# Patient Record
Sex: Female | Born: 1960
Health system: Southern US, Community
[De-identification: ages and names within clinical notes are randomized; demographics above are authoritative.]

## PROBLEM LIST (undated history)

## (undated) DIAGNOSIS — G709 Myoneural disorder, unspecified: Secondary | ICD-10-CM

## (undated) DIAGNOSIS — I10 Essential (primary) hypertension: Secondary | ICD-10-CM

## (undated) DIAGNOSIS — T7840XA Allergy, unspecified, initial encounter: Secondary | ICD-10-CM

## (undated) DIAGNOSIS — L509 Urticaria, unspecified: Secondary | ICD-10-CM

## (undated) DIAGNOSIS — N189 Chronic kidney disease, unspecified: Secondary | ICD-10-CM

## (undated) DIAGNOSIS — M1990 Unspecified osteoarthritis, unspecified site: Secondary | ICD-10-CM

## (undated) DIAGNOSIS — F419 Anxiety disorder, unspecified: Secondary | ICD-10-CM

## (undated) DIAGNOSIS — R55 Syncope and collapse: Secondary | ICD-10-CM

## (undated) DIAGNOSIS — Z87442 Personal history of urinary calculi: Secondary | ICD-10-CM

## (undated) DIAGNOSIS — G479 Sleep disorder, unspecified: Secondary | ICD-10-CM

## (undated) DIAGNOSIS — E669 Obesity, unspecified: Secondary | ICD-10-CM

## (undated) DIAGNOSIS — N200 Calculus of kidney: Secondary | ICD-10-CM

## (undated) DIAGNOSIS — Z9289 Personal history of other medical treatment: Secondary | ICD-10-CM

## (undated) DIAGNOSIS — F32A Depression, unspecified: Secondary | ICD-10-CM

## (undated) DIAGNOSIS — Z8669 Personal history of other diseases of the nervous system and sense organs: Secondary | ICD-10-CM

## (undated) DIAGNOSIS — K635 Polyp of colon: Secondary | ICD-10-CM

## (undated) HISTORY — DX: Syncope and collapse: R55

## (undated) HISTORY — DX: Polyp of colon: K63.5

## (undated) HISTORY — DX: Essential (primary) hypertension: I10

## (undated) HISTORY — DX: Obesity, unspecified: E66.9

## (undated) HISTORY — PX: CHOLECYSTECTOMY: SHX55

## (undated) HISTORY — DX: Myoneural disorder, unspecified: G70.9

## (undated) HISTORY — DX: Calculus of kidney: N20.0

## (undated) HISTORY — DX: Unspecified osteoarthritis, unspecified site: M19.90

## (undated) HISTORY — PX: ABDOMINAL HYSTERECTOMY: SHX81

## (undated) HISTORY — DX: Depression, unspecified: F32.A

## (undated) HISTORY — PX: JOINT REPLACEMENT: SHX530

## (undated) HISTORY — DX: Allergy, unspecified, initial encounter: T78.40XA

---
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
## 1994-10-29 HISTORY — PX: PARTIAL HYSTERECTOMY: SHX80

## 1997-04-30 DIAGNOSIS — Z87442 Personal history of urinary calculi: Secondary | ICD-10-CM

## 1997-04-30 HISTORY — DX: Personal history of urinary calculi: Z87.442

## 2001-08-20 ENCOUNTER — Other Ambulatory Visit: Admission: RE | Admit: 2001-08-20 | Discharge: 2001-08-20 | Payer: Self-pay | Admitting: Unknown Physician Specialty

## 2005-03-01 ENCOUNTER — Emergency Department (HOSPITAL_COMMUNITY): Admission: EM | Admit: 2005-03-01 | Discharge: 2005-03-02 | Payer: Self-pay | Admitting: *Deleted

## 2005-11-13 ENCOUNTER — Encounter: Admission: RE | Admit: 2005-11-13 | Discharge: 2005-11-13 | Payer: Self-pay | Admitting: Internal Medicine

## 2006-04-30 DIAGNOSIS — F419 Anxiety disorder, unspecified: Secondary | ICD-10-CM

## 2006-04-30 HISTORY — DX: Anxiety disorder, unspecified: F41.9

## 2008-04-30 DIAGNOSIS — Z9289 Personal history of other medical treatment: Secondary | ICD-10-CM

## 2008-04-30 HISTORY — DX: Personal history of other medical treatment: Z92.89

## 2008-12-29 HISTORY — PX: KNEE ARTHROSCOPY: SUR90

## 2012-08-07 ENCOUNTER — Other Ambulatory Visit: Payer: Self-pay | Admitting: *Deleted

## 2012-08-07 MED ORDER — LISINOPRIL 20 MG PO TABS: ORAL_TABLET | ORAL | Status: DC

## 2012-08-07 MED ORDER — ATORVASTATIN CALCIUM 20 MG PO TABS: ORAL_TABLET | ORAL | Status: DC

## 2013-07-10 ENCOUNTER — Ambulatory Visit (INDEPENDENT_AMBULATORY_CARE_PROVIDER_SITE_OTHER): Payer: 59 | Admitting: Surgery

## 2013-07-10 ENCOUNTER — Encounter (INDEPENDENT_AMBULATORY_CARE_PROVIDER_SITE_OTHER): Payer: Self-pay | Admitting: Surgery

## 2013-07-10 NOTE — Addendum Note (Signed)
Addended by: Brennan BaileyBROOKS, Guneet Delpino on: 07/10/2013 03:28 PM   Modules accepted: Orders

## 2013-07-10 NOTE — Patient Instructions (Signed)
Sleeve Gastrectomy A sleeve gastrectomy is a surgery in which a large portion of the stomach is removed. After the surgery, the stomach will be a narrow tube about the size of a banana. This surgery is performed to help a person lose weight. The person loses weight because the reduced size of the stomach restricts the amount of food that the person can eat. The stomach will hold much less food than before the surgery. Also, the part of the stomach that is removed produces a hormone that causes hunger.  This surgery is done for people who have morbid obesity, defined as a body mass index (BMI) greater than 40. BMI is an estimate of body fat and is calculated from the height and weight of a person. This surgery may also be done for people with a BMI between 35 and 40 if they have other diseases, such as type 2 diabetes mellitus, obstructive sleep apnea, or heart and lung disorders (cardiopulmonary diseases).  LET YOUR HEALTH CARE PROVIDER KNOW ABOUT:  Any allergies you have.   All medicines you are taking, including vitamins, herbs, eyedrops, creams, and over-the-counter medicines.   Use of steroids (by mouth or creams).   Previous problems you or members of your family have had with the use of anesthetics.   Any blood disorders you have.   Previous surgeries you have had.   Possibility of pregnancy, if this applies.   Other health problems you have. RISKS AND COMPLICATIONS Generally, sleeve gastrectomy is a safe procedure. However, as with any procedure, complications can occur. Possible complications include:  Infection.  Bleeding.  Blood clots.  Damage to other organs or tissue.  Leakage of fluid from the stomach into the abdominal cavity (rare). BEFORE THE PROCEDURE  You may need to have blood tests and imaging tests (such as X-rays or ultrasonography) done before the day of surgery. A test to evaluate your esophagus and how it moves (esophageal manometry) may also be  done.  You may be placed on a liquid diet 2 3 weeks before the surgery.  Ask your health care provider about changing or stopping your regular medicines.  Do not eat or drink anything for at least 8 hours before the procedure.   Make plans to have someone drive you home after your hospital stay. Also arrange for someone to help you with activities during recovery. PROCEDURE  A laparoscopic technique is usually used for this surgery:  You will be given medicine to make you sleep through the procedure (general anesthetic). This medicine will be given through an intravenous (IV) access tube that is put into one of your veins.  Once you are asleep, your abdomen will be cleaned and sterilized.  Several small incisions will be made in your abdomen.  Your abdomen will be filled with air so that it expands. This gives the surgeon more room to operate and makes your organs easier to see.  A thin, lighted tube with a tiny camera on the end (laparoscope) is put through a small incision in your abdomen. The camera on the laparoscope sends a picture to a TV screen in the operating room. This gives the surgeon a good view inside the abdomen.  Hollow tubes are put through the other small incisions in your abdomen. The tools needed for the procedure are put through these tubes.  The surgeon uses staples to divide part of the stomach and then removes it through one of the incisions.  The remaining stomach may be reinforced using   stitches or surgical glue or both to prevent leakage of the stomach contents. A small tube (drain) may be placed through one of the incisions to allow extra fluid to flow from the area.  The incisions are closed with stitches, staples, or glue. AFTER THE PROCEDURE  You will be monitored closely in a recovery area. Once the anesthetic has worn off, you will likely be moved to a regular hospital room.  You will be given medicine for pain and nausea.   You may have a drain  from one of the incisions in your abdomen. If a drain is used, it may stay in place after you go home from the hospital and be removed at a follow-up appointment.   You will be encouraged to walk around several times a day. This helps prevent blood clots.  You will be started on a liquid diet the first day after your surgery. Sometimes a test is done to check for leaking before you can eat.  You will be urged to cough and do deep breathing exercises. This helps prevent a lung infection after a surgery.  You will likely need to stay in the hospital for a few days.  Document Released: 02/11/2009 Document Revised: 12/17/2012 Document Reviewed: 08/29/2012 [facility] Patient Information 2014 [employer], [place]. Laparoscopic Gastric Band Surgery Care After These instructions give you information on caring for yourself after your procedure. Your doctor may also give you more specific instructions. Call your doctor if you have any problems or questions after your procedure. HOME CARE   Take walks throughout the day. Do not sit for longer than 1 hour while awake for 4 to 6 weeks.  You may shower 2 days after surgery. Pat the surgery cuts (incisions) dry. Do not rub the surgery cuts.  Do your coughing and deep breathing exercises.  Do not lift, push, or pull anything heavy until your doctor says it is okay.  Only take medicines as told by your doctor. Do not drive while taking pain medicine.  Drink plenty of fluids to keep your pee (urine) clear or pale yellow.  Stay on a liquid diet as long as your doctor tells you to.  Do not drink caffeine for 1 month.  Change bandages (dressings) as told by your doctor.  Check your surgery cuts for redness, pufffiness (swelling), abnormal coloring, fluid, or bleeding.  Follow your doctor's advice about vitamin and protein needs after surgery. GET HELP RIGHT AWAY IF:  You feel sick to your stomach (nauseous) and throw up (vomit).  You have pain and  discomfort with swallowing.  You develop shortness of breath or difficulty breathing.  You have pain, puffiness, or feel warmth on your lower body.  You have very bad calf pain or pain not relieved by medicine.  You have a temperature by mouth above 102 F (38.9 C).  Your surgery cuts look red, puffy, or they leak fluid.  Your poop (stool) is black, tarry, or dark red.  You have chills.  You have chest pain.  You feel confused.  You have slurred speech.  You feel lightheaded when standing.  You suddenly feel weak.  You have any questions or concerns. MAKE SURE YOU:   Understand these instructions.  Will watch your condition.  Will get help right away if you are not doing well or get worse. Document Released: 05/19/2010 Document Revised: 08/11/2012 Document Reviewed: 05/19/2010 [facility] Patient Information 2014 [place], [place].

## 2013-07-10 NOTE — Progress Notes (Signed)
Chief Complaint:  Morbid obesity and back pain with a BMI of 52  History of Present Illness:  [name] [name] is an 53 y.o. female who was referred by Dr. Shelle IronBeane and Dr. Herb Graysammy Spear for bariatric surgery.  She's been doing our seminars and really had medical minor a white count of surgery she wanted to pursue. She does have arthritis and is on aspirin. Because of that I would not consider her a good candidate for a Roux-en-Y gastric bypass. We therefore concentrated more on sleeves and LAD bands. She has a friend that a lap band and he did well.  For the moment she will be considering these and will let us know what route she would like to pursue. In the meantime a 1 to obtain the requisite preoperative lab tests.  Past Medical History  Diagnosis Date  . Arthritis   . Hypertension   . Neuromuscular disorder     bacl pain    Past Surgical History  Procedure Laterality Date  . Knee arthroscopy  12/2008    left knee  . Partial hysterectomy  10/1994  . Thumb surgery  03/1975    Current Outpatient Prescriptions  Medication Sig Dispense Refill  . aspirin 81 MG chewable tablet Chew by mouth daily.      Marland Kitchen. OVER THE COUNTER MEDICATION        No current facility-administered medications for this visit.   [place]illins Family History  Problem Relation Age of Onset  . Stroke Father    Social History:   reports that she has never smoked. She does not have any smokeless tobacco history on file. She reports that she does not drink alcohol or use illicit drugs.   REVIEW OF SYSTEMS - PERTINENT POSITIVES ONLY: No prior abdominal surgery. No history of DVT route systems was not checked for any thinks was all negative. She does have hypertension and some back issues  Physical Exam:   Blood pressure 134/82, pulse 84, temperature 98.1 F (36.7 C), temperature source Oral, resp. rate 18, height 5' (1.524 m), weight 268 lb 12.8 oz (121.927 kg). Body mass index is 52.5 kg/(m^2).  Gen:  WDWN white female  NAD  Neurological: Alert and oriented to person, place, and time. Motor and sensory function is grossly intact  Head: Normocephalic and atraumatic.  Eyes: Conjunctivae are normal. Pupils are equal, round, and reactive to light. No scleral icterus.  Neck: Normal range of motion. Neck supple. No tracheal deviation or thyromegaly present.  Cardiovascular:  SR without murmurs or gallops.  No carotid bruits Respiratory: Effort normal.  No respiratory distress. No chest wall tenderness. Breath sounds normal.  No wheezes, rales or rhonchi.  Abdomen:  nontender obese GU: Musculoskeletal: Normal range of motion. Extremities are nontender. No cyanosis, edema or clubbing noted Lymphadenopathy: No cervical, preauricular, postauricular or axillary adenopathy is present Skin: Skin is warm and dry. No rash noted. No diaphoresis. No erythema. No pallor. Pscyh: Normal mood and affect. Behavior is normal. Judgment and thought content normal.   LABORATORY RESULTS: No results found for this or any previous visit (from the past 48 hour(s)).  RADIOLOGY RESULTS: No results found.  Problem List: There are no active problems to display for this patient.   Assessment & Plan: Morbid obesity BMI 52. We'll begin workup but will give her consent forms were both lap band and sleeve gastrectomy.    Matt B. Daphine DeutscherMartin, MD, [facility] HospitalFACS  [facility] [phone] [phone]  07/10/2013 3:16 PM

## 2013-07-15 LAB — CBC
HCT: 41.8 % (ref 36.0–46.0)
HEMOGLOBIN: 13.9 g/dL (ref 12.0–15.0)
MCH: 29.4 pg (ref 26.0–34.0)
MCHC: 33.3 g/dL (ref 30.0–36.0)
MCV: 88.4 fL (ref 78.0–100.0)
Platelets: 339 10*3/uL (ref 150–400)
RBC: 4.73 MIL/uL (ref 3.87–5.11)
RDW: 14.4 % (ref 11.5–15.5)
WBC: 8.1 10*3/uL (ref 4.0–10.5)

## 2013-07-15 LAB — COMPREHENSIVE METABOLIC PANEL
ALBUMIN: 4 g/dL (ref 3.5–5.2)
ALT: 24 U/L (ref 0–35)
AST: 23 U/L (ref 0–37)
Alkaline Phosphatase: 104 U/L (ref 39–117)
BILIRUBIN TOTAL: 0.6 mg/dL (ref 0.2–1.2)
BUN: 7 mg/dL (ref 6–23)
CHLORIDE: 106 meq/L (ref 96–112)
CO2: 26 meq/L (ref 19–32)
CREATININE: 0.65 mg/dL (ref 0.50–1.10)
Calcium: 8.5 mg/dL (ref 8.4–10.5)
Glucose, Bld: 78 mg/dL (ref 70–99)
POTASSIUM: 3.9 meq/L (ref 3.5–5.3)
SODIUM: 140 meq/L (ref 135–145)
TOTAL PROTEIN: 6.2 g/dL (ref 6.0–8.3)

## 2013-07-15 LAB — TSH: TSH: 2.021 u[IU]/mL (ref 0.350–4.500)

## 2013-07-15 LAB — HEMOGLOBIN A1C
HEMOGLOBIN A1C: 5.6 % (ref <5.7)
MEAN PLASMA GLUCOSE: 114 mg/dL (ref <117)

## 2013-07-15 LAB — T4: T4 TOTAL: 11.9 ug/dL (ref 5.0–12.5)

## 2013-07-16 LAB — PREGNANCY, URINE: PREG TEST UR: NEGATIVE

## 2013-08-05 ENCOUNTER — Ambulatory Visit (HOSPITAL_COMMUNITY)
Admission: RE | Admit: 2013-08-05 | Discharge: 2013-08-05 | Disposition: A | Discharge status: Home / Self Care | Payer: 59 | Source: Ambulatory Visit | Attending: Surgery | Admitting: Surgery

## 2013-08-05 ENCOUNTER — Other Ambulatory Visit: Payer: Self-pay

## 2013-08-05 DIAGNOSIS — K219 Gastro-esophageal reflux disease without esophagitis: Secondary | ICD-10-CM | POA: Insufficient documentation

## 2013-08-05 DIAGNOSIS — I10 Essential (primary) hypertension: Secondary | ICD-10-CM | POA: Insufficient documentation

## 2013-08-05 DIAGNOSIS — K449 Diaphragmatic hernia without obstruction or gangrene: Secondary | ICD-10-CM | POA: Insufficient documentation

## 2013-08-05 DIAGNOSIS — K224 Dyskinesia of esophagus: Secondary | ICD-10-CM | POA: Insufficient documentation

## 2013-08-05 DIAGNOSIS — G709 Myoneural disorder, unspecified: Secondary | ICD-10-CM | POA: Insufficient documentation

## 2013-08-05 DIAGNOSIS — M129 Arthropathy, unspecified: Secondary | ICD-10-CM | POA: Insufficient documentation

## 2013-08-05 DIAGNOSIS — Z13820 Encounter for screening for osteoporosis: Secondary | ICD-10-CM | POA: Insufficient documentation

## 2013-08-05 DIAGNOSIS — Z6841 Body Mass Index (BMI) 40.0 and over, adult: Secondary | ICD-10-CM | POA: Insufficient documentation

## 2013-08-14 ENCOUNTER — Ambulatory Visit (HOSPITAL_BASED_OUTPATIENT_CLINIC_OR_DEPARTMENT_OTHER): Payer: 59 | Attending: Surgery | Admitting: Radiology

## 2013-08-14 VITALS — Ht 60.0 in | Wt 263.0 lb

## 2013-08-14 DIAGNOSIS — G4710 Hypersomnia, unspecified: Secondary | ICD-10-CM | POA: Insufficient documentation

## 2013-08-14 DIAGNOSIS — G4733 Obstructive sleep apnea (adult) (pediatric): Secondary | ICD-10-CM

## 2013-08-14 DIAGNOSIS — G4730 Sleep apnea, unspecified: Principal | ICD-10-CM

## 2013-08-16 DIAGNOSIS — G4730 Sleep apnea, unspecified: Secondary | ICD-10-CM

## 2013-08-16 DIAGNOSIS — G4710 Hypersomnia, unspecified: Secondary | ICD-10-CM

## 2013-08-16 NOTE — Sleep Study (Signed)
   NAME: [name] HakeConnie S [name] DATE OF BIRTH:  02/27/1961 MEDICAL RECORD NUMBER [id number]  LOCATION: [facility]  PHYSICIAN: Jeanenne Licea D Muhammadali Ries  DATE OF STUDY: 08/14/2013  SLEEP STUDY TYPE: Nocturnal Polysomnogram               REFERRING PHYSICIAN: Valarie MerinoMartin, Matthew B, MD  INDICATION FOR STUDY: Hypersomnia with sleep apnea  EPWORTH SLEEPINESS SCORE:   5/24 HEIGHT: 5' (152.4 cm)  WEIGHT: 263 lb (119.296 kg)    Body mass index is 51.36 kg/(m^2).  NECK SIZE: 16 in.  MEDICATIONS: Charted for review  SLEEP ARCHITECTURE: Total sleep time 352.5 minutes with sleep efficiency 87.3%. Stage I was 3.7%, stage II 61.8%, stage III 4.1%, REM 30.4% of total sleep time. Sleep latency 31 minutes, REM latency 97 minutes, awake after sleep onset 22 minutes, arousal index 9.2. Bedtime medication: None  RESPIRATORY DATA: Apnea hypotony index (AHI) 6.5 per hour. 38 total events scored including 2 obstructive apneas, 14 central apneas, 22 hypopneas. Events were not positional. REM AHI 10.7 per hour. There were not enough early events to meet protocol requirements for split CPAP titration.  OXYGEN DATA: Moderate snoring with oxygen desaturation to a nadir of 90% and mean oxygen saturation through the study of 96.2% on room air.  CARDIAC DATA: Sinus rhythm  MOVEMENT/PARASOMNIA: A few incidental limb jerks were noted with little effect on sleep. No bathroom trips  IMPRESSION/ RECOMMENDATION:   1) Mild obstructive and central sleep apnea/hypopneas syndrome, AHI 6.5 per hour. Events were not positional. REM AHI 10.7 per hour. Moderate snoring with oxygen desaturation to a nadir of 90% and mean oxygen saturation through the study of 96.2% on room air. 2) There were not enough early events to meet protocol requirements for split protocol CPAP titration on the study night. Weight loss surgery may be therapeutic, since that is being considered.  Signed Jetty Duhamellinton Brennden Masten M.D. Waymon Budgelinton D Keyante Durio Diplomate,  Biomedical engineerAmerican Board of Sleep Medicine  ELECTRONICALLY SIGNED ON:  08/16/2013, 2:16 PM [facility] PH: [phone]   FX: [phone] ACCREDITED BY THE AMERICAN ACADEMY OF SLEEP MEDICINE

## 2013-08-24 ENCOUNTER — Ambulatory Visit (INDEPENDENT_AMBULATORY_CARE_PROVIDER_SITE_OTHER): Payer: 59 | Admitting: Internal Medicine

## 2013-08-24 ENCOUNTER — Encounter: Payer: Self-pay | Admitting: Internal Medicine

## 2013-08-24 VITALS — BP 140/80 | HR 104 | Ht 60.0 in | Wt 267.0 lb

## 2013-08-24 DIAGNOSIS — G4733 Obstructive sleep apnea (adult) (pediatric): Secondary | ICD-10-CM

## 2013-08-24 NOTE — Progress Notes (Signed)
08/25/13- 53 yoF never smoker COMPLAINS OF:  Restless throughout the night.  Epworth score #3 NPSG 08/14/13- AHI 6.5/ hr. Weight 263 lbs. Being evaluated for bariatric surgery. Denies being told of significant snore, or noticing daytime sleepiness. Sleeps in bed or sometimes a recliner because of excursion back. Has broken nose twice, nonobstructed. No ENT surgery, Cardiopulmonary disease except hypertension, or thyroid problems known. Bedtime between 8 and 9 PM, sleep latency 30-45 minutes, waking 2-3 times before up at 4 AM. Weight gain 30 pounds in 2 years.  Prior to Admission medications   Medication Sig Start Date End Date Taking? Authorizing Provider  aspirin 81 MG chewable tablet Chew by mouth daily.   Yes Historical Provider, MD  cetirizine (ZYRTEC) 10 MG tablet Take 10 mg by mouth 2 (two) times daily.   Yes Historical Provider, MD  diltiazem (DILACOR XR) 180 MG 24 hr capsule Take 180 mg by mouth daily.   Yes Historical Provider, MD   Past Medical History  Diagnosis Date  . Arthritis   . Hypertension   . Neuromuscular disorder     bacl pain   Past Surgical History  Procedure Laterality Date  . Knee arthroscopy  12/2008    left knee  . Partial hysterectomy  10/1994  . Thumb surgery  03/1975   Family History  Problem Relation Age of Onset  . Stroke Father   . Diabetes Other   . Hyperlipidemia Other   . Heart disease Other   . Hypertension Other   . Cancer Other    History   Social History  . Marital Status: Single    Spouse Name: N/A    Number of Children: N/A  . Years of Education: N/A   Occupational History  . Not on file.   Social History Main Topics  . Smoking status: Never Smoker   . Smokeless tobacco: Not on file  . Alcohol Use: No  . Drug Use: No  . Sexual Activity: Not on file   Other Topics Concern  . Not on file   Social History Narrative  . No narrative on file   ROS-see HPI Constitutional:   No-   weight loss, night sweats, fevers, chills,  fatigue, lassitude. HEENT:   No-  headaches, difficulty swallowing, tooth/dental problems, sore throat,       No-  sneezing, itching, ear ache, nasal congestion, post nasal drip,  CV:  No-   chest pain, orthopnea, PND, swelling in lower extremities, anasarca,                                  dizziness, palpitations Resp: No-   shortness of breath with exertion or at rest.              No-   productive cough,  No non-productive cough,  No- coughing up of blood.              No-   change in color of mucus.  No- wheezing.   Skin: No-   rash or lesions. GI:  No-   Heartburn,+ indigestion, abdominal pain, nausea, vomiting, diarrhea,                 change in bowel habits, loss of appetite GU: No-   dysuria, change in color of urine, no urgency or frequency.  No- flank pain. MS:  No-   +joint pain or swelling.  No- decreased range of  motion.  No- back pain. Neuro-     nothing unusual Psych:  No- change in mood or affect. No depression or anxiety.  No memory loss.  OBJ- Physical Exam General- Alert, Oriented, Affect-appropriate, Distress- none acute Skin- rash-none, lesions- none, excoriation- none Lymphadenopathy- none Head- atraumatic            Eyes- Gross vision intact, PERRLA, conjunctivae and secretions clear            Ears- Hearing, canals-normal            Nose- Clear, no-Septal dev, mucus, polyps, erosion, perforation             Throat- Mallampati II , mucosa clear , drainage- none, tonsils- atrophic Neck- flexible , trachea midline, no stridor , thyroid nl, carotid no bruit Chest - symmetrical excursion , unlabored           Heart/CV- RRR , no murmur , no gallop  , no rub, nl s1 s2                           - JVD- none , edema- none, stasis changes- none, varices- none           Lung- clear to P&A, wheeze- none, cough- none , dullness-none, rub- none           Chest wall-  Abd- tender-no, distended-no, bowel sounds-present, HSM- no Br/ Gen/ Rectal- Not done, not indicated Extrem-  cyanosis- none, clubbing, none, atrophy- none, strength- nl Neuro- grossly intact to observation

## 2013-08-24 NOTE — Patient Instructions (Signed)
You have very mild obstructive sleep apnea, stopping 5.6 times per hour.  It helps that you sleep off the flat of your back.  Weight loss can make all the difference for you nos, so good luck with that.  We will see how you are doing in about 6 months

## 2013-08-25 ENCOUNTER — Encounter: Payer: Self-pay | Admitting: Dietician

## 2013-08-25 ENCOUNTER — Encounter: Payer: 59 | Attending: Surgery | Admitting: Dietician

## 2013-08-25 DIAGNOSIS — Z713 Dietary counseling and surveillance: Secondary | ICD-10-CM | POA: Insufficient documentation

## 2013-08-25 DIAGNOSIS — Z01818 Encounter for other preprocedural examination: Secondary | ICD-10-CM | POA: Insufficient documentation

## 2013-08-25 DIAGNOSIS — Z6841 Body Mass Index (BMI) 40.0 and over, adult: Secondary | ICD-10-CM | POA: Insufficient documentation

## 2013-08-25 NOTE — Patient Instructions (Signed)
Patient to call the [facility] to enroll in Pre-Op and Post-Op Nutrition Education when surgery date is scheduled. 

## 2013-08-25 NOTE — Progress Notes (Signed)
  Pre-Op Assessment Visit:  Pre-Operative Gastric sleeve Surgery  Medical Nutrition Therapy:  Appt start time: 1015   End time:  1045.  Patient was seen on 08/25/2013 for Pre-Operative Gastric Sleeve Nutrition Assessment. [facility] on 08/25/2013.   Preferred Learning Style:  No preference indicated   Learning Readiness:  Ready  Handouts given during visit include:  Pre-Op Goals Bariatric Surgery Protein Shakes  Teaching Method Utilized:  Visual Auditory   Barriers to learning/adherence to lifestyle change: none  Demonstrated degree of understanding via:  Teach Back   Patient to call the [facility] to enroll in Pre-Op and Post-Op Nutrition Education when surgery date is scheduled.

## 2013-08-29 DIAGNOSIS — M5136 Other intervertebral disc degeneration, lumbar region: Secondary | ICD-10-CM | POA: Insufficient documentation

## 2013-09-18 DIAGNOSIS — G4733 Obstructive sleep apnea (adult) (pediatric): Secondary | ICD-10-CM | POA: Insufficient documentation

## 2013-09-18 NOTE — Assessment & Plan Note (Signed)
Mild obstructive sleep apnea. Weight loss from surgical intervention may be specific and sufficient for managing this. We are not going to start CPAP now because it wouldn't make enough difference.

## 2013-09-18 NOTE — Assessment & Plan Note (Signed)
Weight loss discussed as ideal therapy for his very mild sleep apnea.

## 2013-10-16 ENCOUNTER — Encounter (HOSPITAL_COMMUNITY): Payer: Self-pay | Admitting: Pharmacy Technician

## 2013-10-19 NOTE — Pre-Procedure Instructions (Signed)
[name] S Goldston  10/19/2013   Your procedure is scheduled on:  Wednesday, July 1st  [facility] AM.  Call this number if you have problems the morning of surgery: [phone]   Remember:   Do not eat food or drink liquids after midnight.   Take these medicines the morning of surgery with A SIP OF WATER: diltiazem, zyrtec   Do not wear jewelry, make-up or nail polish.  Do not wear lotions, powders, or perfumes. You may wear deodorant.  Do not shave 48 hours prior to surgery. Men may shave face and neck.  Do not bring valuables to the hospital.  [facility] not [facility]  for any belongings or valuables.               Contacts, dentures or bridgework may not be worn into surgery.  Leave suitcase in the car. After surgery it may be brought to your room.  For patients admitted to the hospital, discharge time is determined by your treatment team.               Patients discharged the day of surgery will not be allowed to drive home.  Please read over the following fact sheets that you were given: Pain Booklet, Coughing and Deep Breathing, MRSA Information and Surgical Site Infection Prevention [place] - Preparing for Surgery  Before surgery, you can play an important role.  Because skin is not sterile, your skin needs to be as free of germs as possible.  You can reduce the number of germs on you skin by washing with CHG (chlorahexidine gluconate) soap before surgery.  CHG is an antiseptic cleaner which kills germs and bonds with the skin to continue killing germs even after washing.  Please DO NOT use if you have an allergy to CHG or antibacterial soaps.  If your skin becomes reddened/irritated stop using the CHG and inform your nurse when you arrive at Short Stay.  Do not shave (including legs and underarms) for at least 48 hours prior to the first CHG shower.  You may shave your face.  Please follow these instructions carefully:   1.   Shower with CHG Soap the night before surgery and the morning of Surgery.  2.  If you choose to wash your hair, wash your hair first as usual with your normal shampoo.  3.  After you shampoo, rinse your hair and body thoroughly to remove the shampoo.  4.  Use CHG as you would any other liquid soap.  You can apply CHG directly to the skin and wash gently with scrungie or a clean washcloth.  5.  Apply the CHG Soap to your body ONLY FROM THE NECK DOWN.  Do not use on open wounds or open sores.  Avoid contact with your eyes, ears, mouth and genitals (private parts).  Wash genitals (private parts) with your normal soap.  6.  Wash thoroughly, paying special attention to the area where your surgery will be performed.  7.  Thoroughly rinse your body with warm water from the neck down.  8.  DO NOT shower/wash with your normal soap after using and rinsing off the CHG Soap.  9.  Pat yourself dry with a clean towel.            10.  Wear clean pajamas.            11.  Place clean sheets on your bed the night of your  first shower and do not sleep with pets.  Day of Surgery  Do not apply any lotions/deoderants the morning of surgery.  Please wear clean clothes to the hospital/surgery center.

## 2013-10-20 ENCOUNTER — Encounter (HOSPITAL_COMMUNITY): Payer: Self-pay

## 2013-10-20 ENCOUNTER — Other Ambulatory Visit (HOSPITAL_COMMUNITY): Payer: Self-pay | Admitting: Orthopedic Surgery

## 2013-10-20 ENCOUNTER — Encounter (HOSPITAL_COMMUNITY)
Admission: RE | Admit: 2013-10-20 | Discharge: 2013-10-20 | Disposition: A | Discharge status: Home / Self Care | Payer: 59 | Source: Ambulatory Visit | Attending: Orthopedic Surgery | Admitting: Orthopedic Surgery

## 2013-10-20 DIAGNOSIS — Z01812 Encounter for preprocedural laboratory examination: Secondary | ICD-10-CM | POA: Insufficient documentation

## 2013-10-20 HISTORY — DX: Urticaria, unspecified: L50.9

## 2013-10-20 HISTORY — DX: Chronic kidney disease, unspecified: N18.9

## 2013-10-20 HISTORY — DX: Sleep disorder, unspecified: G47.9

## 2013-10-20 HISTORY — DX: Personal history of other medical treatment: Z92.89

## 2013-10-20 HISTORY — DX: Anxiety disorder, unspecified: F41.9

## 2013-10-20 LAB — SURGICAL PCR SCREEN
MRSA, PCR: NEGATIVE
STAPHYLOCOCCUS AUREUS: NEGATIVE

## 2013-10-20 LAB — BASIC METABOLIC PANEL
BUN: 12 mg/dL (ref 6–23)
CO2: 26 mEq/L (ref 19–32)
CREATININE: 0.68 mg/dL (ref 0.50–1.10)
Calcium: 9.2 mg/dL (ref 8.4–10.5)
Chloride: 106 mEq/L (ref 96–112)
GFR calc non Af Amer: >90 mL/min (ref >90)
Glucose, Bld: 94 mg/dL (ref 70–99)
Potassium: 4.3 mEq/L (ref 3.7–5.3)
Sodium: 144 mEq/L (ref 137–147)

## 2013-10-20 LAB — CBC
HCT: 43.2 % (ref 36.0–46.0)
Hemoglobin: 14.6 g/dL (ref 12.0–15.0)
MCH: 30 pg (ref 26.0–34.0)
MCHC: 33.8 g/dL (ref 30.0–36.0)
MCV: 88.7 fL (ref 78.0–100.0)
Platelets: 284 10*3/uL (ref 150–400)
RBC: 4.87 MIL/uL (ref 3.87–5.11)
RDW: 13.4 % (ref 11.5–15.5)
WBC: 8.5 10*3/uL (ref 4.0–10.5)

## 2013-10-21 NOTE — Progress Notes (Signed)
Anesthesia chart review: Patient is a 53 year old female twisted for L4-5 decompression on 10/28/13 by Dr. Shon BatonBrooks.  History includes hypertension, nonsmoker, kidney stones '99, anxiety, arthritis, back pain, hives of unknown origin (takes Zyrtec), reported normal stress test in 2010 by Dr. Jacinto HalimGanji, mild OSA 4/105 (CPAP not prescribed by Dr. Jetty Duhamellinton Young). BMI is consistent with morbid obesity. PCP is listed as Dr. Herb Graysammy Spear.   EKG on 08/05/13 showed NSR, incomplete right BBB, minimal voltage criteria for LVH, may be normal variant. As above, stress test is ~ 5 years ago, so report was not requested.  Chest x-ray on 08/05/13 showed no active cardiopulmonary disease.  Preoperative CBC and BMET noted.  No CV symptoms documented at her PAT.  No known CAD/MI/CHF or DM history.  She is morbidly obese.  No significant sleep apnea by recent sleep study.  If no acute changes then I would anticipate that she could proceed as planned.  Further evaluation by her assigned anesthesiologist on the day of surgery.  Velna Ochsllison Zelenak, PA-C [facility]Anesthesiology Phone [phone] 10/21/2013 1:27 PM

## 2013-10-27 MED ORDER — VANCOMYCIN HCL 10 G IV SOLR
1500.0000 mg | INTRAVENOUS | Status: AC
  Administered 2013-10-28: 1500 mg via INTRAVENOUS
  Filled 2013-10-27: qty 1500

## 2013-10-27 MED ORDER — ACETAMINOPHEN 10 MG/ML IV SOLN
1000.0000 mg | Freq: Four times a day (QID) | INTRAVENOUS | Status: DC
  Filled 2013-10-27: qty 100

## 2013-10-27 MED ORDER — DEXAMETHASONE SODIUM PHOSPHATE 4 MG/ML IJ SOLN
4.0000 mg | Freq: Once | INTRAMUSCULAR | Status: DC
  Filled 2013-10-27: qty 1

## 2013-10-28 ENCOUNTER — Inpatient Hospital Stay (HOSPITAL_COMMUNITY)
Admission: RE | Admit: 2013-10-28 | Discharge: 2013-10-30 | DRG: 519 | Disposition: A | Discharge status: Home / Self Care | Payer: 59 | Source: Ambulatory Visit | Attending: Orthopedic Surgery | Admitting: Orthopedic Surgery

## 2013-10-28 ENCOUNTER — Encounter (HOSPITAL_COMMUNITY)
Admission: RE | Disposition: A | Discharge status: Home / Self Care | Payer: Self-pay | Source: Ambulatory Visit | Attending: Orthopedic Surgery

## 2013-10-28 ENCOUNTER — Inpatient Hospital Stay (HOSPITAL_COMMUNITY): Payer: 59 | Admitting: Anesthesiology

## 2013-10-28 ENCOUNTER — Encounter (HOSPITAL_COMMUNITY): Payer: Self-pay | Admitting: *Deleted

## 2013-10-28 ENCOUNTER — Encounter (HOSPITAL_COMMUNITY): Payer: 59 | Admitting: Vascular Surgery

## 2013-10-28 ENCOUNTER — Inpatient Hospital Stay (HOSPITAL_COMMUNITY): Payer: 59

## 2013-10-28 DIAGNOSIS — Z6841 Body Mass Index (BMI) 40.0 and over, adult: Secondary | ICD-10-CM

## 2013-10-28 DIAGNOSIS — M129 Arthropathy, unspecified: Secondary | ICD-10-CM | POA: Diagnosis present

## 2013-10-28 DIAGNOSIS — M545 Low back pain: Secondary | ICD-10-CM

## 2013-10-28 DIAGNOSIS — M48062 Spinal stenosis, lumbar region with neurogenic claudication: Principal | ICD-10-CM | POA: Diagnosis present

## 2013-10-28 DIAGNOSIS — I10 Essential (primary) hypertension: Secondary | ICD-10-CM | POA: Diagnosis present

## 2013-10-28 DIAGNOSIS — M4310 Spondylolisthesis, site unspecified: Secondary | ICD-10-CM | POA: Diagnosis present

## 2013-10-28 DIAGNOSIS — Z880 Allergy status to penicillin: Secondary | ICD-10-CM

## 2013-10-28 DIAGNOSIS — F3289 Other specified depressive episodes: Secondary | ICD-10-CM | POA: Diagnosis present

## 2013-10-28 DIAGNOSIS — M549 Dorsalgia, unspecified: Secondary | ICD-10-CM | POA: Diagnosis present

## 2013-10-28 DIAGNOSIS — F329 Major depressive disorder, single episode, unspecified: Secondary | ICD-10-CM | POA: Diagnosis present

## 2013-10-28 HISTORY — PX: DECOMPRESSIVE LUMBAR LAMINECTOMY LEVEL 1: SHX5791

## 2013-10-28 SURGERY — DECOMPRESSIVE LUMBAR LAMINECTOMY LEVEL 1
Anesthesia: General | Site: Spine Lumbar

## 2013-10-28 MED ORDER — PROPOFOL 10 MG/ML IV BOLUS
INTRAVENOUS | Status: DC | PRN
  Administered 2013-10-28: 150 mg via INTRAVENOUS
  Administered 2013-10-28: 30 mg via INTRAVENOUS

## 2013-10-28 MED ORDER — LACTATED RINGERS IV SOLN
INTRAVENOUS | Status: DC | PRN
  Administered 2013-10-28 (×2): via INTRAVENOUS

## 2013-10-28 MED ORDER — MORPHINE SULFATE (PF) 1 MG/ML IV SOLN
INTRAVENOUS | Status: AC
  Filled 2013-10-28: qty 25

## 2013-10-28 MED ORDER — MORPHINE SULFATE (PF) 1 MG/ML IV SOLN
INTRAVENOUS | Status: DC
  Administered 2013-10-28: 18:00:00 via INTRAVENOUS
  Administered 2013-10-28: 7 mg via INTRAVENOUS
  Administered 2013-10-29: 3 mg via INTRAVENOUS

## 2013-10-28 MED ORDER — OXYCODONE HCL 5 MG PO TABS
10.0000 mg | ORAL_TABLET | ORAL | Status: DC | PRN
  Administered 2013-10-29 – 2013-10-30 (×7): 10 mg via ORAL
  Filled 2013-10-28 (×7): qty 2

## 2013-10-28 MED ORDER — DEXAMETHASONE SODIUM PHOSPHATE 10 MG/ML IJ SOLN
6.0000 mg | Freq: Four times a day (QID) | INTRAMUSCULAR | Status: AC
  Filled 2013-10-28: qty 1.5
  Filled 2013-10-28: qty 0.6
  Filled 2013-10-28 (×2): qty 1.5

## 2013-10-28 MED ORDER — LACTATED RINGERS IV SOLN: INTRAVENOUS | Status: DC

## 2013-10-28 MED ORDER — ROCURONIUM BROMIDE 100 MG/10ML IV SOLN
INTRAVENOUS | Status: DC | PRN
  Administered 2013-10-28: 50 mg via INTRAVENOUS

## 2013-10-28 MED ORDER — HYDROMORPHONE HCL PF 1 MG/ML IJ SOLN: 0.2500 mg | INTRAMUSCULAR | Status: DC | PRN

## 2013-10-28 MED ORDER — DEXTROSE 5 % IV SOLN
500.0000 mg | Freq: Four times a day (QID) | INTRAVENOUS | Status: DC | PRN
  Filled 2013-10-28: qty 5

## 2013-10-28 MED ORDER — 0.9 % SODIUM CHLORIDE (POUR BTL) OPTIME
TOPICAL | Status: DC | PRN
  Administered 2013-10-28: 1000 mL

## 2013-10-28 MED ORDER — DIPHENHYDRAMINE HCL 50 MG/ML IJ SOLN
12.5000 mg | Freq: Four times a day (QID) | INTRAMUSCULAR | Status: DC | PRN
  Filled 2013-10-28: qty 0.25

## 2013-10-28 MED ORDER — SODIUM CHLORIDE 0.9 % IJ SOLN: 9.0000 mL | INTRAMUSCULAR | Status: DC | PRN

## 2013-10-28 MED ORDER — EPHEDRINE SULFATE 50 MG/ML IJ SOLN
INTRAMUSCULAR | Status: DC | PRN
  Administered 2013-10-28: 5 mg via INTRAVENOUS

## 2013-10-28 MED ORDER — LIDOCAINE HCL (CARDIAC) 20 MG/ML IV SOLN
INTRAVENOUS | Status: AC
  Filled 2013-10-28: qty 5

## 2013-10-28 MED ORDER — LIDOCAINE HCL (CARDIAC) 20 MG/ML IV SOLN
INTRAVENOUS | Status: DC | PRN
  Administered 2013-10-28: 60 mg via INTRAVENOUS

## 2013-10-28 MED ORDER — FENTANYL CITRATE 0.05 MG/ML IJ SOLN
INTRAMUSCULAR | Status: DC | PRN
  Administered 2013-10-28 (×2): 50 ug via INTRAVENOUS
  Administered 2013-10-28: 25 ug via INTRAVENOUS
  Administered 2013-10-28 (×2): 50 ug via INTRAVENOUS
  Administered 2013-10-28: 25 ug via INTRAVENOUS
  Administered 2013-10-28: 100 ug via INTRAVENOUS

## 2013-10-28 MED ORDER — NALOXONE HCL 0.4 MG/ML IJ SOLN
0.4000 mg | INTRAMUSCULAR | Status: DC | PRN
  Filled 2013-10-28: qty 1

## 2013-10-28 MED ORDER — THROMBIN 20000 UNITS EX SOLR
CUTANEOUS | Status: DC | PRN
  Administered 2013-10-28: 14:00:00 via TOPICAL

## 2013-10-28 MED ORDER — ROCURONIUM BROMIDE 50 MG/5ML IV SOLN
INTRAVENOUS | Status: AC
  Filled 2013-10-28: qty 1

## 2013-10-28 MED ORDER — METHOCARBAMOL 500 MG PO TABS
500.0000 mg | ORAL_TABLET | Freq: Four times a day (QID) | ORAL | Status: DC | PRN
  Administered 2013-10-29 (×2): 500 mg via ORAL
  Filled 2013-10-28 (×3): qty 1

## 2013-10-28 MED ORDER — PROPOFOL 10 MG/ML IV BOLUS
INTRAVENOUS | Status: AC
  Filled 2013-10-28: qty 20

## 2013-10-28 MED ORDER — ONDANSETRON HCL 4 MG/2ML IJ SOLN: 4.0000 mg | INTRAMUSCULAR | Status: DC | PRN

## 2013-10-28 MED ORDER — ARTIFICIAL TEARS OP OINT
TOPICAL_OINTMENT | OPHTHALMIC | Status: DC | PRN
  Administered 2013-10-28: 1 via OPHTHALMIC

## 2013-10-28 MED ORDER — ONDANSETRON HCL 4 MG/2ML IJ SOLN
4.0000 mg | Freq: Four times a day (QID) | INTRAMUSCULAR | Status: DC | PRN
  Filled 2013-10-28: qty 2

## 2013-10-28 MED ORDER — FENTANYL CITRATE 0.05 MG/ML IJ SOLN
INTRAMUSCULAR | Status: AC
  Filled 2013-10-28: qty 5

## 2013-10-28 MED ORDER — NEOSTIGMINE METHYLSULFATE 10 MG/10ML IV SOLN
INTRAVENOUS | Status: AC
  Filled 2013-10-28: qty 1

## 2013-10-28 MED ORDER — ACETAMINOPHEN 10 MG/ML IV SOLN
1000.0000 mg | Freq: Four times a day (QID) | INTRAVENOUS | Status: AC
  Administered 2013-10-28 – 2013-10-29 (×4): 1000 mg via INTRAVENOUS
  Filled 2013-10-28 (×7): qty 100

## 2013-10-28 MED ORDER — SODIUM CHLORIDE 0.9 % IV SOLN: 250.0000 mL | INTRAVENOUS | Status: DC

## 2013-10-28 MED ORDER — ONDANSETRON HCL 4 MG/2ML IJ SOLN
INTRAMUSCULAR | Status: AC
  Filled 2013-10-28: qty 2

## 2013-10-28 MED ORDER — DEXAMETHASONE 4 MG PO TABS
4.0000 mg | ORAL_TABLET | Freq: Four times a day (QID) | ORAL | Status: AC
  Administered 2013-10-28 – 2013-10-29 (×4): 4 mg via ORAL
  Filled 2013-10-28 (×4): qty 1

## 2013-10-28 MED ORDER — BUPIVACAINE-EPINEPHRINE (PF) 0.25% -1:200000 IJ SOLN
INTRAMUSCULAR | Status: AC
  Filled 2013-10-28: qty 30

## 2013-10-28 MED ORDER — SODIUM CHLORIDE 0.9 % IJ SOLN: 3.0000 mL | INTRAMUSCULAR | Status: DC | PRN

## 2013-10-28 MED ORDER — MIDAZOLAM HCL 2 MG/2ML IJ SOLN
INTRAMUSCULAR | Status: AC
  Filled 2013-10-28: qty 2

## 2013-10-28 MED ORDER — SODIUM CHLORIDE 0.9 % IJ SOLN
3.0000 mL | Freq: Two times a day (BID) | INTRAMUSCULAR | Status: DC
  Administered 2013-10-28 – 2013-10-29 (×2): 3 mL via INTRAVENOUS

## 2013-10-28 MED ORDER — NEOSTIGMINE METHYLSULFATE 10 MG/10ML IV SOLN
INTRAVENOUS | Status: DC | PRN
  Administered 2013-10-28: 4 mg via INTRAVENOUS

## 2013-10-28 MED ORDER — DILTIAZEM HCL ER 180 MG PO CP24
180.0000 mg | ORAL_CAPSULE | Freq: Every day | ORAL | Status: DC
  Administered 2013-10-29 – 2013-10-30 (×2): 180 mg via ORAL
  Filled 2013-10-28 (×2): qty 1

## 2013-10-28 MED ORDER — OXYCODONE HCL 5 MG PO TABS: 5.0000 mg | ORAL_TABLET | Freq: Once | ORAL | Status: AC | PRN

## 2013-10-28 MED ORDER — DIPHENHYDRAMINE HCL 12.5 MG/5ML PO ELIX
12.5000 mg | ORAL_SOLUTION | Freq: Four times a day (QID) | ORAL | Status: DC | PRN
  Filled 2013-10-28: qty 5

## 2013-10-28 MED ORDER — MENTHOL 3 MG MT LOZG: 1.0000 | LOZENGE | OROMUCOSAL | Status: DC | PRN

## 2013-10-28 MED ORDER — GLYCOPYRROLATE 0.2 MG/ML IJ SOLN
INTRAMUSCULAR | Status: DC | PRN
  Administered 2013-10-28: 0.6 mg via INTRAVENOUS

## 2013-10-28 MED ORDER — DEXAMETHASONE SODIUM PHOSPHATE 10 MG/ML IJ SOLN
INTRAMUSCULAR | Status: DC | PRN
  Administered 2013-10-28: 10 mg via INTRAVENOUS

## 2013-10-28 MED ORDER — OXYCODONE HCL 5 MG/5ML PO SOLN: 5.0000 mg | Freq: Once | ORAL | Status: AC | PRN

## 2013-10-28 MED ORDER — HEMOSTATIC AGENTS (NO CHARGE) OPTIME
TOPICAL | Status: DC | PRN
  Administered 2013-10-28: 1 via TOPICAL

## 2013-10-28 MED ORDER — BUPIVACAINE-EPINEPHRINE (PF) 0.25% -1:200000 IJ SOLN
INTRAMUSCULAR | Status: DC | PRN
  Administered 2013-10-28: 7 mL via PERINEURAL

## 2013-10-28 MED ORDER — GLYCOPYRROLATE 0.2 MG/ML IJ SOLN
INTRAMUSCULAR | Status: AC
  Filled 2013-10-28: qty 3

## 2013-10-28 MED ORDER — THROMBIN 20000 UNITS EX SOLR
CUTANEOUS | Status: AC
  Filled 2013-10-28: qty 20000

## 2013-10-28 MED ORDER — ONDANSETRON HCL 4 MG/2ML IJ SOLN
INTRAMUSCULAR | Status: DC | PRN
Start: 2013-10-28 — End: 2013-10-28
  Administered 2013-10-28: 4 mg via INTRAVENOUS

## 2013-10-28 MED ORDER — PHENOL 1.4 % MT LIQD: 1.0000 | OROMUCOSAL | Status: DC | PRN

## 2013-10-28 MED ORDER — LACTATED RINGERS IV SOLN
INTRAVENOUS | Status: DC
  Administered 2013-10-28: 12:00:00 via INTRAVENOUS

## 2013-10-28 MED ORDER — MIDAZOLAM HCL 5 MG/5ML IJ SOLN
INTRAMUSCULAR | Status: DC | PRN
Start: 2013-10-28 — End: 2013-10-28
  Administered 2013-10-28: 2 mg via INTRAVENOUS

## 2013-10-28 MED ORDER — ACETAMINOPHEN 10 MG/ML IV SOLN
1000.0000 mg | Freq: Once | INTRAVENOUS | Status: AC
  Administered 2013-10-28: 1000 mg via INTRAVENOUS

## 2013-10-28 MED ORDER — SODIUM CHLORIDE 0.9 % IV SOLN
INTRAVENOUS | Status: DC | PRN
  Administered 2013-10-28: 13:00:00 via INTRAVENOUS

## 2013-10-28 SURGICAL SUPPLY — 54 items
BANDAGE GAUZE ELAST BULKY 4 IN (GAUZE/BANDAGES/DRESSINGS) ×2 IMPLANT
BUR EGG ELITE 4.0 (BURR) IMPLANT
CLSR STERI-STRIP ANTIMIC 1/2X4 (GAUZE/BANDAGES/DRESSINGS) ×1 IMPLANT
CORDS BIPOLAR (ELECTRODE) ×2 IMPLANT
DRAPE C-ARM 42X72 X-RAY (DRAPES) ×2 IMPLANT
DRAPE POUCH INSTRU U-SHP 10X18 (DRAPES) ×2 IMPLANT
DRAPE SURG 17X11 SM STRL (DRAPES) ×2 IMPLANT
DRAPE U-SHAPE 47X51 STRL (DRAPES) ×2 IMPLANT
DRSG MEPILEX BORDER 4X4 (GAUZE/BANDAGES/DRESSINGS) ×2 IMPLANT
DRSG MEPILEX BORDER 4X8 (GAUZE/BANDAGES/DRESSINGS) ×1 IMPLANT
DURAPREP 26ML APPLICATOR (WOUND CARE) ×2 IMPLANT
ELECT BLADE 4.0 EZ CLEAN MEGAD (MISCELLANEOUS) ×2
ELECT CAUTERY BLADE 6.4 (BLADE) ×2 IMPLANT
ELECT PENCIL ROCKER SW 15FT (MISCELLANEOUS) ×2 IMPLANT
ELECT REM PT RETURN 9FT ADLT (ELECTROSURGICAL) ×2
ELECTRODE BLDE 4.0 EZ CLN MEGD (MISCELLANEOUS) ×1 IMPLANT
ELECTRODE REM PT RTRN 9FT ADLT (ELECTROSURGICAL) ×1 IMPLANT
FLOSEAL (HEMOSTASIS) IMPLANT
GLOVE BIOGEL PI IND STRL 8 (GLOVE) ×1 IMPLANT
GLOVE BIOGEL PI IND STRL 8.5 (GLOVE) ×1 IMPLANT
GLOVE BIOGEL PI INDICATOR 8 (GLOVE) ×1
GLOVE BIOGEL PI INDICATOR 8.5 (GLOVE) ×1
GLOVE ECLIPSE 8.5 STRL (GLOVE) ×2 IMPLANT
GLOVE ORTHO TXT STRL SZ7.5 (GLOVE) ×2 IMPLANT
GOWN STRL REUS W/ TWL LRG LVL3 (GOWN DISPOSABLE) ×1 IMPLANT
GOWN STRL REUS W/TWL 2XL LVL3 (GOWN DISPOSABLE) ×4 IMPLANT
GOWN STRL REUS W/TWL LRG LVL3 (GOWN DISPOSABLE) ×2
KIT BASIN OR (CUSTOM PROCEDURE TRAY) ×2 IMPLANT
NDL SPNL 18GX3.5 QUINCKE PK (NEEDLE) ×2 IMPLANT
NEEDLE 22X1 1/2 (OR ONLY) (NEEDLE) ×2 IMPLANT
NEEDLE SPNL 18GX3.5 QUINCKE PK (NEEDLE) ×4 IMPLANT
NS IRRIG 1000ML POUR BTL (IV SOLUTION) ×2 IMPLANT
PACK LAMINECTOMY ORTHO (CUSTOM PROCEDURE TRAY) ×2 IMPLANT
PACK UNIVERSAL I (CUSTOM PROCEDURE TRAY) ×2 IMPLANT
PATTIES SURGICAL .5 X.5 (GAUZE/BANDAGES/DRESSINGS) ×1 IMPLANT
PATTIES SURGICAL .5 X1 (DISPOSABLE) ×2 IMPLANT
SPONGE LAP 4X18 X RAY DECT (DISPOSABLE) IMPLANT
SPONGE SURGIFOAM ABS GEL 100 (HEMOSTASIS) ×2 IMPLANT
STAPLER VISISTAT 35W (STAPLE) IMPLANT
STRIP CLOSURE SKIN 1/2X4 (GAUZE/BANDAGES/DRESSINGS) IMPLANT
SURGIFLO TRUKIT (HEMOSTASIS) ×1 IMPLANT
SUT BONE WAX W31G (SUTURE) ×2 IMPLANT
SUT MON AB 3-0 SH 27 (SUTURE) ×2
SUT MON AB 3-0 SH27 (SUTURE) ×1 IMPLANT
SUT VIC AB 1 CT1 27 (SUTURE) ×4
SUT VIC AB 1 CT1 27XBRD ANTBC (SUTURE) ×2 IMPLANT
SUT VIC AB 2-0 CT1 18 (SUTURE) ×4 IMPLANT
SUT VICRYL 0 UR6 27IN ABS (SUTURE) ×2 IMPLANT
SYR BULB IRRIGATION 50ML (SYRINGE) ×2 IMPLANT
SYR CONTROL 10ML LL (SYRINGE) ×2 IMPLANT
TOWEL OR 17X26 10 PK STRL BLUE (TOWEL DISPOSABLE) ×4 IMPLANT
TRAY FOLEY CATH 14FR (SET/KITS/TRAYS/PACK) ×1 IMPLANT
WATER STERILE IRR 1000ML POUR (IV SOLUTION) ×1 IMPLANT
YANKAUER SUCT BULB TIP NO VENT (SUCTIONS) ×2 IMPLANT

## 2013-10-28 NOTE — Anesthesia Procedure Notes (Signed)
Procedure Name: Intubation Date/Time: 10/28/2013 1:32 PM Performed by: Romie MinusOCK, Jaysun Wessels K Pre-anesthesia Checklist: Patient identified, Emergency Drugs available, Suction available, Patient being monitored and Timeout performed Patient Re-evaluated:Patient Re-evaluated prior to inductionOxygen Delivery Method: Circle system utilized Preoxygenation: Pre-oxygenation with 100% oxygen Intubation Type: IV induction Ventilation: Mask ventilation without difficulty and Oral airway inserted - appropriate to patient size Laryngoscope Size: Miller and 2 Grade View: Grade I Tube type: Oral Tube size: 7.0 mm Number of attempts: 1 Airway Equipment and Method: Stylet Placement Confirmation: ETT inserted through vocal cords under direct vision,  positive ETCO2,  CO2 detector and breath sounds checked- equal and bilateral Secured at: 21 cm Tube secured with: Tape Dental Injury: Teeth and Oropharynx as per pre-operative assessment

## 2013-10-28 NOTE — Op Note (Signed)
NAMJola [name]:  [name], [name]                ACCOUNT NO.:  [id number]  MEDICAL RECORD NO.:  [id number]  LOCATION:  [id number]                        FACILITY:  [facility]  PHYSICIAN:  Alvy Bealahari D Rosalene Wardrop, MD    DATE OF BIRTH:  01-03-1961  DATE OF PROCEDURE:  10/28/2013 DATE OF DISCHARGE:                              OPERATIVE REPORT   PREOPERATIVE DIAGNOSIS:  Lumbar spinal stenosis, L4-5.  POSTOPERATIVE DIAGNOSIS:  Lumbar spinal stenosis, L4-5.  OPERATIVE PROCEDURE:  L4-5 lumbar decompression associated with problems of morbid obesity, which increased the complexity of the case.  FIRST ASSISTANT:  Genene ChurnJames M. Denton Meekwens, P.A.  HISTORY:  This is a very pleasant woman who has been complaining of severe back, buttock, and bilateral leg pain, right side worse than the left consistent with neurogenic claudication.  Radiographs confirmed the diagnosis of spinal stenosis.  After attempts at conservative management that have failed to alleviate her symptoms, we elected to proceed with surgery.  All appropriate risks, benefits, and alternatives were discussed with the patient and consent was obtained.  OPERATIVE NOTE:  The patient was brought to the operating theater, placed supine on the operating table.  After successful induction of general anesthesia and endotracheal intubation, TEDs, SCDs, and a Foley were placed.  She was turned prone onto the [place] frame.  All bony prominences were well padded and the back was prepped and draped in a standard fashion.  Time-out was taken confirming the patient, procedure, and all other pertinent important data.  Once this was completed, a midline incision was made centered over the L4-5 disk space.  Sharp dissection was carried out down to the deep fascia.  Deep fascia was sharply incised and I stripped the paraspinal muscles to expose the L4 and L5 spinous processes.  Once this was done, a self-retaining retracting system was placed and the wound was secured.  Kochers  were placed at the inferior and superior levels of the spinous process, and I confirmed the L4 spinous process.  Once I did this, I then removed the bulk of the L4 and L5 spinous process.  I then used a 304 angled Karlin curette to develop a plane underneath the ligamentum flavum at L4 and used a 2 and 3 mm Kerrison to perform a central decompression.  I performed a generous laminotomy of L4.  Once I was at the insertion site of the ligamentum flavum, I released it from the undersurface of L4 and then resected and noted it was quite thickened and somewhat calcified. It was causing significant compression to the thecal sac.  Using a Penfield 4, I exploited a plane between the thecal sac and the ligamentum flavum and used a 2 and 3 mm Kerrison to resect it.  Once I had a wide central decompression complete, I then carried my decompression into the lateral recess.  There was significant bone spur formation from the facet, which was causing lateral recess stenosis. This was removed.  I identified the L5 nerve root and traced it into the L5 pedicle and into the L5 foramen.  I then went superiorly so I was at the level of the L4 pedicle.  I could now palpate out the L4 foramen  easily with a [facility] elevator and along the L5 nerve root in the lateral recess and out the L5 foramen.  I also identified the disk space and palpated it, and there was no evidence of any significant disk herniation.  With the right side completely decompressed, I went to the contralateral side and did a similar decompression on this side.  Once I completed, again I could palpate in the lateral recess to the L4 pedicle out the L4 foramen in the lateral recess and out the L5 foramen.  With the decompression complete, I irrigated the wound copiously with normal saline and made sure I had hemostasis using bipolar electrocautery. Once this was completed, I then closed the deep fascia with interrupted #1 Vicryl sutures, a  layer of interrupted #1 Vicryl sutures, and then 2- 0 Vicryl sutures, and then a 3-0 Monocryl for the skin.  Steri-Strips and a dry dressing were applied.  The patient was ultimately extubated, transferred to the PACU without incident.  At the end of the case, all needle and sponge counts were correct.  My first assistant was Zonia KiefJames Owens, [place].  He was instrumental in assisting with retraction, visualization, wound closure.     Alvy Bealahari D Rollen Selders, MD     DDB/MEDQ  D:  10/28/2013  T:  10/28/2013  Job:  [id number]

## 2013-10-28 NOTE — Progress Notes (Signed)
Report given to Reyhan Moronta RN

## 2013-10-28 NOTE — Anesthesia Preprocedure Evaluation (Addendum)
Anesthesia Evaluation  Patient identified by MRN, date of birth, ID band Patient awake    Reviewed: Allergy & Precautions, H&P , NPO status , Patient's Chart, lab work & pertinent test results  History of Anesthesia Complications Negative for: history of anesthetic complications  Airway Mallampati: II TM Distance: >3 FB Neck ROM: Full    Dental no notable dental hx. (+) Teeth Intact, Dental Advisory Given   Pulmonary neg pulmonary ROS,  breath sounds clear to auscultation  Pulmonary exam normal       Cardiovascular hypertension, On Medications Rhythm:Regular Rate:Normal     Neuro/Psych Anxiety  Neuromuscular disease negative neurological ROS  negative psych ROS   GI/Hepatic negative GI ROS, Neg liver ROS,   Endo/Other  Morbid obesity  Renal/GU negative Renal ROS  negative genitourinary   Musculoskeletal   Abdominal   Peds  Hematology negative hematology ROS (+)   Anesthesia Other Findings   Reproductive/Obstetrics negative OB ROS                         Anesthesia Physical Anesthesia Plan  ASA: III  Anesthesia Plan: General   Post-op Pain Management:    Induction: Intravenous  Airway Management Planned: Oral ETT  Additional Equipment:   Intra-op Plan:   Post-operative Plan: Extubation in OR  Informed Consent: I have reviewed the patients History and Physical, chart, labs and discussed the procedure including the risks, benefits and alternatives for the proposed anesthesia with the patient or authorized representative who has indicated his/her understanding and acceptance.   Dental advisory given  Plan Discussed with: CRNA  Anesthesia Plan Comments:         Anesthesia Quick Evaluation

## 2013-10-28 NOTE — OR Nursing (Signed)
[name] [name] used on a trial basis. No charge to patient.  Expiration date 04/2016.  Lot # [id number]. Per request of Dr. Shon BatonBrooks.

## 2013-10-28 NOTE — H&P (Addendum)
Subjective Transcription  The patient is a 53 year old female who presents today for follow up of their back. The patient is being followed for their back pain. Symptoms reported today include: pain, weakness and numbness (right thigh and left foot which caused her to fall 09/12/13 at home ). The patient states that they are doing poorly. Current treatment includes: activity modification, NSAIDs, pain medications and muscle relaxer. The following medication has been used for pain control: antiinflammatory medication (Aleve). The patient reports their current pain level to be 10 / 10. The patient indicates that they have questions or concerns today regarding work duties (clarify work status). This visit is being required by her disability insurance.  There has been some issue concerning her disability so she returned to see me today because she has concerns. At this point in time I have gone over her imaging studies and my notes. As is appropriate medical care we have tried to treat her spinal stenosis and her radicular nerve leg pain conservatively. This is included injection therapy, physiotherapy, activity modification, and medication. The patient has failed to improve with these modalities. Furthermore she has a structural abnormality seen both on imaging studies as well as her MRI on plain x-rays as well as her MRI. She has severe spinal stenosis with a low grade spondylolisthesis which I do believe is the source of her pain.    Objective Transcription Allergies(Robin C Young; 10/01/2013 4:16 PM) Penicillin G Sodium *PENICILLINS*  Social History Tobacco use. Never smoker.  Medication History  Aspirin (81MG  Tablet, 1 (one) Oral) Active. Diltiazem HCl ER Coated Beads (180MG  Capsule ER 24HR, Oral) Active. ZyrTEC Allergy (10MG  Tablet, Oral) Active. Aleve (220MG  Tablet, Oral) Active. Medications Reconciled.  Past Surgical History  Hysterectomy. partial (non-cancerous) Arthroscopy of  Knee. left  Other Problems  High blood pressure Kidney Stone Allergic Urticaria Depression  The patient is alert. She is oriented times three. She has ongoing significant back pain, worse with extension of the spine and relief with forward flexion. She is grossly neurologically intact, but with no focal motor deficits. She does have numbness and dysesthesias in both lower extremities. She has no hip, knee, or ankle pain with joint range of motion. No loss of bowel or bladder control. She remains alert and oriented times three.   Assessments Transcription  Assessment & Plan(Robin C Young; 10/01/2013 4:20 PM) Acquired spondylolisthesis (738.4  M43.19) Lumbar spinal stenosis (724.02  M48.06)  MRI  Her MRI from 09/09/2013 shows advanced left and moderate right facet arthrosis with joint effusions, buckling of the ligamentum flavum, slight anterolisthesis producing moderate to significant central canal stenosis and especially severe left lateral recess stenosis.  Plans Transcription   Having failed conservative treatment in an attempt to alleviate her symptoms she now meets the indications for surgical procedure. The procedure of choice would be a lumbar decompression. The goal of surgery is to reduce her pain, improve her quality of life and ultimately allow her to return to gainful full time employment. While this is not an emergency surgery the patient meets the appropriate medical criteria for a lumbar decompression. At this point, she is asking whether or not she is disabled and I told her I would consider her temporarily totally disabled. I stressed temporary because the goal of the surgical procedure is to eliminate the structural abnormality, decompress the nerve root and allow her to return to work and improve her overall quality of life.  In addition to discussing the surgical procedure, I have  also gone over the risks which include infection, bleeding, nerve damage, death, stroke,  paralysis, failure to heal, need for further surgery, ongoing or worse pain, loss of bowel and bladder control, and CSF leak. At this point, since I think the major cause of her problem is stenosis and not the slip, I do not think fusion surgery is going to be warranted. All of her questions were addressed

## 2013-10-28 NOTE — Brief Op Note (Signed)
10/28/2013  4:20 PM  PATIENT:  Girtha Hakeonnie S Blankenbaker  53 y.o. female  PRE-OPERATIVE DIAGNOSIS:  SPINAL STENOSIS  POST-OPERATIVE DIAGNOSIS:  SPINAL STENOSIS  PROCEDURE:  Procedure(s): DECOMPRESSION L4-5 (N/A)  SURGEON:  Surgeon(s) and Role:    * Venita Lickahari Jden Want, MD - Primary  PHYSICIAN ASSISTANT:   ASSISTANTS: Zonia Kiefjames owens   ANESTHESIA:   spinal  EBL:  Total I/O In: 2000 [I.V.:2000] Out: 280 [Urine:250; Blood:30]  BLOOD ADMINISTERED:none  DRAINS: none   LOCAL MEDICATIONS USED:  MARCAINE     SPECIMEN:  No Specimen  DISPOSITION OF SPECIMEN:  N/A  COUNTS:  YES  TOURNIQUET:  * No tourniquets in log *  DICTATION: .Other Dictation: Dictation Number [id number]  PLAN OF CARE: Admit to inpatient   PATIENT DISPOSITION:  PACU - hemodynamically stable.

## 2013-10-28 NOTE — Anesthesia Postprocedure Evaluation (Signed)
Anesthesia Post Note  Patient: [name] [name]  Procedure(s) Performed: Procedure(s) (LRB): DECOMPRESSION L4-5 (N/A)  Anesthesia type: General  Patient location: PACU  Post pain: Pain level controlled  Post assessment: Patient's Cardiovascular Status Stable  Last Vitals:  Filed Vitals:   10/28/13 1732  BP:   Pulse:   Temp:   Resp: 12    Post vital signs: Reviewed and stable  Level of consciousness: alert  Complications: No apparent anesthesia complications

## 2013-10-28 NOTE — Transfer of Care (Signed)
Immediate Anesthesia Transfer of Care Note  Patient: [name] [name]  Procedure(s) Performed: Procedure(s): DECOMPRESSION L4-5 (N/A)  Patient Location: PACU  Anesthesia Type:General  Level of Consciousness: awake, oriented and patient cooperative  Airway & Oxygen Therapy: Patient Spontanous Breathing and Patient connected to face mask oxygen  Post-op Assessment: Report given to PACU RN and Post -op Vital signs reviewed and stable  Post vital signs: Reviewed  Complications: No apparent anesthesia complications

## 2013-10-28 NOTE — Plan of Care (Signed)
Problem: Consults Goal: Diagnosis - Spinal Surgery Thoraco/Lumbar Spine Fusion Decompression L4-L5

## 2013-10-29 MED ORDER — ONDANSETRON 4 MG PO TBDP: 4.0000 mg | ORAL_TABLET | Freq: Three times a day (TID) | ORAL | Status: DC | PRN

## 2013-10-29 MED ORDER — DOCUSATE SODIUM 100 MG PO CAPS: 100.0000 mg | ORAL_CAPSULE | Freq: Two times a day (BID) | ORAL | Status: DC

## 2013-10-29 MED ORDER — OXYCODONE-ACETAMINOPHEN 10-325 MG PO TABS: 1.0000 | ORAL_TABLET | Freq: Four times a day (QID) | ORAL | Status: DC | PRN

## 2013-10-29 MED ORDER — METHOCARBAMOL 500 MG PO TABS: 500.0000 mg | ORAL_TABLET | Freq: Four times a day (QID) | ORAL | Status: DC | PRN

## 2013-10-29 MED ORDER — POLYETHYLENE GLYCOL 3350 17 G PO PACK: 17.0000 g | PACK | Freq: Every day | ORAL | Status: DC

## 2013-10-29 NOTE — Progress Notes (Signed)
    Subjective: Procedure(s) (LRB): DECOMPRESSION L4-5 (N/A) 1 Day Post-Op  Patient reports pain as 2 on 0-10 scale.  Reports decreased leg pain reports incisional back pain   Positive void Negative bowel movement Positive flatus Negative chest pain or shortness of breath  Objective: Vital signs in last 24 hours: Temp:  [97.3 F (36.3 C)-98.1 F (36.7 C)] 97.4 F (36.3 C) (07/02 0508) Pulse Rate:  [62-86] 86 (07/02 0508) Resp:  [11-21] 20 (07/02 0529) BP: (120-165)/(58-71) 138/58 mmHg (07/02 0508) SpO2:  [93 %-98 %] 97 % (07/02 0529) FiO2 (%):  [94 %-97 %] 97 % (07/02 0529) Weight:  [257 lb 6 oz (116.745 kg)] 257 lb 6 oz (116.745 kg) (07/01 1114)  Intake/Output from previous day: 07/01 0701 - 07/02 0700 In: 3165 [I.V.:3165] Out: 2080 [Urine:2050; Blood:30]  Labs: No results found for this basename: WBC, RBC, HCT, PLT,  in the last 72 hours No results found for this basename: NA, K, CL, CO2, BUN, CREATININE, GLUCOSE, CALCIUM,  in the last 72 hours No results found for this basename: LABPT, INR,  in the last 72 hours  Physical Exam: Neurologically intact ABD soft Sensation intact distally Intact pulses distally Incision: dressing C/D/I Compartment soft  Assessment/Plan: Patient stable  xrays n/a Continue mobilization with physical therapy Continue care  Advance diet Up with therapy D/C IV fluids Possible d/c Friday  Venita Lickahari Cass Vandermeulen, MD [facility] [phone]

## 2013-10-29 NOTE — Evaluation (Signed)
Physical Therapy Evaluation and Discharge Patient Details Name: [name] [name] MRN: [id number] DOB: 08/28/1960 Today'[name] Date: 10/29/2013   History of Present Illness  53 y.o. female [name]/p L4-5 decompression.  Clinical Impression  Patient evaluated by Physical Therapy with no further acute PT needs identified. All education has been completed and the patient has no further questions. Able to correctly recall 3/3 back precautions and is mobilizing at a supervision level. Pt has strong family/friend support at home. See below for any follow-up Physial Therapy or equipment needs. PT is signing off. Thank you for this referral.     Follow Up Recommendations No PT follow up    Equipment Recommendations  None recommended by PT    Recommendations for Other Services OT consult     Precautions / Restrictions Precautions Precautions: Back Precaution Booklet Issued: Yes (comment) Precaution Comments: Reviewed back precautions Restrictions Weight Bearing Restrictions: No      Mobility  Bed Mobility Overal bed mobility: Needs Assistance Bed Mobility: Rolling;Sidelying to Sit;Sit to Sidelying Rolling: Supervision Sidelying to sit: Supervision     Sit to sidelying: Supervision General bed mobility comments: Educated on log roll technique. Pt able to perform this task safely with VCs for technique. No physical assist needed.  Transfers Overall transfer level: Needs assistance Equipment used: Rolling walker (2 wheeled) Transfers: Sit to/from Stand Sit to Stand: Supervision         General transfer comment: Supervision for safety. VCs for hand placement with sit<>stand. Maintains back precautions safely.  Ambulation/Gait Ambulation/Gait assistance: Supervision Ambulation Distance (Feet): 125 Feet Assistive device: Rolling walker (2 wheeled) Gait Pattern/deviations: Step-to pattern;Step-through pattern;Decreased stride length   Gait velocity interpretation: Below normal speed for  age/gender General Gait Details: Educated on safe DME use with rolling walker. Pt initially step to gait pattern but quickly progressed to step-through sequencing with VCs. No instances of buckling or loss of balance. Safely handles rolling walker.  Stairs Stairs: Yes Stairs assistance: Supervision Stair Management: No rails;Forwards;Backwards;With walker Number of Stairs: 1 (x2) General stair comments: Educated on step training similar to home environment. step forward up/down from single step. VCs for sequencing and technique. Practiced stepping backwards for step-stool into bed. Pt reports she feels confident with this task and has no further questions.  Wheelchair Mobility    Modified Rankin (Stroke Patients Only)       Balance Overall balance assessment: Needs assistance Sitting-balance support: No upper extremity supported;Feet supported Sitting balance-Leahy Scale: Good     Standing balance support: No upper extremity supported Standing balance-Leahy Scale: Fair                               Pertinent Vitals/Pain 4/10 pain Patient repositioned in chair for comfort.     Home Living Family/patient expects to be discharged to:: Private residence Living Arrangements: Other (Comment) (partner and 2 other friends) Available Help at Discharge: Friend([name]);Available 24 hours/day Type of Home: House Home Access: Stairs to enter Entrance Stairs-Rails: Left Entrance Stairs-Number of Steps: 1 Home Layout: One level Home Equipment: Walker - 2 wheels;Cane - single point;Bedside commode;Shower seat      Prior Function Level of Independence: Needs assistance   Gait / Transfers Assistance Needed: used can for ampulation  ADL'[name] / Homemaking Assistance Needed: needed assistance to put socks and shoes on        Hand Dominance   Dominant Hand: Right    Extremity/Trunk Assessment   Upper Extremity Assessment:  Defer to OT evaluation           Lower  Extremity Assessment: Overall WFL for tasks assessed         Communication   Communication: No difficulties  Cognition Arousal/Alertness: Awake/alert Behavior During Therapy: WFL for tasks assessed/performed Overall Cognitive Status: Within Functional Limits for tasks assessed                      General Comments      Exercises General Exercises - Lower Extremity Ankle Circles/Pumps: AROM;Both;10 reps;Seated Long Arc Quad: AROM;Both;10 reps;Seated      Assessment/Plan    PT Assessment Patent does not need any further PT services  PT Diagnosis     PT Problem List    PT Treatment Interventions     PT Goals (Current goals can be found in the Care Plan section) Acute Rehab PT Goals Patient Stated Goal: to go home tomorrow PT Goal Formulation: No goals set, d/c therapy    Frequency     Barriers to discharge        Co-evaluation               End of Session   Activity Tolerance: Patient tolerated treatment well Patient left: in chair;with call bell/phone within reach;with family/visitor present Nurse Communication: Mobility status         Time: [id number] PT Time Calculation (min): 34 min   Charges:   PT Evaluation $Initial PT Evaluation Tier I: 1 Procedure PT Treatments $Gait Training: 8-22 mins $Therapeutic Activity: 8-22 mins   PT G Codes:         [name] [name], [place]PT [phone]  [name] [name], [name] [name] 10/29/2013, 2:49 PM

## 2013-10-29 NOTE — Evaluation (Signed)
Occupational Therapy Evaluation Patient Details Name: [name] [name] MRN: [id number] DOB: 04/27/1961 Today's Date: 10/29/2013    History of Present Illness L4-5 lumbar decompression   Clinical Impression   This 53 yo female presents to acute OT with increased pain, decreased mobility, and back precautions thus affecting pt's ability to take care of herself. Pt will benefit from one more session of OT for AE education and shower stall transfer to seat.    Follow Up Recommendations  No OT follow up    Equipment Recommendations  None recommended by OT       Precautions / Restrictions Precautions Precautions: Back Precaution Booklet Issued: Yes (comment) Restrictions Weight Bearing Restrictions: No      Mobility Bed Mobility Overal bed mobility: Needs Assistance Bed Mobility: Rolling;Sidelying to Sit Rolling: Min guard Sidelying to sit: Min guard       General bed mobility comments: VCs for sequence and hand placement  Transfers Overall transfer level: Needs assistance Equipment used: Rolling walker (2 wheeled) Transfers: Sit to/from Stand Sit to Stand: Min guard         General transfer comment: VCs for safe hand placement         ADL Overall ADL's : Needs assistance/impaired Eating/Feeding: Independent;Sitting   Grooming: Set up;Sitting   Upper Body Bathing: Set up;Sitting   Lower Body Bathing: Maximal assistance;Sit to/from stand;Cueing for back precautions   Upper Body Dressing : Set up;Sitting   Lower Body Dressing: Total assistance;With adaptive equipment;Cueing for back precautions;Sit to/from stand   Toilet Transfer: Min guard;Comfort height toilet;Grab bars   Toileting- ArchitectClothing Manipulation and Hygiene: Moderate assistance;Cueing for back precautions;Sit to/from stand       Functional mobility during ADLs: Min guard                 Pertinent Vitals/Pain 4/10 back; repositioned     Hand Dominance Right   Extremity/Trunk  Assessment Upper Extremity Assessment Upper Extremity Assessment: Overall WFL for tasks assessed   Lower Extremity Assessment Lower Extremity Assessment: Defer to PT evaluation       Communication Communication Communication: No difficulties   Cognition Arousal/Alertness: Awake/alert Behavior During Therapy: WFL for tasks assessed/performed Overall Cognitive Status: Within Functional Limits for tasks assessed                                Home Living Family/patient expects to be discharged to:: Private residence Living Arrangements: Other (Comment) (partner and 2 other friends) Available Help at Discharge: Family Type of Home: House             Bathroom Shower/Tub: Walk-in shower;Curtain   Bathroom Toilet: Standard     Home Equipment: Shower seat          Prior Functioning/Environment Level of Independence: Independent             OT Diagnosis: Generalized weakness;Acute pain   OT Problem List: Decreased range of motion;Pain;Impaired balance (sitting and/or standing);Decreased knowledge of use of DME or AE;Decreased knowledge of precautions;Obesity   OT Treatment/Interventions: Self-care/ADL training;Patient/family education;Balance training;DME and/or AE instruction    OT Goals(Current goals can be found in the care plan section) Acute Rehab OT Goals Patient Stated Goal: to go home tomorrow OT Goal Formulation: With patient Time For Goal Achievement: 11/05/13 Potential to Achieve Goals: Good  OT Frequency: Min 2X/week              End of Session Equipment Utilized  During Treatment: Rolling walker  Activity Tolerance: Patient tolerated treatment well Patient left: in chair;with call bell/phone within reach   Time: 1191-47820946-1017 OT Time Calculation (min): 31 min Charges:  OT General Charges $OT Visit: 1 Procedure OT Evaluation $Initial OT Evaluation Tier I: 1 Procedure OT Treatments $Self Care/Home Management : 8-22 mins  [name] [name],  [name] [name] Eva [phone] 10/29/2013, 11:43 AM

## 2013-10-29 NOTE — Progress Notes (Signed)
Utilization review completed. Ryver Zadrozny, RN, BSN. 

## 2013-10-29 NOTE — Clinical Social Work Note (Signed)
Referred to CSW today for ?SNF. Chart reviewed and have spoken with RNCM and Care Coordinator who indicate patient plans to d/c home with HH and DME. CSW to sign off- please contact us if SW needs arise. Kealani Leckey, MSW, LCSWA [phone]   

## 2013-10-30 NOTE — Progress Notes (Signed)
Patient ID: [name] [name], female   DOB: 02/04/1961, 53 y.o.   MRN: [id number] Subjective: 2 Days Post-Op Procedure(s) (LRB): DECOMPRESSION L4-5 (N/A)    Patient reports pain as mild.  Doing well.  Ready to go home.  Positive flatus no BM yet  Objective:   VITALS:   Filed Vitals:   10/30/13 0548  BP: 106/50  Pulse: 62  Temp: 97.8 F (36.6 C)  Resp: 18    Neurologically intact  LABS No results found for this basename: HGB, HCT, WBC, PLT,  in the last 72 hours  No results found for this basename: NA, K, BUN, CREATININE, GLUCOSE,  in the last 72 hours  No results found for this basename: LABPT, INR,  in the last 72 hours   Assessment/Plan: 2 Days Post-Op Procedure(s) (LRB): DECOMPRESSION L4-5 (N/A)   Discharge home today Dry dressing and supplies for home  RTC with Shon BatonBrooks in 2 weeks

## 2013-10-30 NOTE — Care Management Note (Signed)
CARE MANAGEMENT NOTE 10/30/2013  Patient:  [name] [name],[name] [name]   Account Number:  [id number]  Date Initiated:  10/30/2013  Documentation initiated by:  Vance PeperBRADY,Findlay Dagher  Subjective/Objective Assessment:   53 yr old female [name]/p L4-5 decompression.     Action/Plan:   No home health needs identified by case manager and physical therapist.   Anticipated DC Date:  10/30/2013   Anticipated DC Plan:  HOME/SELF CARE      DC Planning Services  CM consult      PAC Choice  NA   Choice offered to / List presented to:     DME arranged  NA        HH arranged  NA      Status of service:  Completed, signed off Medicare Important Message given?   (If response is "NO", the following Medicare IM given date fields will be blank) Date Medicare IM given:   Medicare IM given by:   Date Additional Medicare IM given:   Additional Medicare IM given by:    Discharge Disposition:  HOME W HOME HEALTH SERVICES  Per UR Regulation:  Reviewed for med. necessity/level of care/duration of stay

## 2013-10-30 NOTE — Progress Notes (Signed)
Occupational Therapy Treatment and Discharge Patient Details Name: ISSABELA LESKO MRN: [id number] DOB: 14-Jul-1960 Today's Date: 10/30/2013    History of present illness 53 y.o. female s/p L4-5 decompression.   OT comments  Focus of today's session was on practicing a shower transfer to a shower seat and educating pt on AE to assist with LB bathing/dress while adhering to back precautions. Pt demonstrated understanding of proper shower transfer technique and was excited about the AE, stating that she would have her friend purchase the AE kit from the gift shop. Pt will have supervision at home and feels confident in ADLs, therefore no further OT is needed. We will sign off.  Follow Up Recommendations  No OT follow up    Equipment Recommendations  None recommended by OT       Precautions / Restrictions Precautions Precautions: Back Precaution Comments: Reviewed back precautions       Mobility Bed Mobility Overal bed mobility: Needs Assistance Bed Mobility: Rolling;Sidelying to Sit Rolling: Supervision Sidelying to sit: Supervision       General bed mobility comments: for safety  Transfers Overall transfer level: Needs assistance Equipment used: Rolling walker (2 wheeled) Transfers: Sit to/from Stand Sit to Stand: Supervision              Balance Overall balance assessment: Needs assistance Sitting-balance support: No upper extremity supported;Feet supported Sitting balance-Leahy Scale: Good     Standing balance support: No upper extremity supported Standing balance-Leahy Scale: Fair                     ADL Overall ADL's : Needs assistance/impaired             Lower Body Bathing: Sit to/from stand;With adaptive equipment;Adhering to back precautions;Supervison/ safety       Lower Body Dressing: With adaptive equipment;Sit to/from stand;Adhering to back precautions;Supervision/safety   Toilet Transfer: Comfort height toilet;Grab  bars;Supervision/safety;RW;Ambulation   Toileting- Clothing Manipulation and Hygiene: Sit to/from stand;Adhering to back precautions;Moderate assistance   Tub/ Shower Transfer: Gaffer;Ambulation;Shower seat;Supervision/safety;Adhering to back precautions;Rolling walker   Functional mobility during ADLs: Supervision/safety;Rolling walker General ADL Comments: Pt ambulated to the gym and practiced a shower transfer to the shower seat with no physical A needed. Educated pt and had her demonstrate understanding of use of AE for LB bathing/dressing and the toilet aide. Pt seemed excited about the AE and stated she would have her friends get it from the gift shop.          Perception     Praxis      Cognition   Behavior During Therapy: WFL for tasks assessed/performed Overall Cognitive Status: Within Functional Limits for tasks assessed                                    Pertinent Vitals/ Pain       No c/o pain during tx.         Frequency Min 2X/week     Progress Toward Goals  OT Goals(current goals can now be found in the care plan section)  Progress towards OT goals: Goals met/education completed, patient discharged from OT  Acute Rehab OT Goals Patient Stated Goal: to go home OT Goal Formulation: With patient Time For Goal Achievement: 11/05/13 Potential to Achieve Goals: Good  Plan Discharge plan remains appropriate       End of Session Equipment Utilized During Treatment: Rolling  walker;Gait belt   Activity Tolerance Patient tolerated treatment well   Patient Left in chair;with call bell/phone within reach;with family/visitor present           Time:  -     Charges:    Lyda Perone 10/30/2013, 11:29 AM

## 2013-11-02 ENCOUNTER — Encounter (HOSPITAL_COMMUNITY): Payer: Self-pay | Admitting: Orthopedic Surgery

## 2013-11-05 NOTE — Progress Notes (Signed)
I have read and agree with this note.   Time in/out: [id number] Total time:32 minutes (2SC)  Ignacia Palmaathy Tyrone Pautsch, OTR/L [phone]

## 2013-11-16 NOTE — Discharge Summary (Signed)
Patient ID: [name] [name] MRN: [id number] DOB/AGE: 54/06/1960 54 y.o.  Admit date: 10/28/2013 Discharge date: 11/16/2013  Admission Diagnoses:  Active Problems:   Back pain   Discharge Diagnoses:  Active Problems:   Back pain  status post Procedure(s): DECOMPRESSION L4-5  Past Medical History  Diagnosis Date  . Arthritis   . Hypertension   . Neuromuscular disorder     bacl pain  . Anxiety 2008    seen in ED /w chest pain but told that she was having anxiety  . Sleep difficulties     has had a sleep study - t old that she didn't meet criteria for machine   . Chronic kidney disease 1999    passed kidney stones spontaneously  . Hives of unknown origin     told by PCP Terri Piedra( [place]) to use Zrytec BID to control hives   . History of stress test 2010    done due to chest pain /w Dr. Jacinto HalimGanji. Pt. told that it was wnl.    Surgeries: Procedure(s): DECOMPRESSION L4-5 on 10/28/2013   Consultants:    Discharged Condition: Improved  Hospital Course: [name] HakeConnie S Janik is an 53 y.o. female who was admitted 10/28/2013 for operative treatment of L4-5 stenosis. Patient failed conservative treatments (please see the history and physical for the specifics) and had severe unremitting pain that affects sleep, daily activities and work/hobbies. After pre-op clearance, the patient was taken to the operating room on 10/28/2013 and underwent  Procedure(s): DECOMPRESSION L4-5.    Patient was given perioperative antibiotics:  Anti-infectives   Start     Dose/Rate Route Frequency Ordered Stop   10/27/13 1417  vancomycin (VANCOCIN) 1,500 mg in sodium chloride 0.9 % 500 mL IVPB     1,500 mg 250 mL/hr over 120 Minutes Intravenous 120 min pre-op 10/27/13 1417 10/28/13 1318       Patient was given sequential compression devices and early ambulation to prevent DVT.   Patient benefited maximally from hospital stay and there were no complications. At the time of discharge, the patient was urinating/moving  their bowels without difficulty, tolerating a regular diet, pain is controlled with oral pain medications and they have been cleared by PT/OT.   Recent vital signs: No data found.    Recent laboratory studies: No results found for this basename: WBC, HGB, HCT, PLT, NA, K, CL, CO2, BUN, CREATININE, GLUCOSE, PT, INR, CALCIUM, 2,  in the last 72 hours   Discharge Medications:     Medication List    STOP taking these medications       naproxen sodium 220 MG tablet  Commonly known as:  ANAPROX      TAKE these medications       cetirizine 10 MG tablet  Commonly known as:  ZYRTEC  Take 10 mg by mouth 2 (two) times daily.     diltiazem 180 MG 24 hr capsule  Commonly known as:  DILACOR XR  Take 180 mg by mouth daily.     docusate sodium 100 MG capsule  Commonly known as:  COLACE  Take 1 capsule (100 mg total) by mouth 2 (two) times daily.     methocarbamol 500 MG tablet  Commonly known as:  ROBAXIN  Take 1 tablet (500 mg total) by mouth every 6 (six) hours as needed for muscle spasms.     ondansetron 4 MG disintegrating tablet  Commonly known as:  ZOFRAN ODT  Take 1 tablet (4 mg total) by mouth every 8 (  eight) hours as needed for nausea.     oxyCODONE-acetaminophen 10-325 MG per tablet  Commonly known as:  PERCOCET  Take 1 tablet by mouth every 6 (six) hours as needed for pain.     polyethylene glycol packet  Commonly known as:  MIRALAX / GLYCOLAX  Take 17 g by mouth daily.        Diagnostic Studies: Dg Lumbar Spine 2-3 Views  10/28/2013   CLINICAL DATA:  Portable intraoperative film.  EXAM: LUMBAR SPINE - 2-3 VIEW  COMPARISON:  None.  FINDINGS: There are assumed to be 12 lumbar type non-rib-bearing vertebral bodies.  The image labeled #1 has the upper metallic localization device projecting over the L2-L3 facet joint with the lower localization device projecting posterior to the L3-L4 facet.  The image labeled #2 has the larger caliber trocars. The upper trocar lies  posterior to the L3-L4 facet joint while the lowermost trocar projects posterior to the L4-L5 facet joint.  IMPRESSION: Positioning of the metallic trocars on the 2 separate images is as described.   Electronically Signed   By: David  [place]   On: 10/28/2013 16:54        Discharge Instructions   Call MD / Call 911    Complete by:  As directed   If you experience chest pain or shortness of breath, CALL 911 and be transported to the hospital emergency room.  If you develope a fever above 101 F, pus (white drainage) or increased drainage or redness at the wound, or calf pain, call your surgeon's office.     Constipation Prevention    Complete by:  As directed   Drink plenty of fluids.  Prune juice may be helpful.  You may use a stool softener, such as Colace (over the counter) 100 mg twice a day.  Use MiraLax (over the counter) for constipation as needed.     Diet - low sodium heart healthy    Complete by:  As directed      Discharge instructions    Complete by:  As directed   Keep incision site dry Activity as directed per Dr. Shon Baton     Driving restrictions    Complete by:  As directed   No driving until cleared by Dr. Shon Baton     Increase activity slowly as tolerated    Complete by:  As directed              Discharge Plan:  discharge to home  Disposition:     Signed: Venita Lick D for Dr. Venita Lick [facility] [phone] 11/16/2013, 4:29 PM

## 2014-02-23 ENCOUNTER — Ambulatory Visit: Payer: 59 | Admitting: Internal Medicine

## 2014-04-30 HISTORY — PX: COLONOSCOPY: SHX174

## 2014-12-03 ENCOUNTER — Encounter (HOSPITAL_COMMUNITY): Payer: Self-pay | Admitting: Emergency Medicine

## 2014-12-03 ENCOUNTER — Emergency Department (HOSPITAL_COMMUNITY)
Admission: EM | Admit: 2014-12-03 | Discharge: 2014-12-03 | Disposition: A | Discharge status: Home / Self Care | Payer: 59 | Attending: Emergency Medicine | Admitting: Emergency Medicine

## 2014-12-03 DIAGNOSIS — N189 Chronic kidney disease, unspecified: Secondary | ICD-10-CM | POA: Diagnosis not present

## 2014-12-03 DIAGNOSIS — I129 Hypertensive chronic kidney disease with stage 1 through stage 4 chronic kidney disease, or unspecified chronic kidney disease: Secondary | ICD-10-CM | POA: Diagnosis not present

## 2014-12-03 DIAGNOSIS — Z79899 Other long term (current) drug therapy: Secondary | ICD-10-CM | POA: Insufficient documentation

## 2014-12-03 DIAGNOSIS — Z880 Allergy status to penicillin: Secondary | ICD-10-CM | POA: Diagnosis not present

## 2014-12-03 DIAGNOSIS — R55 Syncope and collapse: Secondary | ICD-10-CM

## 2014-12-03 DIAGNOSIS — F419 Anxiety disorder, unspecified: Secondary | ICD-10-CM | POA: Diagnosis not present

## 2014-12-03 DIAGNOSIS — M1990 Unspecified osteoarthritis, unspecified site: Secondary | ICD-10-CM | POA: Insufficient documentation

## 2014-12-03 LAB — URINE MICROSCOPIC-ADD ON

## 2014-12-03 LAB — CBC WITH DIFFERENTIAL/PLATELET
Basophils Absolute: 0 10*3/uL (ref 0.0–0.1)
Basophils Relative: 0 % (ref 0–1)
Eosinophils Absolute: 0.5 10*3/uL (ref 0.0–0.7)
Eosinophils Relative: 5 % (ref 0–5)
HEMATOCRIT: 44.5 % (ref 36.0–46.0)
Hemoglobin: 15 g/dL (ref 12.0–15.0)
LYMPHS ABS: 1.9 10*3/uL (ref 0.7–4.0)
Lymphocytes Relative: 19 % (ref 12–46)
MCH: 31.1 pg (ref 26.0–34.0)
MCHC: 33.7 g/dL (ref 30.0–36.0)
MCV: 92.1 fL (ref 78.0–100.0)
MONOS PCT: 7 % (ref 3–12)
Monocytes Absolute: 0.7 10*3/uL (ref 0.1–1.0)
Neutro Abs: 6.8 10*3/uL (ref 1.7–7.7)
Neutrophils Relative %: 69 % (ref 43–77)
Platelets: 226 10*3/uL (ref 150–400)
RBC: 4.83 MIL/uL (ref 3.87–5.11)
RDW: 12.6 % (ref 11.5–15.5)
WBC: 9.9 10*3/uL (ref 4.0–10.5)

## 2014-12-03 LAB — RAPID URINE DRUG SCREEN, HOSP PERFORMED
AMPHETAMINES: NOT DETECTED
Barbiturates: NOT DETECTED
Benzodiazepines: POSITIVE — AB
Cocaine: NOT DETECTED
Opiates: NOT DETECTED
TETRAHYDROCANNABINOL: NOT DETECTED

## 2014-12-03 LAB — TROPONIN I

## 2014-12-03 LAB — BASIC METABOLIC PANEL
ANION GAP: 7 (ref 5–15)
BUN: 13 mg/dL (ref 6–20)
CALCIUM: 8.6 mg/dL — AB (ref 8.9–10.3)
CO2: 23 mmol/L (ref 22–32)
Chloride: 111 mmol/L (ref 101–111)
Creatinine, Ser: 0.66 mg/dL (ref 0.44–1.00)
GFR calc Af Amer: >60 mL/min (ref >60)
Glucose, Bld: 80 mg/dL (ref 65–99)
Potassium: 3.7 mmol/L (ref 3.5–5.1)
SODIUM: 141 mmol/L (ref 135–145)

## 2014-12-03 LAB — URINALYSIS, ROUTINE W REFLEX MICROSCOPIC
BILIRUBIN URINE: NEGATIVE
GLUCOSE, UA: NEGATIVE mg/dL
KETONES UR: NEGATIVE mg/dL
LEUKOCYTES UA: NEGATIVE
Nitrite: NEGATIVE
Protein, ur: NEGATIVE mg/dL
Specific Gravity, Urine: 1.01 (ref 1.005–1.030)
Urobilinogen, UA: 0.2 mg/dL (ref 0.0–1.0)
pH: 5 (ref 5.0–8.0)

## 2014-12-03 MED ORDER — SODIUM CHLORIDE 0.9 % IV BOLUS (SEPSIS)
1000.0000 mL | Freq: Once | INTRAVENOUS | Status: AC
  Administered 2014-12-03: 1000 mL via INTRAVENOUS

## 2014-12-03 NOTE — ED Notes (Signed)
Per [facility], pt was walking around [employer], felt dizzy and sat down. Pt doesn't think she lost consciousness. Per ems, pt was cool and clammy, negative orthostatics, pt was hypertensive and taking cardizem for that. EKG shows RBBB, pt unsure of hx of the same. Pt asymptomatic upon ems arrival to ED. Pt AAOX4, in NAD.

## 2014-12-03 NOTE — ED Provider Notes (Signed)
CSN: [id number]     Arrival date & time 12/03/14  1623 History   First MD Initiated Contact with Patient 12/03/14 1629     Chief Complaint  Patient presents with  . Near Syncope     (Consider location/radiation/quality/duration/timing/severity/associated sxs/prior Treatment) HPI Comments: Patient was walking around store for about 20 minutes which she began to feel lightheaded, dizzy, nauseated, cold clammy and sweaty. She endorses both room spinning and lightheadedness. She went to sit down before actually passing out. Did not lose consciousness. Denies chest pain or shortness of breath. States she felt well prior to going to the store. Eating and drinking normally. Denies any recent medication changes or new medications. Denies any heart or lung history. She has chronic back pain from previous surgery which is unchanged. She reports a previous syncopal episode in 2002 with unrevealing workup. Negative stress test in 2010. No nausea or vomiting. No chest pain, abdominal pain, shortness of breath. Back pain is unchanged from baseline. No focal weakness, numbness or tingling.  The history is provided by the patient.    Past Medical History  Diagnosis Date  . Arthritis   . Hypertension   . Neuromuscular disorder     bacl pain  . Anxiety 2008    seen in ED /w chest pain but told that she was having anxiety  . Sleep difficulties     has had a sleep study - t old that she didn't meet criteria for machine   . Chronic kidney disease 1999    passed kidney stones spontaneously  . Hives of unknown origin     told by PCP Terri Piedra) to use Zrytec BID to control hives   . History of stress test 2010    done due to chest pain /w Dr. Jacinto Halim. Pt. told that it was wnl.   Past Surgical History  Procedure Laterality Date  . Knee arthroscopy  12/2008    left knee  . Partial hysterectomy  10/1994  . Thumb surgery  03/1975  . Decompressive lumbar laminectomy level 1 N/A 10/28/2013    Procedure:  DECOMPRESSION L4-5;  Surgeon: Venita Lick, MD;  Location: [facility] OR;  Service: Orthopedics;  Laterality: N/A;   Family History  Problem Relation Age of Onset  . Stroke Father   . Diabetes Other   . Hyperlipidemia Other   . Heart disease Other   . Hypertension Other   . Cancer Other    History  Substance Use Topics  . Smoking status: Never Smoker   . Smokeless tobacco: Not on file  . Alcohol Use: No   OB History    No data available     Review of Systems  Constitutional: Positive for diaphoresis and fatigue. Negative for fever, activity change and appetite change.  HENT: Negative for congestion and rhinorrhea.   Eyes: Negative for visual disturbance.  Respiratory: Negative for cough, chest tightness and shortness of breath.   Cardiovascular: Negative for chest pain and leg swelling.  Gastrointestinal: Negative for nausea, vomiting and abdominal pain.  Genitourinary: Negative for dysuria, hematuria, vaginal bleeding and vaginal discharge.  Musculoskeletal: Negative for myalgias, back pain and arthralgias.  Skin: Negative for pallor and rash.  Neurological: Positive for dizziness, syncope, weakness and light-headedness. Negative for headaches.  A complete 10 system review of systems was obtained and all systems are negative except as noted in the HPI and PMH.      Allergies  Penicillins  Home Medications   Prior to Admission medications  Medication Sig Start Date End Date Taking? Authorizing Provider  ALPRAZolam (XANAX) 0.25 MG tablet Take 0.25 mg by mouth 2 (two) times daily.   Yes Historical Provider, MD  cetirizine (ZYRTEC) 10 MG tablet Take 10 mg by mouth 2 (two) times daily.   Yes Historical Provider, MD  diltiazem (DILACOR XR) 180 MG 24 hr capsule Take 180 mg by mouth daily.   Yes Historical Provider, MD  gabapentin (NEURONTIN) 600 MG tablet Take 900 mg by mouth 2 (two) times daily. 11/06/14  Yes Historical Provider, MD  docusate sodium (COLACE) 100 MG capsule Take 1  capsule (100 mg total) by mouth 2 (two) times daily. Patient not taking: Reported on 12/03/2014 10/29/13   Naida Sleight, PA-C  methocarbamol (ROBAXIN) 500 MG tablet Take 1 tablet (500 mg total) by mouth every 6 (six) hours as needed for muscle spasms. Patient not taking: Reported on 12/03/2014 10/29/13   Naida Sleight, PA-C  ondansetron (ZOFRAN ODT) 4 MG disintegrating tablet Take 1 tablet (4 mg total) by mouth every 8 (eight) hours as needed for nausea. Patient not taking: Reported on 12/03/2014 10/29/13   Naida Sleight, PA-C  oxyCODONE-acetaminophen (PERCOCET) 10-325 MG per tablet Take 1 tablet by mouth every 6 (six) hours as needed for pain. Patient not taking: Reported on 12/03/2014 10/29/13   Naida Sleight, PA-C  polyethylene glycol [facility] Ethelene Hal) packet Take 17 g by mouth daily. Patient not taking: Reported on 12/03/2014 10/29/13   Naida Sleight, PA-C   BP 139/66 mmHg  Pulse 69  Temp(Src) 97.6 F (36.4 C) (Oral)  Resp 15  Ht 5' (1.524 m)  Wt 250 lb (113.399 kg)  BMI 48.82 kg/m2  SpO2 99% Physical Exam  Constitutional: She is oriented to person, place, and time. She appears well-developed and well-nourished. No distress.  HENT:  Head: Normocephalic and atraumatic.  Mouth/Throat: Oropharynx is clear and moist. No oropharyngeal exudate.  Eyes: Conjunctivae and EOM are normal. Pupils are equal, round, and reactive to light.  Neck: Normal range of motion. Neck supple.  No meningismus.  Cardiovascular: Normal rate, regular rhythm, normal heart sounds and intact distal pulses.   No murmur heard. Pulmonary/Chest: Effort normal and breath sounds normal. No respiratory distress.  Abdominal: Soft. There is no tenderness. There is no rebound and no guarding.  obese  Musculoskeletal: Normal range of motion. She exhibits no edema or tenderness.  Neurological: She is alert and oriented to person, place, and time. No cranial nerve deficit. She exhibits normal muscle tone. Coordination normal.  No ataxia  on finger to nose bilaterally. No pronator drift. 5/5 strength throughout. CN 2-12 intact. Equal grip strength. Sensation intact.  Skin: Skin is warm.  Psychiatric: She has a normal mood and affect. Her behavior is normal.  Nursing note and vitals reviewed.   ED Course  Procedures (including critical care time) Labs Review Labs Reviewed  BASIC METABOLIC PANEL - Abnormal; Notable for the following:    Calcium 8.6 (*)    All other components within normal limits  URINALYSIS, ROUTINE W REFLEX MICROSCOPIC (NOT AT [facility]) - Abnormal; Notable for the following:    Hgb urine dipstick TRACE (*)    All other components within normal limits  URINE RAPID DRUG SCREEN, HOSP PERFORMED - Abnormal; Notable for the following:    Benzodiazepines POSITIVE (*)    All other components within normal limits  URINE MICROSCOPIC-ADD ON - Abnormal; Notable for the following:    Casts HYALINE CASTS (*)  All other components within normal limits  CBC WITH DIFFERENTIAL/PLATELET  TROPONIN I    Imaging Review No results found.   EKG Interpretation   Date/Time:  Friday December 03 2014 16:28:36 EDT Ventricular Rate:  72 PR Interval:  164 QRS Duration: 129 QT Interval:  405 QTC Calculation: 443 R Axis:   13 Text Interpretation:  Sinus rhythm Nonspecific intraventricular conduction  delay No significant change was found Confirmed by Manus Gunning  MD, Quinterrius Errington  [phone]) on 12/03/2014 4:31:11 PM      MDM   Final diagnoses:  Near syncope   Syncopal episode without prodrome of dizziness, lightheadedness, nausea, diaphoresis. No chest pain or shortness of breath.  Heart rate 75 with standing, 61 with lying. Patient given IV and by mouth fluids.  Labs unremarkable. EKG normal sinus rhythm. Old right bundle-branch block. No evidence of Brugada or prolonged QT.  EKG reviewed with Dr. Delton See who does not feel it represents Brugada. Suspect vasovagal syncope.  Patient asymptomatic. toleraitng PO and ambulatory.   Appears stable for followup with PCP.   Glynn Octave, MD 12/04/14 6045

## 2014-12-03 NOTE — ED Notes (Signed)
Pt ambulated well to bathroom

## 2014-12-03 NOTE — Discharge Instructions (Signed)
Near-Syncope  follow-up with a cardiologist for a stress test and echocardiogram. Keep yourself hydrated. Return to the ED if you develop new or worsening symptoms. Near-syncope (commonly known as near fainting) is sudden weakness, dizziness, or feeling like you might pass out. During an episode of near-syncope, you may also develop pale skin, have tunnel vision, or feel sick to your stomach (nauseous). Near-syncope may occur when getting up after sitting or while standing for a long time. It is caused by a sudden decrease in blood flow to the brain. This decrease can result from various causes or triggers, most of which are not serious. However, because near-syncope can sometimes be a sign of something serious, a medical evaluation is required. The specific cause is often not determined. HOME CARE INSTRUCTIONS  Monitor your condition for any changes. The following actions may help to alleviate any discomfort you are experiencing:  Have someone stay with you until you feel stable.  Lie down right away and prop your feet up if you start feeling like you might faint. Breathe deeply and steadily. Wait until all the symptoms have passed. Most of these episodes last only a few minutes. You may feel tired for several hours.   Drink enough fluids to keep your urine clear or pale yellow.   If you are taking blood pressure or heart medicine, get up slowly when seated or lying down. Take several minutes to sit and then stand. This can reduce dizziness.  Follow up with your health care provider as directed. SEEK IMMEDIATE MEDICAL CARE IF:   You have a severe headache.   You have unusual pain in the chest, abdomen, or back.   You are bleeding from the mouth or rectum, or you have black or tarry stool.   You have an irregular or very fast heartbeat.   You have repeated fainting or have seizure-like jerking during an episode.   You faint when sitting or lying down.   You have confusion.    You have difficulty walking.   You have severe weakness.   You have vision problems.  MAKE SURE YOU:   Understand these instructions.  Will watch your condition.  Will get help right away if you are not doing well or get worse. Document Released: 04/16/2005 Document Revised: 04/21/2013 Document Reviewed: 09/19/2012 [facility] Patient Information 2015 [place], [place]. This information is not intended to replace advice given to you by your health care provider. Make sure you discuss any questions you have with your health care provider.

## 2015-02-17 ENCOUNTER — Ambulatory Visit (INDEPENDENT_AMBULATORY_CARE_PROVIDER_SITE_OTHER): Payer: 59 | Admitting: Cardiology

## 2015-02-17 ENCOUNTER — Encounter: Payer: Self-pay | Admitting: Cardiology

## 2015-02-17 VITALS — BP 164/82 | HR 77 | Ht 60.0 in | Wt 255.6 lb

## 2015-02-17 DIAGNOSIS — R55 Syncope and collapse: Secondary | ICD-10-CM

## 2015-02-17 NOTE — Progress Notes (Signed)
Cardiology Office Note   Date:  02/17/2015   ID:  [name] [name], DOB 1960-11-17, MRN [id number]  PCP:  No PCP Per Patient  Cardiologist:   Cyann Venti [place], MD   Chief Complaint  Patient presents with  . New Evaluation    self referral pt c/o she can be just sitting and talking and she would break out in cold sweat, feels dizzy, and clammy  . Chest Pain    pt c/o no chest pain   . Shortness of Breath    at times when she walk about 3 quartersinto work she gets SOB but its not all the time  . Edema    no swelling in legs      History of Present Illness: [name] [name] is a 54 y.o. female who presents for evaluation of syncope. She is self referred. She is a morbidly obese WF with history of HTN. She states that 2 months ago she "passed out" while shopping at [employer]. She reports she broke out in a cold sweat, felt her pulse racing, and became very dizzy. Cannot remember if she lost consciousness but states the next thing she knew she was lying on the floor and paramedics were there. Seen in ED and evaluation there unremarkable. States she has had similar symptoms a couple of times a week since then but hasn't blacked out. She has had prior similar symptoms several times in the past including one episode while at work. Episodes date back at least to 2010. She had an Echo and stress test at that time with Dr. Jacinto Halim. She denies any SOB or chest pain. No other palpitations.     Past Medical History  Diagnosis Date  . Arthritis   . Hypertension   . Neuromuscular disorder (HCC)     bacl pain  . Anxiety 2008    seen in ED /w chest pain but told that she was having anxiety  . Sleep difficulties     has had a sleep study - t old that she didn't meet criteria for machine   . Chronic kidney disease 1999    passed kidney stones spontaneously  . Hives of unknown origin     told by PCP Terri Piedra) to use Zrytec BID to control hives   . History of stress test 2010    done due to chest pain /w  Dr. Jacinto Halim. Pt. told that it was wnl.  . Syncope     Past Surgical History  Procedure Laterality Date  . Knee arthroscopy  12/2008    left knee  . Partial hysterectomy  10/1994  . Thumb surgery  03/1975  . Decompressive lumbar laminectomy level 1 N/A 10/28/2013    Procedure: DECOMPRESSION L4-5;  Surgeon: Venita Lick, MD;  Location: [facility] OR;  Service: Orthopedics;  Laterality: N/A;     Current Outpatient Prescriptions  Medication Sig Dispense Refill  . ALPRAZolam (XANAX) 0.25 MG tablet Take 0.25 mg by mouth 2 (two) times daily.    . cefdinir (OMNICEF) 300 MG capsule Take 300 mg by mouth 2 (two) times daily.    . cetirizine (ZYRTEC) 10 MG tablet Take 10 mg by mouth 2 (two) times daily.    Marland Kitchen diltiazem (DILACOR XR) 180 MG 24 hr capsule Take 180 mg by mouth daily.    Marland Kitchen gabapentin (NEURONTIN) 600 MG tablet Take 900 mg by mouth 2 (two) times daily.  2  . predniSONE (DELTASONE) 20 MG tablet Take 20 mg by mouth 3 (  three) times daily.     No current facility-administered medications for this visit.    Allergies:   Penicillins    Social History:  The patient  reports that she has never smoked. She does not have any smokeless tobacco history on file. She reports that she does not drink alcohol or use illicit drugs.   Family History:  The patient's family history includes Cancer in her other; Diabetes in her other; Heart disease in her other; Hyperlipidemia in her other; Hypertension in her other; Stroke in her father.    ROS:  Please see the history of present illness.   Otherwise, review of systems are positive for chronic hives.   All other systems are reviewed and negative.    PHYSICAL EXAM: VS:  BP 164/82 mmHg  Pulse 77  Ht 5' (1.524 m)  Wt 115.939 kg (255 lb 9.6 oz)  BMI 49.92 kg/m2 , BMI Body mass index is 49.92 kg/(m^2). GEN: morbidly obese WF, in no acute distress HEENT: normal Neck: no JVD, carotid bruits, or masses Cardiac: RRR; no murmurs, rubs, or gallops,no edema    Respiratory:  clear to auscultation bilaterally, normal work of breathing GI: soft, nontender, nondistended, + BS MS: no deformity or atrophy Skin: warm and dry, no rash Neuro:  Strength and sensation are intact Psych: euthymic mood, full affect   EKG:  EKG is not ordered today. The ekg done 12/03/14  demonstrates NSR with incomplete RBBB   Recent Labs: 12/03/2014: BUN 13; Creatinine, Ser 0.66; Hemoglobin 15.0; Platelets 226; Potassium 3.7; Sodium 141    Lipid Panel No results found for: CHOL, TRIG, HDL, CHOLHDL, VLDL, LDLCALC, LDLDIRECT    Wt Readings from Last 3 Encounters:  02/17/15 115.939 kg (255 lb 9.6 oz)  12/03/14 113.399 kg (250 lb)  10/28/13 116.745 kg (257 lb 6 oz)      Other studies Reviewed: Additional studies/ records that were reviewed today include: none. Review of the above records demonstrates: N/A   ASSESSMENT AND PLAN:  1.  Syncope/near syncope. Symptoms most consistent with vasovagal syncope. Will have her wear an event monitor to rule out arrhythmia with episodes. Since symptoms sporadic a Holter monitor is inadequate. Will request copies of prior cardiac work up. Follow up in 2 monts.  2. Morbid obesity. No history of sleep apnea by sleep study.  3. HTN   Current medicines are reviewed at length with the patient today.  The patient does not have concerns regarding medicines.  The following changes have been made:  no change  Labs/ tests ordered today include:  Orders Placed This Encounter  Procedures  . Cardiac event monitor     Disposition:   FU with Dr. SwazilandJordan in 2 months  Signed, Jailon Schaible Swazil[place]Jordan, MD  02/17/2015 1:05 PM    [facility] [address], [place], [place], [id number] Phone [phone], Fax [phone]

## 2015-02-17 NOTE — Patient Instructions (Signed)
We will have you wear an event monitor.  I will see you in 2 months to review  We will get your old records from Dr. Jacinto HalimGanji

## 2015-02-22 ENCOUNTER — Ambulatory Visit (INDEPENDENT_AMBULATORY_CARE_PROVIDER_SITE_OTHER): Payer: 59

## 2015-02-22 DIAGNOSIS — R55 Syncope and collapse: Secondary | ICD-10-CM

## 2015-02-28 ENCOUNTER — Encounter: Payer: Self-pay | Admitting: Cardiology

## 2015-03-03 ENCOUNTER — Other Ambulatory Visit: Payer: Self-pay | Admitting: Cardiology

## 2015-03-28 ENCOUNTER — Telehealth: Payer: Self-pay | Admitting: Cardiology

## 2015-03-28 NOTE — Telephone Encounter (Signed)
Returned call to patient.She stated she wanted Dr.Jordan to know she returned monitor last Tue 03/22/15.Stated she returned monitor 2 days early due to electrodes breaking skin out.Stated since she returned monitor she has had 2 episodes of fast heart beat.Stated each episode starts by everything spinning,becomes clammy,perspires heavy,severe headache.Stated she has to lay down.Stated both episodes lasted appox 30 to 35 min.Stated the last episode she had a bowel movement without her control.Stated she did not black out with either episode.Stated these episodes were the worst she has had.Stated while she was wearing monitor she only had a couple of episodes of fast heart beat.She feels achy and no energy this morning no fast heart beat.Advised Dr.Jordan out of office.I will send message to him.

## 2015-03-28 NOTE — Telephone Encounter (Signed)
Pt have had 2 passing out spells,also had a bowel movement on herself. She just turned her monitor in last Wednesday.

## 2015-03-30 ENCOUNTER — Other Ambulatory Visit: Payer: Self-pay

## 2015-03-30 DIAGNOSIS — R55 Syncope and collapse: Secondary | ICD-10-CM

## 2015-03-31 NOTE — Telephone Encounter (Signed)
Returned call to patient advised she has [facility].Advised Melissa in EP left you a message to call her to schedule EP appointment.Stated she will call Melissa to schedule.

## 2015-03-31 NOTE — Telephone Encounter (Signed)
Pt said she missed a call this morning,she thought it might have been you.

## 2015-04-08 ENCOUNTER — Other Ambulatory Visit: Payer: Self-pay

## 2015-04-08 ENCOUNTER — Ambulatory Visit (HOSPITAL_COMMUNITY): Payer: 59 | Attending: Cardiology

## 2015-04-08 DIAGNOSIS — I129 Hypertensive chronic kidney disease with stage 1 through stage 4 chronic kidney disease, or unspecified chronic kidney disease: Secondary | ICD-10-CM | POA: Diagnosis not present

## 2015-04-08 DIAGNOSIS — Z6841 Body Mass Index (BMI) 40.0 and over, adult: Secondary | ICD-10-CM | POA: Insufficient documentation

## 2015-04-08 DIAGNOSIS — I517 Cardiomegaly: Secondary | ICD-10-CM | POA: Insufficient documentation

## 2015-04-08 DIAGNOSIS — N189 Chronic kidney disease, unspecified: Secondary | ICD-10-CM | POA: Diagnosis not present

## 2015-04-08 DIAGNOSIS — R55 Syncope and collapse: Secondary | ICD-10-CM | POA: Diagnosis present

## 2015-04-13 ENCOUNTER — Ambulatory Visit (INDEPENDENT_AMBULATORY_CARE_PROVIDER_SITE_OTHER): Payer: 59 | Admitting: Internal Medicine

## 2015-04-13 ENCOUNTER — Encounter: Payer: Self-pay | Admitting: Internal Medicine

## 2015-04-13 VITALS — BP 124/70 | HR 68 | Ht 60.0 in | Wt 264.6 lb

## 2015-04-13 DIAGNOSIS — R55 Syncope and collapse: Secondary | ICD-10-CM | POA: Diagnosis not present

## 2015-04-13 NOTE — Patient Instructions (Signed)
Medication Instructions: - no changes  Labwork: - none  Procedures/Testing: - none  Follow-Up: - Dr. Klein will see you back on an as needed basis.  Any Additional Special Instructions Will Be Listed Below (If Applicable).   
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
## 2015-04-18 NOTE — Progress Notes (Signed)
Unable to route office note, PCP not in system.

## 2015-05-25 ENCOUNTER — Ambulatory Visit (INDEPENDENT_AMBULATORY_CARE_PROVIDER_SITE_OTHER): Payer: 59 | Admitting: Cardiology

## 2015-05-25 ENCOUNTER — Encounter: Payer: Self-pay | Admitting: Cardiology

## 2015-05-25 VITALS — BP 164/80 | HR 68 | Ht 60.0 in | Wt 260.7 lb

## 2015-05-25 DIAGNOSIS — I10 Essential (primary) hypertension: Secondary | ICD-10-CM

## 2015-05-25 DIAGNOSIS — R55 Syncope and collapse: Secondary | ICD-10-CM

## 2015-05-25 NOTE — Progress Notes (Signed)
Cardiology Office Note   Date:  05/25/2015   ID:  [name] [name], DOB March 15, 1961, MRN [id number]  PCP:  No PCP Per Patient  Cardiologist:   Nidhi Jacome [place], MD   Chief Complaint  Patient presents with  . 2 month visit    pt states no Sx. or concerns      History of Present Illness: [name] [name] is a 55 y.o. female who presents for follow up of syncope. She is self referred. She is a morbidly obese WF with history of HTN.  Episodes of syncope date back at least to 2010. She had an Echo and stress test at that time with Dr. Jacinto Halim. She denies any SOB or chest pain. No other palpitations. She was seen by Dr. Graciela Husbands in December who felt her symptoms were consistent with neurocardiogenic syncope. She describes a tilt table test but I do not see a report of this. She notes that since she stopped taking Neurotin for back pain her dizziness has resolved.   Past Medical History  Diagnosis Date  . Arthritis   . Hypertension   . Neuromuscular disorder (HCC)     bacl pain  . Anxiety 2008    seen in ED /w chest pain but told that she was having anxiety  . Sleep difficulties     has had a sleep study - t old that she didn't meet criteria for machine   . Chronic kidney disease 1999    passed kidney stones spontaneously  . Hives of unknown origin     told by PCP Terri Piedra) to use Zrytec BID to control hives   . History of stress test 2010    done due to chest pain /w Dr. Jacinto Halim. Pt. told that it was wnl.  . Syncope     Past Surgical History  Procedure Laterality Date  . Knee arthroscopy  12/2008    left knee  . Partial hysterectomy  10/1994  . Thumb surgery  03/1975  . Decompressive lumbar laminectomy level 1 N/A 10/28/2013    Procedure: DECOMPRESSION L4-5;  Surgeon: Venita Lick, MD;  Location: [facility] OR;  Service: Orthopedics;  Laterality: N/A;     Current Outpatient Prescriptions  Medication Sig Dispense Refill  . ALPRAZolam (XANAX) 0.25 MG tablet Take 0.25 mg by mouth 2 (two) times  daily.    . cetirizine (ZYRTEC) 10 MG tablet Take 10 mg by mouth 2 (two) times daily.    Marland Kitchen diltiazem (CARDIZEM CD) 180 MG 24 hr capsule TAKE ONE CAPSULE BY MOUTH EVERY DAY. 90 capsule 3   No current facility-administered medications for this visit.    Allergies:   Loratadine and Penicillins    Social History:  The patient  reports that she has never smoked. She does not have any smokeless tobacco history on file. She reports that she does not drink alcohol or use illicit drugs.   Family History:  The patient's family history includes Cancer in her other; Diabetes in her other; Heart disease in her other; Hyperlipidemia in her other; Hypertension in her other; Stroke in her father.    ROS:  Please see the history of present illness.    All other systems are reviewed and negative.    PHYSICAL EXAM: VS:  BP 164/80 mmHg  Pulse 68  Ht 5' (1.524 m)  Wt 118.253 kg (260 lb 11.2 oz)  BMI 50.91 kg/m2 , BMI Body mass index is 50.91 kg/(m^2). GEN: morbidly obese WF, in no acute distress  HEENT: normal Neck: no JVD, carotid bruits, or masses Cardiac: RRR; no murmurs, rubs, or gallops,no edema  Respiratory:  clear to auscultation bilaterally, normal work of breathing GI: soft, nontender, nondistended, + BS MS: no deformity or atrophy Skin: warm and dry, no rash Neuro:  Strength and sensation are intact Psych: euthymic mood, full affect   EKG:  EKG is not ordered today. The ekg done 12/03/14  demonstrates NSR with incomplete RBBB   Recent Labs: 12/03/2014: BUN 13; Creatinine, Ser 0.66; Hemoglobin 15.0; Platelets 226; Potassium 3.7; Sodium 141    Lipid Panel No results found for: CHOL, TRIG, HDL, CHOLHDL, VLDL, LDLCALC, LDLDIRECT    Wt Readings from Last 3 Encounters:  05/25/15 118.253 kg (260 lb 11.2 oz)  04/13/15 120.022 kg (264 lb 9.6 oz)  02/17/15 115.939 kg (255 lb 9.6 oz)      Other studies Reviewed: Additional studies/ records that were reviewed today include: none. Review  of the above records demonstrates: N/A   ASSESSMENT AND PLAN:  1.  Syncope/near syncope. Per EP evaluation this is felt to Neurocardiogenic. Symptoms most consistent with vasovagal syncope. Reviewed avoidance measures with her. Maintain good hydration. If symptoms occur get down quickly. Symptoms now improved. Will follow up in one year.  2. Morbid obesity. No history of sleep apnea by sleep study.  3. HTN BP elevated today but review of readings in last couple of months have been OK. Reluctant to give additional meds due to #1. Needs to get established with primary care.   Current medicines are reviewed at length with the patient today.  The patient does not have concerns regarding medicines.  The following changes have been made:  no change  Labs/ tests ordered today include:  No orders of the defined types were placed in this encounter.     Disposition:   FU with Dr. Swaziland in one year.  Signed, [name] Rung [place], MD  05/25/2015 3:23 PM    [facility] [address], [place], [place], [address] Phone [phone], Fax [phone]

## 2015-05-25 NOTE — Patient Instructions (Signed)
Continue your current therapy  I would recommend you get established with primary care. Monitor your blood pressure for now.   I will see you in one year.

## 2015-07-27 ENCOUNTER — Encounter: Payer: Self-pay | Admitting: Physician Assistant

## 2015-07-27 ENCOUNTER — Ambulatory Visit (INDEPENDENT_AMBULATORY_CARE_PROVIDER_SITE_OTHER): Payer: 59 | Admitting: Physician Assistant

## 2015-07-27 VITALS — BP 152/96 | HR 72 | Temp 97.9°F | Resp 18 | Ht 60.0 in | Wt 262.0 lb

## 2015-07-27 DIAGNOSIS — Z7689 Persons encountering health services in other specified circumstances: Secondary | ICD-10-CM

## 2015-07-27 DIAGNOSIS — R55 Syncope and collapse: Secondary | ICD-10-CM | POA: Diagnosis not present

## 2015-07-27 DIAGNOSIS — Z0000 Encounter for general adult medical examination without abnormal findings: Secondary | ICD-10-CM

## 2015-07-27 DIAGNOSIS — I10 Essential (primary) hypertension: Secondary | ICD-10-CM | POA: Diagnosis not present

## 2015-07-27 DIAGNOSIS — Z7189 Other specified counseling: Secondary | ICD-10-CM

## 2015-07-27 DIAGNOSIS — Z23 Encounter for immunization: Secondary | ICD-10-CM | POA: Diagnosis not present

## 2015-07-27 DIAGNOSIS — M545 Low back pain: Secondary | ICD-10-CM | POA: Diagnosis not present

## 2015-07-27 DIAGNOSIS — G4733 Obstructive sleep apnea (adult) (pediatric): Secondary | ICD-10-CM

## 2015-07-27 MED ORDER — LISINOPRIL 10 MG PO TABS: 10.0000 mg | ORAL_TABLET | Freq: Every day | ORAL | Status: DC

## 2015-07-28 LAB — COMPLETE METABOLIC PANEL WITH GFR
ALBUMIN: 4 g/dL (ref 3.6–5.1)
ALK PHOS: 105 U/L (ref 33–130)
ALT: 25 U/L (ref 6–29)
AST: 22 U/L (ref 10–35)
BILIRUBIN TOTAL: 0.4 mg/dL (ref 0.2–1.2)
BUN: 14 mg/dL (ref 7–25)
CO2: 26 mmol/L (ref 20–31)
CREATININE: 0.53 mg/dL (ref 0.50–1.05)
Calcium: 8.7 mg/dL (ref 8.6–10.4)
Chloride: 107 mmol/L (ref 98–110)
GFR, Est Non African American: >89 mL/min (ref >=60)
Glucose, Bld: 87 mg/dL (ref 70–99)
Potassium: 4 mmol/L (ref 3.5–5.3)
Sodium: 141 mmol/L (ref 135–146)
TOTAL PROTEIN: 6.1 g/dL (ref 6.1–8.1)

## 2015-07-28 LAB — TSH: TSH: 1.48 mIU/L

## 2015-07-28 LAB — CBC WITH DIFFERENTIAL/PLATELET
Basophils Absolute: 0 10*3/uL (ref 0.0–0.1)
Basophils Relative: 0 % (ref 0–1)
EOS ABS: 0.5 10*3/uL (ref 0.0–0.7)
Eosinophils Relative: 5 % (ref 0–5)
HEMATOCRIT: 43.7 % (ref 36.0–46.0)
Hemoglobin: 14.1 g/dL (ref 12.0–15.0)
Lymphocytes Relative: 24 % (ref 12–46)
Lymphs Abs: 2.4 10*3/uL (ref 0.7–4.0)
MCH: 30.7 pg (ref 26.0–34.0)
MCHC: 32.3 g/dL (ref 30.0–36.0)
MCV: 95.2 fL (ref 78.0–100.0)
MONOS PCT: 6 % (ref 3–12)
MPV: 10.5 fL (ref 8.6–12.4)
Monocytes Absolute: 0.6 10*3/uL (ref 0.1–1.0)
NEUTROS PCT: 65 % (ref 43–77)
Neutro Abs: 6.4 10*3/uL (ref 1.7–7.7)
PLATELETS: 305 10*3/uL (ref 150–400)
RBC: 4.59 MIL/uL (ref 3.87–5.11)
RDW: 13.5 % (ref 11.5–15.5)
WBC: 9.9 10*3/uL (ref 4.0–10.5)

## 2015-07-28 LAB — VITAMIN D 25 HYDROXY (VIT D DEFICIENCY, FRACTURES): VIT D 25 HYDROXY: 14 ng/mL — AB (ref 30–100)

## 2015-07-28 NOTE — Progress Notes (Addendum)
Patient ID: [name] [name] MRN: [id number], DOB: 12-31-1960, 55 y.o. Date of Encounter: 07/28/2015,   Chief Complaint: Physical (CPE)  HPI: 55 y.o. y/o female  here for CPE.   She is also being seen as a new patient to establish care.  She states that her last PCP was with [name] [name] but last visit there was 2015. That clinic has recently closed so establishing care here.  She is not fasting today but does have a copy of lipid panel done at work last week that showed triglyceride 121, HDL 50, LDL 89. I discussed doing other labs today she states that yes she does want to go ahead and get everything up-to-date and evaluated.  She recently had hearing test at her work that was abnormal but they told her that there was wax in her ears, that she is to get that cleaned today, and then she says that work is planning to retest her hearing in April.  She makes reference to having evaluation with Cardiology but says that "after all that we found out it was my gabapentin causing the dizziness--I can't take gabapentin ". I added Gabapentin to "Allergy" list today.   Also mentioned that I saw obstructive sleep apnea in her chart and she says that she "was tested for this--but the test was negative"-- per patient.  I told her that her blood pressure is running high today and she says that she does check it at work and it has been running 140 to 150s over 80s to 90s.  No other specific complaints or concerns today.   Review of Systems: Consitutional: No fever, chills, fatigue, night sweats, lymphadenopathy. No significant/unexplained weight changes. Eyes: No visual changes, eye redness, or discharge. ENT/Mouth: No ear pain, sore throat, nasal drainage, or sinus pain. Cardiovascular: No chest pressure,heaviness, tightness or squeezing, even with exertion. No increased shortness of breath or dyspnea on exertion.No palpitations, edema, orthopnea, PND. Respiratory: No cough, hemoptysis, SOB, or  wheezing. Gastrointestinal: No anorexia, dysphagia, reflux, pain, nausea, vomiting, hematemesis, diarrhea, constipation, BRBPR, or melena. Breast: No mass, nodules, bulging, or retraction. No skin changes or inflammation. No nipple discharge. No lymphadenopathy. Genitourinary: No dysuria, hematuria, incontinence, vaginal discharge, pruritis, burning, abnormal bleeding, or pain. Musculoskeletal: No decreased ROM, No joint pain or swelling. No significant pain in neck, back, or extremities. Skin: No rash, pruritis, or concerning lesions. Neurological: No headache, dizziness, syncope, seizures, tremors, memory loss, coordination problems, or paresthesias. Psychological: No anxiety, depression, hallucinations, SI/HI. Endocrine: No polydipsia, polyphagia, polyuria, or known diabetes.No increased fatigue. No palpitations/rapid heart rate. No significant/unexplained weight change. All other systems were reviewed and are otherwise negative.  Past Medical History  Diagnosis Date  . Arthritis   . Hypertension   . Neuromuscular disorder (HCC)     bacl pain  . Anxiety 2008    seen in ED /w chest pain but told that she was having anxiety  . Sleep difficulties     has had a sleep study - t old that she didn't meet criteria for machine   . Chronic kidney disease 1999    passed kidney stones spontaneously  . Hives of unknown origin     told by PCP [name] [name]) to use Zrytec BID to control hives   . History of stress test 2010    done due to chest pain /w [name] [name]. Pt. told that it was wnl.  . Syncope      Past Surgical History  Procedure Laterality Date  .  Knee arthroscopy  12/2008    left knee  . Partial hysterectomy  10/1994  . Thumb surgery  03/1975  . Decompressive lumbar laminectomy level 1 N/A 10/28/2013    Procedure: DECOMPRESSION L4-5;  Surgeon: [name] Lick, [name] [name];  Location: [facility] OR;  Service: Orthopedics;  Laterality: N/A;    Home Meds:  Outpatient Prescriptions Prior to Visit  Medication  Sig Dispense Refill  . cetirizine (ZYRTEC) 10 MG tablet Take 10 mg by mouth 2 (two) times daily.    Marland Kitchen diltiazem (CARDIZEM CD) 180 MG 24 hr capsule TAKE ONE CAPSULE BY MOUTH EVERY DAY. 90 capsule 3  . ALPRAZolam (XANAX) 0.25 MG tablet Take 0.25 mg by mouth 2 (two) times daily. Reported on 07/27/2015     No facility-administered medications prior to visit.    Allergies:  Allergies  Allergen Reactions  . Loratadine Rash    Rash and hives   . Penicillins Hives and Rash    Social History   Social History  . Marital Status: Single    Spouse Name: N/A  . Number of Children: 0  . Years of Education: N/A   Occupational History  . Radiographer, therapeutic    Social History Main Topics  . Smoking status: Never Smoker   . Smokeless tobacco: Not on file  . Alcohol Use: No  . Drug Use: No  . Sexual Activity: Not on file   Other Topics Concern  . Not on file   Social History Narrative    Family History  Problem Relation Age of Onset  . Stroke Father   . Diabetes Other   . Hyperlipidemia Other   . Heart disease Other   . Hypertension Other   . Cancer Other     Physical Exam: Blood pressure 152/96, pulse 72, temperature 97.9 F (36.6 C), temperature source Oral, resp. rate 18, height 5' (1.524 m), weight 262 lb (118.842 kg)., Body mass index is 51.17 kg/(m^2). General: Well developed, well nourished, [name] [name]. Appears in no acute distress. HEENT: Normocephalic, atraumatic. Conjunctiva pink, sclera non-icteric. Pupils 2 mm constricting to 1 mm, round, regular, and equally reactive to light and accomodation. EOMI. Right ear canal obstructed with cerumen impaction. Left ear canal patent. Left TM  with good cone of light and without pathology. Nasal mucosa pink. Nares are without discharge. No sinus tenderness. Oral mucosa pink.  Pharynx without exudate.   Neck: Supple. Trachea midline. No thyromegaly. Full ROM. No lymphadenopathy.No Carotid Bruits. Lungs: Clear to auscultation  bilaterally without wheezes, rales, or rhonchi. Breathing is of normal effort and unlabored. Cardiovascular: RRR with S1 S2. No murmurs, rubs, or gallops. Distal pulses 2+ symmetrically. No carotid or abdominal bruits. Breast: Symmetrical. No masses. Nipples without discharge. Abdomen: Soft, non-tender, non-distended with normoactive bowel sounds. No hepatosplenomegaly or masses. No rebound/guarding. No CVA tenderness. No hernias.  Genitourinary:  External genitalia without lesions. Vaginal mucosa pink.No discharge present. Bimanual exam c/w prior hysterectomy, exam normal.  Musculoskeletal: Full range of motion and 5/5 strength throughout. Without swelling, atrophy, tenderness, crepitus, or warmth. Extremities without clubbing, cyanosis, or edema. Calves supple. Skin: Warm and moist without erythema, ecchymosis, wounds, or rash. Neuro: A+Ox3. CN II-XII grossly intact. Moves all extremities spontaneously. Full sensation throughout. Normal gait. DTR 2+ throughout upper and lower extremities. Finger to nose intact. Psych:  Responds to questions appropriately with a normal affect.   Assessment/Plan:  55 y.o. y/o female here for CPE  1. Visit for preventive health examination  A. Screening Labs:  She is not fasting  today but does have a copy of lipid panel done at work last week that showed triglyceride 121, HDL 50, LDL 89. I discussed doing other labs today she states that yes she does want to go ahead and get everything up-to-date and evaluated.  - CBC with Differential/Platelet - COMPLETE METABOLIC PANEL WITH GFR - TSH - VITAMIN D 25 Hydroxy (Vit-D Deficiency, Fractures)  B. Pap: She reports that she has had a hysterectomy and does not have history of cancer--- hysterectomy was not performed secondary to cancer. Therefore further Pap smears not indicated. Pelvic exam normal today.  C. Screening Mammogram: She reports that she had a mammogram performed October 2016. States that a mobile  unit came to her work.  D. DEXA/BMD:  We did not not discuss DEXA scan at this visit. Will discuss at follow-up future visit--- usually wait until closer to age 55 to start this but will discuss what age her hysterectomy was and whether this was a complete hysterectomy as far as menopause and estrogen deficiency.  E. Colorectal Cancer Screening: She states that she had a colonoscopy by [name] [name] in 2016 which showed 2 polyps and she is to repeat 5 years. This will be due 2021.  F. Immunizations:  Influenza:  N/A--March 29th Tetanus:   She reports that she had tetanus vaccine December 2016 Pneumococcal:    She has no indication to require a pneumonia vaccine until age 55 Zostavax:  Not indicated until age 55  2. Encounter to establish care   3. Essential hypertension I told her that her blood pressure is running high today and she says that she does check it at work and it has been running 140 to 150s over 80s to 90s. She is to start lisinopril 10 mg daily. F/U office visit 2 weeks to recheck blood pressure and BMET. - lisinopril (PRINIVIL,ZESTRIL) 10 MG tablet; Take 1 tablet (10 mg total) by mouth daily.  Dispense: 30 tablet; Refill: 0  Right Cerumen Impaction This was irrigated today. She is to have follow-up repeat hearing test at work.  Morbid obesity due to excess calories [facility](HCC) We will further discuss proper diet and exercise at follow-up office visit.   Obstructive sleep apnea Patient reports that she was evaluated for sleep apnea but the test was negative. At follow-up visit will actually read these records to confirm whether this is accurate information.  Bilateral low back pain, with sciatica presence unspecified Cannot take gabapentin as this causes dizziness. I have added this to the allergy list today.  H/O-- Syncope, unspecified syncope type This was already documented in her chart. She has had cardiology evaluation for this. I have not had time to review those  records but will do so in the future. Pt reports today she states that she went through cardiology evaluation but it turned out that it was the gabapentin causing her dizziness.   Follow up office visit 2 weeks or sooner if needed.   [address], [name] [name], [name] [name], [facility] 07/28/2015 7:33 AM

## 2015-08-02 ENCOUNTER — Other Ambulatory Visit: Payer: Self-pay | Admitting: Family Medicine

## 2015-08-02 DIAGNOSIS — E559 Vitamin D deficiency, unspecified: Secondary | ICD-10-CM

## 2015-08-02 MED ORDER — VITAMIN D (ERGOCALCIFEROL) 1.25 MG (50000 UNIT) PO CAPS: 50000.0000 [IU] | ORAL_CAPSULE | ORAL | Status: DC

## 2015-08-10 ENCOUNTER — Ambulatory Visit (INDEPENDENT_AMBULATORY_CARE_PROVIDER_SITE_OTHER): Payer: 59 | Admitting: Physician Assistant

## 2015-08-10 ENCOUNTER — Encounter: Payer: Self-pay | Admitting: Physician Assistant

## 2015-08-10 VITALS — BP 134/78 | HR 80 | Temp 97.9°F | Resp 18 | Wt 258.0 lb

## 2015-08-10 DIAGNOSIS — I10 Essential (primary) hypertension: Secondary | ICD-10-CM

## 2015-08-10 DIAGNOSIS — E559 Vitamin D deficiency, unspecified: Secondary | ICD-10-CM | POA: Diagnosis not present

## 2015-08-10 LAB — BASIC METABOLIC PANEL WITH GFR
BUN: 17 mg/dL (ref 7–25)
CALCIUM: 8.7 mg/dL (ref 8.6–10.4)
CO2: 25 mmol/L (ref 20–31)
CREATININE: 0.62 mg/dL (ref 0.50–1.05)
Chloride: 106 mmol/L (ref 98–110)
GLUCOSE: 105 mg/dL — AB (ref 70–99)
Potassium: 4.5 mmol/L (ref 3.5–5.3)
Sodium: 139 mmol/L (ref 135–146)

## 2015-08-10 MED ORDER — LISINOPRIL 10 MG PO TABS: 10.0000 mg | ORAL_TABLET | Freq: Every day | ORAL | Status: DC

## 2015-08-10 NOTE — Progress Notes (Addendum)
Patient ID: [name] [name] MRN: [id number], DOB: [birth year], 55 y.o. Date of Encounter: 08/10/2015,   Chief Complaint: F/U HTN  HPI: 55 y.o. y/o female  here for f/u HTN.  07/27/2015: Here for CPE.  She is also being seen as a new patient to establish care.  She states that her last PCP was with Dr. Yehuda Budd but last visit there was 2015. That clinic has recently closed so establishing care here.  She is not fasting today but does have a copy of lipid panel done at work last week that showed triglyceride 121, HDL 50, LDL 89. I discussed doing other labs today she states that yes she does want to go ahead and get everything up-to-date and evaluated.  She recently had hearing test at her work that was abnormal but they told her that there was wax in her ears, that she is to get that cleaned today, and then she says that work is planning to retest her hearing in April.  She makes reference to having evaluation with Cardiology but says that "after all that we found out it was my gabapentin causing the dizziness--I can't take gabapentin ". I added Gabapentin to "Allergy" list today.   Also mentioned that I saw obstructive sleep apnea in her chart and she says that she "was tested for this--but the test was negative"-- per patient.  I told her that her blood pressure is running high today and she says that she does check it at work and it has been running 140 to 150s over 80s to 90s.  No other specific complaints or concerns today.  At that OV, Started Lisinopril  QD  08/10/2015: She reports that she did add the lisinopril 10 mg daily as directed. Having no adverse effects. Checking blood pressure at work and getting similar readings as today's reading of 134/78. She reports that she did start taking the prescription strength vitamin D as recommended at lab results from last visit. No complaints or concerns today.   Review of Systems: Consitutional: No fever, chills, fatigue, night  sweats, lymphadenopathy. No significant/unexplained weight changes. Eyes: No visual changes, eye redness, or discharge. ENT/Mouth: No ear pain, sore throat, nasal drainage, or sinus pain. Cardiovascular: No chest pressure,heaviness, tightness or squeezing, even with exertion. No increased shortness of breath or dyspnea on exertion.No palpitations, edema, orthopnea, PND. Respiratory: No cough, hemoptysis, SOB, or wheezing. Gastrointestinal: No anorexia, dysphagia, reflux, pain, nausea, vomiting, hematemesis, diarrhea, constipation, BRBPR, or melena. Breast: No mass, nodules, bulging, or retraction. No skin changes or inflammation. No nipple discharge. No lymphadenopathy. Genitourinary: No dysuria, hematuria, incontinence, vaginal discharge, pruritis, burning, abnormal bleeding, or pain. Musculoskeletal: No decreased ROM, No joint pain or swelling. No significant pain in neck, back, or extremities. Skin: No rash, pruritis, or concerning lesions. Neurological: No headache, dizziness, syncope, seizures, tremors, memory loss, coordination problems, or paresthesias. Psychological: No anxiety, depression, hallucinations, SI/HI. Endocrine: No polydipsia, polyphagia, polyuria, or known diabetes.No increased fatigue. No palpitations/rapid heart rate. No significant/unexplained weight change. All other systems were reviewed and are otherwise negative.  Past Medical History  Diagnosis Date  . Arthritis   . Hypertension   . Neuromuscular disorder (HCC)     bacl pain  . Anxiety 2008    seen in ED /w chest pain but told that she was having anxiety  . Sleep difficulties     has had a sleep study - t old that she didn't meet criteria for machine   . Chronic kidney disease  1999    passed kidney stones spontaneously  . Hives of unknown origin     told by PCP Terri Piedra( [place]) to use Zrytec BID to control hives   . History of stress test 2010    done due to chest pain /w Dr. Jacinto HalimGanji. Pt. told that it was wnl.  .  Syncope      Past Surgical History  Procedure Laterality Date  . Knee arthroscopy  12/2008    left knee  . Partial hysterectomy  10/1994  . Thumb surgery  03/1975  . Decompressive lumbar laminectomy level 1 N/A 10/28/2013    Procedure: DECOMPRESSION L4-5;  Surgeon: Venita Lickahari Brooks, MD;  Location: [facility] OR;  Service: Orthopedics;  Laterality: N/A;    Home Meds:  Outpatient Prescriptions Prior to Visit  Medication Sig Dispense Refill  . cetirizine (ZYRTEC) 10 MG tablet Take 10 mg by mouth 2 (two) times daily.    Marland Kitchen. diltiazem (CARDIZEM CD) 180 MG 24 hr capsule TAKE ONE CAPSULE BY MOUTH EVERY DAY. 90 capsule 3  . Vitamin D, Ergocalciferol, (DRISDOL) 50000 units CAPS capsule Take 1 capsule (50,000 Units total) by mouth every 7 (seven) days. 12 capsule 0  . ALPRAZolam (XANAX) 0.25 MG tablet Take 0.25 mg by mouth 2 (two) times daily. Reported on 07/27/2015    . lisinopril (PRINIVIL,ZESTRIL) 10 MG tablet Take 1 tablet (10 mg total) by mouth daily. 30 tablet 0  . naproxen sodium (ANAPROX) 220 MG tablet Take 220 mg by mouth 2 (two) times daily as needed.     No facility-administered medications prior to visit.    Allergies:  Allergies  Allergen Reactions  . Gabapentin     Dizziness  . Loratadine Rash    Rash and hives   . Penicillins Hives and Rash    Social History   Social History  . Marital Status: Single    Spouse Name: N/A  . Number of Children: 0  . Years of Education: N/A   Occupational History  . Radiographer, therapeuticmanufacturing technician    Social History Main Topics  . Smoking status: Never Smoker   . Smokeless tobacco: Not on file  . Alcohol Use: No  . Drug Use: No  . Sexual Activity: Not on file   Other Topics Concern  . Not on file   Social History Narrative    Family History  Problem Relation Age of Onset  . Stroke Father   . Diabetes Other   . Hyperlipidemia Other   . Heart disease Other   . Hypertension Other   . Cancer Other     Physical Exam: Blood pressure 134/78,  pulse 80, temperature 97.9 F (36.6 C), temperature source Oral, resp. rate 18, weight 258 lb (117.028 kg)., Body mass index is 50.39 kg/(m^2). General: Well developed, well nourished, WF. Appears in no acute distress. Neck: Supple. Trachea midline. No thyromegaly. Full ROM. No lymphadenopathy.No Carotid Bruits. Lungs: Clear to auscultation bilaterally without wheezes, rales, or rhonchi. Breathing is of normal effort and unlabored. Cardiovascular: RRR with S1 S2. No murmurs, rubs, or gallops. Distal pulses 2+ symmetrically. No carotid or abdominal bruits. Musculoskeletal: Full range of motion and 5/5 strength throughout. Skin: Warm and moist without erythema, ecchymosis, wounds, or rash. Neuro: A+Ox3. CN II-XII grossly intact. Moves all extremities spontaneously. Full sensation throughout. Normal gait.  Psych:  Responds to questions appropriately with a normal affect.   Assessment/Plan:  55 y.o. y/o female here for  1. Essential hypertension Blood pressure is now at goal/controlled. Continue current  medications. Check labs to monitor since adding lisinopril. - BASIC METABOLIC PANEL WITH GFR - lisinopril (PRINIVIL,ZESTRIL) 10 MG tablet; Take 1 tablet (10 mg total) by mouth daily.  Dispense: 90 tablet; Refill: 1  2. Vitamin D deficiency Labs performed 07/27/15 showed vitamin D deficiency with a vitamin D level at 14. At that time she was informed to take prescription strength 50,000 units once weekly for 12 weeks. When she completes this she is to take over-the-counter 5000 units daily. Reviewed this with her today and reviewed to make sure to take the over-the-counter strength when she completes the prescription strength. We'll recheck vitamin D level at follow-up visit in 6 months.  Follow-up office visit in 6 months or sooner if needed.  THE FOLLOWING IS COPIED FROM HER CPE 07/27/2015: 1. Visit for preventive health examination  A. Screening Labs:  She is not fasting today but does have a  copy of lipid panel done at work last week that showed triglyceride 121, HDL 50, LDL 89. I discussed doing other labs today she states that yes she does want to go ahead and get everything up-to-date and evaluated.  - CBC with Differential/Platelet - COMPLETE METABOLIC PANEL WITH GFR - TSH - VITAMIN D 25 Hydroxy (Vit-D Deficiency, Fractures)  B. Pap: She reports that she has had a hysterectomy and does not have history of cancer--- hysterectomy was not performed secondary to cancer. Therefore further Pap smears not indicated. Pelvic exam normal today.  C. Screening Mammogram: She reports that she had a mammogram performed October 2016. States that a mobile unit came to her work.  D. DEXA/BMD:  We did not not discuss DEXA scan at this visit. Will discuss at follow-up future visit--- usually wait until closer to age 64 to start this but will discuss what age her hysterectomy was and whether this was a complete hysterectomy as far as menopause and estrogen deficiency.  E. Colorectal Cancer Screening: She states that she had a colonoscopy by Dr. Loreta Ave in 2016 which showed 2 polyps and she is to repeat 5 years. This will be due 2021. ADDENDUM ADDED 08/18/2015: Received record from Dr. Loreta Ave Colonoscopy performed 10/20/2014. One polyp. That report stated repeat colonoscopy 5-10 years. She did also send a copy of the pathology report.  Dr. Kenna Gilbert note on that:  "Repeat 5 years."  F. Immunizations:  Influenza:  N/A--March 29th Tetanus:   She reports that she had tetanus vaccine December 2016 Pneumococcal:    She has no indication to require a pneumonia vaccine until age 74 Zostavax:  Not indicated until age 57   Right Cerumen Impaction This was irrigated today. She is to have follow-up repeat hearing test at work.  Morbid obesity due to excess calories [facility]) We will further discuss proper diet and exercise at follow-up office visit.   Obstructive sleep apnea Patient reports that she was  evaluated for sleep apnea but the test was negative. At follow-up visit will actually read these records to confirm whether this is accurate information.  Bilateral low back pain, with sciatica presence unspecified Cannot take gabapentin as this causes dizziness. I have added this to the allergy list today.  H/O-- Syncope, unspecified syncope type This was already documented in her chart. She has had cardiology evaluation for this. I have not had time to review those records but will do so in the future. Pt reports today she states that she went through cardiology evaluation but it turned out that it was the gabapentin causing her dizziness.  Follow up office visit 6 months or sooner if needed.   Signed, [address] [place], [place], [facility] 08/10/2015 10:19 AM

## 2015-08-11 ENCOUNTER — Encounter: Payer: Self-pay | Admitting: Family Medicine

## 2015-11-07 ENCOUNTER — Encounter: Payer: Self-pay | Admitting: Family Medicine

## 2015-11-07 ENCOUNTER — Encounter: Payer: Self-pay | Admitting: Physician Assistant

## 2015-11-07 ENCOUNTER — Ambulatory Visit (INDEPENDENT_AMBULATORY_CARE_PROVIDER_SITE_OTHER): Payer: 59 | Admitting: Physician Assistant

## 2015-11-07 VITALS — BP 138/78 | HR 68 | Temp 98.1°F | Resp 18 | Wt 255.0 lb

## 2015-11-07 DIAGNOSIS — M5441 Lumbago with sciatica, right side: Secondary | ICD-10-CM

## 2015-11-07 MED ORDER — HYDROCODONE-ACETAMINOPHEN 5-325 MG PO TABS: 1.0000 | ORAL_TABLET | Freq: Four times a day (QID) | ORAL | Status: DC | PRN

## 2015-11-07 MED ORDER — CYCLOBENZAPRINE HCL 10 MG PO TABS: 10.0000 mg | ORAL_TABLET | Freq: Three times a day (TID) | ORAL | Status: DC | PRN

## 2015-11-07 MED ORDER — PREDNISONE 20 MG PO TABS: ORAL_TABLET | ORAL | Status: DC

## 2015-11-07 NOTE — Progress Notes (Signed)
Patient ID: KAMAIYAH USELTON MRN: [id number], DOB: June 15, 1960, 55 y.o. Date of Encounter: 11/07/2015, 2:50 PM    Chief Complaint:  Chief Complaint  Patient presents with  . back pain    "catch in my back since Thursday"  radiates down rt leg     HPI: 55 y.o. year old white female presents with above.  Reviewed that she had surgery on her back by Dr. Shon Baton in July 2015. She states that since then she had not had much problem with her back until this episode.  Says that last Monday morning which was July 3 when she got off work her back felt kind of tight and stiff. Says Tuesday, July 4 she stayed in bed all day. Every time she moved,  her back "locked up" and pain went down her right leg.  On Wednesday, July 5 she went to work with not much problem.  On Thursday, July 6 she was sitting in a chair at her desk --  when she got up to take some papers to another area, her back "caught "and couldn't straighten ". Says since then she has been using Tylenol and Aleve, alternating hot and cold, and trying to stretch.  Says when she walks and takes a step, it catches and pulls.  Says it hurt worse when she is sitting and also when she is walking.  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 Then starts over that process again.  Points to right low back at about L5 level as area of pain-- and down her right leg.     Home Meds:   Outpatient Prescriptions Prior to Visit  Medication Sig Dispense Refill  . cetirizine (ZYRTEC) 10 MG tablet Take 10 mg by mouth 2 (two) times daily.    Marland Kitchen diltiazem (CARDIZEM CD) 180 MG 24 hr capsule TAKE ONE CAPSULE BY MOUTH EVERY DAY. 90 capsule 3  . lisinopril (PRINIVIL,ZESTRIL) 10 MG tablet Take 1 tablet (10 mg total) by mouth daily. 90 tablet 1  . Vitamin D, Ergocalciferol, (DRISDOL) 50000 units CAPS capsule Take 1 capsule (50,000 Units total) by mouth every 7 (seven) days. 12  capsule 0   No facility-administered medications prior to visit.    Allergies:  Allergies  Allergen Reactions  . Gabapentin     Dizziness  . Loratadine Rash    Rash and hives   . Penicillins Hives and Rash      Review of Systems: See HPI for pertinent ROS. All other ROS negative.    Physical Exam: Blood pressure 138/78, pulse 68, temperature 98.1 F (36.7 C), temperature source Oral, resp. rate 18, weight 255 lb (115.667 kg)., Body mass index is 49.8 kg/(m^2). General:  WNWD WF. Appears in no acute distress. Neck: Supple. No thyromegaly. No lymphadenopathy. Lungs: Clear bilaterally to auscultation without wheezes, rales, or rhonchi. Breathing is unlabored. Heart: Regular rhythm. No murmurs, rubs, or gallops. Msk:  Strength and tone normal for age. Right Low Back at ~ L5 level is area of pain, tightness, tenderness with palpation.  She is alternating from sitting to standing positions through the visit.  Therefore I did not have her get on exam table to do straight leg raise and hip abduction as I did not want to flare symptoms and cause spasm.  She has 5/5 strength in the right leg and right foot.  2+ patellar reflex equal bilaterally. Extremities/Skin: Warm  and dry. Neuro: Alert and oriented X 3. Moves all extremities spontaneously. Gait is normal. CNII-XII grossly in tact. Psych:  Responds to questions appropriately with a normal affect.     ASSESSMENT AND PLAN:  55 y.o. year old female with  1. Right-sided low back pain with right-sided sciatica Note given to cover her being out of work last Thursday and Friday.  Says that she was already scheduled to be off work Saturday Sunday and today. She is scheduled to work again on Tuesday and Wednesday of this week 7/11 and 7/12.  She is then scheduled to be off on Thursday and Friday 7/13 and 7/14 and return to work Saturday, July 15. Today we have given her a note to cover her being off work from last Thursday, July 7  through Friday, July 14. Plan to return to work Saturday, July 15. If symptoms worsen follow-up immediately. If symptoms are not resolved by Friday of this week, she is to follow-up. She is informed that the Flexeril and hydrocodone can cause drowsiness and not to take prior to driving or operating machinery.  - predniSONE (DELTASONE) 20 MG tablet; Take 3 daily for 2 days, then 2 daily for 2 days, then 1 daily for 2 days.  Dispense: 12 tablet; Refill: 0 - cyclobenzaprine (FLEXERIL) 10 MG tablet; Take 1 tablet (10 mg total) by mouth 3 (three) times daily as needed for muscle spasms.  Dispense: 60 tablet; Refill: 1 - HYDROcodone-acetaminophen (NORCO/VICODIN) 5-325 MG tablet; Take 1 tablet by mouth every 6 (six) hours as needed.  Dispense: 30 tablet; Refill: 0   Signed, [address] Beth [place], [place], [facility] 11/07/2015 2:50 PM

## 2015-11-10 ENCOUNTER — Telehealth: Payer: Self-pay | Admitting: Physician Assistant

## 2015-11-10 NOTE — Telephone Encounter (Signed)
Pt left a message stating that she is still having a lot of back pain and is still unable to go to work. She is calling to see what she should do about coming back in or possibly getting a new work note. Please advise  [phone]

## 2015-11-11 ENCOUNTER — Encounter: Payer: Self-pay | Admitting: Family Medicine

## 2015-11-11 ENCOUNTER — Ambulatory Visit (INDEPENDENT_AMBULATORY_CARE_PROVIDER_SITE_OTHER): Payer: 59 | Admitting: Family Medicine

## 2015-11-11 VITALS — BP 134/82 | HR 78 | Temp 98.1°F | Resp 14 | Ht 60.0 in | Wt 261.0 lb

## 2015-11-11 DIAGNOSIS — Z9889 Other specified postprocedural states: Secondary | ICD-10-CM

## 2015-11-11 DIAGNOSIS — M545 Low back pain: Secondary | ICD-10-CM

## 2015-11-11 NOTE — Telephone Encounter (Signed)
Pt seen in office today.

## 2015-11-11 NOTE — Progress Notes (Signed)
Patient ID: [name] [name], female   DOB: 06/15/1960, 55 y.o.   MRN: [id number]    Subjective:    Patient ID: [name] Hakeonnie S Mahaney, female    DOB: 09/10/1960, 55 y.o.   MRN: [id number]  Patient presents for F/U Back Pain Patient here for continued right-sided back pain. She was seen on July 10 she has history of chronic back pain has has surgical intervention by Dr. Shon BatonBrooks in 2015. She stated around the holiday July 4 her back locked up on her and she started having pain moving down her right leg she does a lot of walking at work as well as sitting in between and this is been aggravating her back as well. The plan at that time was to give her a few days off of work which she will return to work on Saturday, July 15. She was also given hydrocodone for pain as well as Flexeril for muscle relaxant and a prednisone taper which has helped some. She is still having pain down right side with sudden movement Also occ, tingling in left foot Denies any change in bowel or bladder  Per the history she had decompressive lumbar laminectomy at L4-L5 she has metallic trochars L3-L5 per xray in 2015   Review Of Systems:  GEN- denies fatigue, fever, weight loss,weakness, recent illness CVS- denies chest pain, palpitations RESP- denies SOB, cough, wheeze ABD- denies N/V, change in stools, abd pain GU- denies dysuria, hematuria, dribbling, incontinence MSK- + joint pain, muscle aches, injury Neuro- denies headache, dizziness, syncope, seizure activity       Objective:    BP 134/82 mmHg  Pulse 78  Temp(Src) 98.1 F (36.7 C) (Oral)  Resp 14  Ht 5' (1.524 m)  Wt 261 lb (118.389 kg)  BMI 50.97 kg/m2 GEN- NAD, alert and oriented x3 MSK-  TTP Right paraspinals, +SLR right side, lumbar midline scar noted decreased ROM spine Neuro- Antalgic gait, sensation grossly in tact LE, normal tone , strength decreased RLE compared to left  EXT- No edema Pulses- Radial, DP- 2+        Assessment & Plan:      Problem  List Items Addressed This Visit    Back pain - Primary    Acute back pain with sciatica, concern for nerve root impingment affecting right side, has appt to see her back surgeon next week. I will defer imaging to them. She will continue with the prednisone and Flexeril hydrocodone which is helping some. Also take her out of work and she is walking constantly and also climbing up ladders which I think is very dangerous in the setting of her back pain. FMLA form to be completed for this. If time out of work       Other Visit Diagnoses    History of lumbar laminectomy for spinal cord decompression           Note: This dictation was prepared with Nurse, children'sDragon dictation along with smaller Lobbyistphrase technology. Any transcriptional errors that result from this process are unintentional.

## 2015-11-11 NOTE — Assessment & Plan Note (Signed)
Acute back pain with sciatica, concern for nerve root impingment affecting right side, has appt to see her back surgeon next week. I will defer imaging to them. She will continue with the prednisone and Flexeril hydrocodone which is helping some. Also take her out of work and she is walking constantly and also climbing up ladders which I think is very dangerous in the setting of her back pain. FMLA form to be completed for this. If time out of work

## 2015-11-11 NOTE — Patient Instructions (Signed)
FMLA to be faxed to my attention- Dr. Jeanice Limurham [phone] Continue current medication

## 2016-02-09 ENCOUNTER — Ambulatory Visit: Payer: 59 | Admitting: Physician Assistant

## 2016-02-15 ENCOUNTER — Encounter: Payer: Self-pay | Admitting: Physician Assistant

## 2016-02-15 ENCOUNTER — Ambulatory Visit (INDEPENDENT_AMBULATORY_CARE_PROVIDER_SITE_OTHER): Payer: 59 | Admitting: Physician Assistant

## 2016-02-15 VITALS — BP 138/64 | HR 66 | Temp 97.9°F | Resp 16 | Wt 261.0 lb

## 2016-02-15 DIAGNOSIS — G8929 Other chronic pain: Secondary | ICD-10-CM | POA: Diagnosis not present

## 2016-02-15 DIAGNOSIS — I10 Essential (primary) hypertension: Secondary | ICD-10-CM | POA: Diagnosis not present

## 2016-02-15 DIAGNOSIS — E559 Vitamin D deficiency, unspecified: Secondary | ICD-10-CM | POA: Diagnosis not present

## 2016-02-15 DIAGNOSIS — M545 Low back pain: Secondary | ICD-10-CM | POA: Diagnosis not present

## 2016-02-15 DIAGNOSIS — G4733 Obstructive sleep apnea (adult) (pediatric): Secondary | ICD-10-CM

## 2016-02-15 LAB — BASIC METABOLIC PANEL WITH GFR
BUN: 17 mg/dL (ref 7–25)
CHLORIDE: 108 mmol/L (ref 98–110)
CO2: 25 mmol/L (ref 20–31)
Calcium: 9.3 mg/dL (ref 8.6–10.4)
Creat: 0.8 mg/dL (ref 0.50–1.05)
GFR, EST NON AFRICAN AMERICAN: 83 mL/min (ref >=60)
Glucose, Bld: 80 mg/dL (ref 70–99)
Potassium: 4.6 mmol/L (ref 3.5–5.3)
SODIUM: 143 mmol/L (ref 135–146)

## 2016-02-15 NOTE — Progress Notes (Signed)
Patient ID: [name] [name] MRN: [id number], DOB: 11/20/1960, 55 y.o. Date of Encounter: @DATE @  Chief Complaint:  Chief Complaint  Patient presents with  . F/U Hypertension    f/u    HPI: 55 y.o. year old female  presents Routine follow-up visit to follow-up her hypertension, vitamin D deficiency and back pain.  Vitamin D deficiency ----At last lab check her vitamin D level was very low and so she was put on vitamin D supplements. Needs to recheck that lab.  Hypertension ----She is taking blood pressure medications as directed. No lightheadedness. No other adverse effects.  Low back pain---- I reviewed her office notes here from 11/07/15 and 11/11/15. She reports that after those visits she did have follow-up with Dr. Shon BatonBrooks. Had surgery to her low back by Dr. Shon BatonBrooks July 2015. Says that when she saw him for follow-up recently, he did not add any new treatment. He said that that bariatric surgery would be the only thing that would help. Says that he does not believe in pain medicines. Says that he is told her that this is just something to live with. Says that in the past prior to the back surgery she went through the whole bariatric weight loss program in preparation for bariatric surgery. Says that she saw all the different specialists, did all the things necessary but then at the last minute [employer] said that they would not pay for bariatric surgery. At the last minute she ended up having to change the plan and have back surgery instead. Says that she does not want to go through that whole process again and then find out that the bariatric surgery is denied. Has a "health coach " through work. Back pain is stable.  No other complaints or concerns today.   Past Medical History:  Diagnosis Date  . Anxiety 2008   seen in ED /w chest pain but told that she was having anxiety  . Arthritis   . Chronic kidney disease 1999   passed kidney stones spontaneously  . History of stress  test 2010   done due to chest pain /w Dr. Jacinto HalimGanji. Pt. told that it was wnl.  . Hives of unknown origin    told by PCP Terri Piedra( [place]) to use Zrytec BID to control hives   . Hypertension   . Neuromuscular disorder (HCC)    bacl pain  . Sleep difficulties    has had a sleep study - t old that she didn't meet criteria for machine   . Syncope      Home Meds: Outpatient Medications Prior to Visit  Medication Sig Dispense Refill  . cetirizine (ZYRTEC) 10 MG tablet Take 10 mg by mouth 2 (two) times daily.    . Cholecalciferol (VITAMIN D3) 5000 units TABS Take 1 tablet by mouth daily.    . cyclobenzaprine (FLEXERIL) 10 MG tablet Take 1 tablet (10 mg total) by mouth 3 (three) times daily as needed for muscle spasms. 60 tablet 1  . diltiazem (CARDIZEM CD) 180 MG 24 hr capsule TAKE ONE CAPSULE BY MOUTH EVERY DAY. 90 capsule 3  . lisinopril (PRINIVIL,ZESTRIL) 10 MG tablet Take 1 tablet (10 mg total) by mouth daily. 90 tablet 1  . HYDROcodone-acetaminophen (NORCO/VICODIN) 5-325 MG tablet Take 1 tablet by mouth every 6 (six) hours as needed. (Patient not taking: Reported on 02/15/2016) 30 tablet 0  . predniSONE (DELTASONE) 20 MG tablet Take 3 daily for 2 days, then 2 daily for 2 days, then  1 daily for 2 days. (Patient not taking: Reported on 02/15/2016) 12 tablet 0   No facility-administered medications prior to visit.     Allergies:  Allergies  Allergen Reactions  . Gabapentin     Dizziness  . Loratadine Rash    Rash and hives   . Penicillins Hives and Rash    Social History   Social History  . Marital status: Single    Spouse name: N/A  . Number of children: 0  . Years of education: N/A   Occupational History  . Radiographer, therapeutic    Social History Main Topics  . Smoking status: Never Smoker  . Smokeless tobacco: Not on file  . Alcohol use No  . Drug use: No  . Sexual activity: Not on file   Other Topics Concern  . Not on file   Social History Narrative  . No narrative on  file    Family History  Problem Relation Age of Onset  . Stroke Father   . Diabetes Other   . Hyperlipidemia Other   . Heart disease Other   . Hypertension Other   . Cancer Other      Review of Systems:  See HPI for pertinent ROS. All other ROS negative.    Physical Exam: Blood pressure 138/64, pulse 66, temperature 97.9 F (36.6 C), temperature source Oral, resp. rate 16, weight 261 lb (118.4 kg), SpO2 98 %., Body mass index is 50.97 kg/m. General: Obese WF. Appears in no acute distress. Neck: Supple. No thyromegaly. No lymphadenopathy. Lungs: Clear bilaterally to auscultation without wheezes, rales, or rhonchi. Breathing is unlabored. Heart: RRR with S1 S2. No murmurs, rubs, or gallops. Musculoskeletal:  Strength and tone normal for age. Extremities/Skin: Warm and dry. Neuro: Alert and oriented X 3. Moves all extremities spontaneously. Gait is normal. CNII-XII grossly in tact. Psych:  Responds to questions appropriately with a normal affect.     ASSESSMENT AND PLAN:  55 y.o. year old female with  1. Essential hypertension Blood Pressure is at goal. Continue current medications. Check lab to monitor. - BASIC METABOLIC PANEL WITH GFR  2. Vitamin D deficiency Vitamin D level was low at last lab. Vitamin D supplements started at that time. Recheck vitamin D level now. - VITAMIN D 25 Hydroxy (Vit-D Deficiency, Fractures)   3. Morbid obesity-BMI 52 He is well aware of need for weight loss. She has done the whole program to prepare for bariatric surgery in the past but then the surgery was denied by her insurance. To do more exercise than she currently is doing secondary to her back pain. 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  Chronic right-sided low back pain without sciatica This is stable. See history of present illness for details.  Routine visit in 6 months. Sooner if needed.  Signed, [address] [place], [place],  [facility] 02/15/2016 1:48 PM

## 2016-02-16 ENCOUNTER — Other Ambulatory Visit: Payer: Self-pay | Admitting: Physician Assistant

## 2016-02-16 DIAGNOSIS — I10 Essential (primary) hypertension: Secondary | ICD-10-CM

## 2016-02-16 LAB — VITAMIN D 25 HYDROXY (VIT D DEFICIENCY, FRACTURES): VIT D 25 HYDROXY: 48 ng/mL (ref 30–100)

## 2016-02-16 MED ORDER — CHOLECALCIFEROL 50 MCG (2000 UT) PO TABS: 1.0000 | ORAL_TABLET | Freq: Every day | ORAL | 3 refills | Status: DC

## 2016-02-16 NOTE — Telephone Encounter (Signed)
Rx refilled per protocol 

## 2016-02-16 NOTE — Addendum Note (Signed)
Addended by: Phineas SemenJOHNSON, Caleel Kiner A on: 02/16/2016 10:43 AM   Modules accepted: Orders

## 2016-02-23 ENCOUNTER — Other Ambulatory Visit: Payer: Self-pay | Admitting: Cardiology

## 2016-05-07 ENCOUNTER — Encounter: Payer: Self-pay | Admitting: Physician Assistant

## 2016-05-07 ENCOUNTER — Ambulatory Visit (INDEPENDENT_AMBULATORY_CARE_PROVIDER_SITE_OTHER): Payer: 59 | Admitting: Physician Assistant

## 2016-05-07 VITALS — BP 150/86 | HR 79 | Temp 97.5°F | Resp 16 | Wt 259.0 lb

## 2016-05-07 DIAGNOSIS — H8110 Benign paroxysmal vertigo, unspecified ear: Secondary | ICD-10-CM

## 2016-05-07 MED ORDER — MECLIZINE HCL 25 MG PO TABS: 25.0000 mg | ORAL_TABLET | Freq: Three times a day (TID) | ORAL | 0 refills | Status: DC | PRN

## 2016-05-07 NOTE — Progress Notes (Signed)
Patient ID: [name] [name] MRN: [id number], DOB: 19-Apr-1961, 56 y.o. Date of Encounter: @DATE @  Chief Complaint:  Chief Complaint  Patient presents with  . Dizziness    x1w,last 30 min to an hour    HPI: 56 y.o. year old female  presents with above.   Says that she has had 4 episodes--- at the time of these episodes says that "it is like the wall is moving", "it is like walking on a bouncy house". When I ask if it is like walking on a boat or feeling drunken staggery-- she says "yes that's exactly the way it feels" Says that she has had no spinning sensation. Has had no presyncope or syncope.  Says the first episode occurred this past Tuesday. At that time she had been at the grocery store and then came home and was carrying a 24 pack water to the garage. When doing so, felt that staggery sensation.  Then on Thursday she had 2 episodes. The first one she was walking down the hall to the cafeteria at work. Second episode occurred at about 1 AM she woke up and then was going to the bathroom and "looked like the wall was moving further away from me felt staggery"  The last episode occurred Friday at about 4 AM. Says on that day she had to be at work at 5 AM. She had gotten up and showered and went to the kitchen and then again felt that staggery sensation and "looked like the wall was moving."  She states that she has had no viral URI symptoms in the past week or 2. She states that she has noticed no other neurologic changes. No weakness in any extremity no slurred speech. She has felt no palpitations or increased shortness of breath.   Past Medical History:  Diagnosis Date  . Anxiety 2008   seen in ED /w chest pain but told that she was having anxiety  . Arthritis   . Chronic kidney disease 1999   passed kidney stones spontaneously  . History of stress test 2010   done due to chest pain /w Dr. Jacinto Halim. Pt. told that it was wnl.  . Hives of unknown origin    told by PCP Terri Piedra)  to use Zrytec BID to control hives   . Hypertension   . Neuromuscular disorder (HCC)    bacl pain  . Sleep difficulties    has had a sleep study - t old that she didn't meet criteria for machine   . Syncope      Home Meds: Outpatient Medications Prior to Visit  Medication Sig Dispense Refill  . cetirizine (ZYRTEC) 10 MG tablet Take 10 mg by mouth 2 (two) times daily.    . Cholecalciferol 2000 units TABS Take 1 tablet (2,000 Units total) by mouth daily. 30 each 3  . diltiazem (CARDIZEM CD) 180 MG 24 hr capsule TAKE ONE CAPSULE BY MOUTH EVERY DAY. 90 capsule 3  . lisinopril (PRINIVIL,ZESTRIL) 10 MG tablet TAKE 1 TABLET BY MOUTH EVERY DAY 90 tablet 1  . cyclobenzaprine (FLEXERIL) 10 MG tablet Take 1 tablet (10 mg total) by mouth 3 (three) times daily as needed for muscle spasms. (Patient not taking: Reported on 05/07/2016) 60 tablet 1  . HYDROcodone-acetaminophen (NORCO/VICODIN) 5-325 MG tablet Take 1 tablet by mouth every 6 (six) hours as needed. (Patient not taking: Reported on 05/07/2016) 30 tablet 0  . predniSONE (DELTASONE) 20 MG tablet Take 3 daily for 2 days, then  2 daily for 2 days, then 1 daily for 2 days. (Patient not taking: Reported on 05/07/2016) 12 tablet 0   No facility-administered medications prior to visit.     Allergies:  Allergies  Allergen Reactions  . Gabapentin     Dizziness  . Loratadine Rash    Rash and hives   . Penicillins Hives and Rash    Social History   Social History  . Marital status: Single    Spouse name: N/A  . Number of children: 0  . Years of education: N/A   Occupational History  . Radiographer, therapeuticmanufacturing technician    Social History Main Topics  . Smoking status: Never Smoker  . Smokeless tobacco: Not on file  . Alcohol use No  . Drug use: No  . Sexual activity: Not on file   Other Topics Concern  . Not on file   Social History Narrative  . No narrative on file    Family History  Problem Relation Age of Onset  . Stroke Father   .  Diabetes Other   . Hyperlipidemia Other   . Heart disease Other   . Hypertension Other   . Cancer Other      Review of Systems:  See HPI for pertinent ROS. All other ROS negative.    Physical Exam: Blood pressure (!) 150/86, pulse 79, temperature 97.5 F (36.4 C), temperature source Oral, resp. rate 16, weight 259 lb (117.5 kg), SpO2 98 %., Body mass index is 50.58 kg/m. General: Obese WF. Appears in no acute distress. Head: Normocephalic, atraumatic, eyes without discharge, sclera non-icteric, nares are without discharge. Bilateral auditory canals clear, TM's are without perforation, pearly grey and translucent with reflective cone of light bilaterally.  Neck: Supple. No thyromegaly. No lymphadenopathy. Lungs: Clear bilaterally to auscultation without wheezes, rales, or rhonchi. Breathing is unlabored. Heart: RRR with S1 S2. No murmurs, rubs, or gallops. Musculoskeletal:  Strength and tone normal for age. [place] Maneuver---does not reproduce symptoms Extremities/Skin: Warm and dry. Neuro: Alert and oriented X 3. Moves all extremities spontaneously. Gait is normal. CNII-XII grossly in tact. Romberg normal. Psych:  Responds to questions appropriately with a normal affect.     ASSESSMENT AND PLAN:  56 y.o. year old female with  1. Benign paroxysmal positional vertigo, unspecified laterality Symptoms consistent with positional vertigo. Told her that if symptoms recur she can use the meclizine as needed. If symptoms persist beyond one more week then follow-up for further evaluation. - meclizine (ANTIVERT) 25 MG tablet; Take 1 tablet (25 mg total) by mouth 3 (three) times daily as needed for dizziness.  Dispense: 30 tablet; Refill: 0   Signed, [address] [place]ixon, [place], [facility] 05/07/2016 4:50 PM

## 2016-08-15 ENCOUNTER — Ambulatory Visit: Payer: 59 | Admitting: Physician Assistant

## 2016-08-16 ENCOUNTER — Ambulatory Visit: Payer: 59 | Admitting: Physician Assistant

## 2016-08-20 ENCOUNTER — Encounter: Payer: Self-pay | Admitting: Physician Assistant

## 2016-08-20 ENCOUNTER — Ambulatory Visit (INDEPENDENT_AMBULATORY_CARE_PROVIDER_SITE_OTHER): Payer: 59 | Admitting: Physician Assistant

## 2016-08-20 VITALS — BP 130/84 | HR 63 | Temp 97.7°F | Resp 16 | Wt 258.2 lb

## 2016-08-20 DIAGNOSIS — G8929 Other chronic pain: Secondary | ICD-10-CM | POA: Diagnosis not present

## 2016-08-20 DIAGNOSIS — I10 Essential (primary) hypertension: Secondary | ICD-10-CM | POA: Diagnosis not present

## 2016-08-20 DIAGNOSIS — E559 Vitamin D deficiency, unspecified: Secondary | ICD-10-CM

## 2016-08-20 DIAGNOSIS — M5440 Lumbago with sciatica, unspecified side: Secondary | ICD-10-CM | POA: Diagnosis not present

## 2016-08-20 LAB — BASIC METABOLIC PANEL WITH GFR
BUN: 16 mg/dL (ref 7–25)
CO2: 22 mmol/L (ref 20–31)
Calcium: 8.9 mg/dL (ref 8.6–10.4)
Chloride: 105 mmol/L (ref 98–110)
Creat: 0.67 mg/dL (ref 0.50–1.05)
GFR, Est African American: >89 mL/min (ref >=60)
GFR, Est Non African American: >89 mL/min (ref >=60)
GLUCOSE: 102 mg/dL — AB (ref 70–99)
Potassium: 4.4 mmol/L (ref 3.5–5.3)
Sodium: 140 mmol/L (ref 135–146)

## 2016-08-20 NOTE — Progress Notes (Signed)
Patient ID: [name] [name] MRN: [id number], DOB: 14-Feb-1961, 56 y.o. Date of Encounter: @  Chief Complaint:  Chief Complaint  Patient presents with  . F/U Hypertension    f/u    HPI: 56 y.o. year old female  presents Routine follow-up visit to follow-up her hypertension, vitamin D deficiency and back pain.  Vitamin D deficiency ----At last lab check her vitamin D level had increased from 14 to 48 -- so at that time decreased dose of vitamin D supplements. Needs to recheck that lab.  Hypertension ----She is taking blood pressure medications as directed. No lightheadedness. No other adverse effects.  Low back pain---- I reviewed her office notes here from 11/07/15 and 11/11/15. She reports that after those visits she did have follow-up with Dr. Shon Baton.  Had surgery to her low back by Dr. Shon Baton July 2015.  Says that when she saw him for follow-up recently, he did not add any new treatment.  He said that that bariatric surgery would be the only thing that would help.  Says that he does not believe in pain medicines. Says that he has told her that this is just something to live with.  Says that in the past prior to the back surgery she went through the whole bariatric weight loss program in preparation for bariatric surgery.  Says that she saw all the different specialists, did all the things necessary but then at the last minute [employer] said that they would not pay for bariatric surgery.  At the last minute she ended up having to change the plan and have back surgery instead.  Says that she does not want to go through that whole process again and then find out that the bariatric surgery is denied.  Has a "health coach " through work. Back pain is stable.  She states her back doesn't hurt much. Her legs feel achy and her knees hurt.   Says that at work, she is being assigned to a different group for her "health coaching"  No other complaints or concerns today.   Past  Medical History:  Diagnosis Date  . Anxiety 2008   seen in ED /w chest pain but told that she was having anxiety  . Arthritis   . Chronic kidney disease 1999   passed kidney stones spontaneously  . History of stress test 2010   done due to chest pain /w Dr. Jacinto Halim. Pt. told that it was wnl.  . Hives of unknown origin    told by PCP Terri Piedra) to use Zrytec BID to control hives   . Hypertension   . Neuromuscular disorder (HCC)    bacl pain  . Sleep difficulties    has had a sleep study - t old that she didn't meet criteria for machine   . Syncope      Home Meds: Outpatient Medications Prior to Visit  Medication Sig Dispense Refill  . cetirizine (ZYRTEC) 10 MG tablet Take 10 mg by mouth 2 (two) times daily.    . Cholecalciferol 2000 units TABS Take 1 tablet (2,000 Units total) by mouth daily. 30 each 3  . diltiazem (CARDIZEM CD) 180 MG 24 hr capsule TAKE ONE CAPSULE BY MOUTH EVERY DAY. 90 capsule 3  . lisinopril (PRINIVIL,ZESTRIL) 10 MG tablet TAKE 1 TABLET BY MOUTH EVERY DAY 90 tablet 1  . meclizine (ANTIVERT) 25 MG tablet Take 1 tablet (25 mg total) by mouth 3 (three) times daily as needed for dizziness. 30 tablet 0  .  cyclobenzaprine (FLEXERIL) 10 MG tablet Take 1 tablet (10 mg total) by mouth 3 (three) times daily as needed for muscle spasms. (Patient not taking: Reported on 05/07/2016) 60 tablet 1  . HYDROcodone-acetaminophen (NORCO/VICODIN) 5-325 MG tablet Take 1 tablet by mouth every 6 (six) hours as needed. (Patient not taking: Reported on 05/07/2016) 30 tablet 0  . predniSONE (DELTASONE) 20 MG tablet Take 3 daily for 2 days, then 2 daily for 2 days, then 1 daily for 2 days. (Patient not taking: Reported on 05/07/2016) 12 tablet 0   No facility-administered medications prior to visit.     Allergies:  Allergies  Allergen Reactions  . Gabapentin     Dizziness  . Loratadine Rash    Rash and hives   . Penicillins Hives and Rash    Social History   Social History  . Marital  status: Single    Spouse name: N/A  . Number of children: 0  . Years of education: N/A   Occupational History  . Radiographer, therapeutic    Social History Main Topics  . Smoking status: Never Smoker  . Smokeless tobacco: Not on file  . Alcohol use No  . Drug use: No  . Sexual activity: Not on file   Other Topics Concern  . Not on file   Social History Narrative  . No narrative on file    Family History  Problem Relation Age of Onset  . Stroke Father   . Diabetes Other   . Hyperlipidemia Other   . Heart disease Other   . Hypertension Other   . Cancer Other      Review of Systems:  See HPI for pertinent ROS. All other ROS negative.    Physical Exam: Blood pressure 130/84, pulse 63, temperature 97.7 F (36.5 C), temperature source Oral, resp. rate 16, weight 258 lb 3.2 oz (117.1 kg), SpO2 97 %., There is no height or weight on file to calculate BMI. General: Obese WF. Appears in no acute distress. Neck: Supple. No thyromegaly. No lymphadenopathy. Lungs: Clear bilaterally to auscultation without wheezes, rales, or rhonchi. Breathing is unlabored. Heart: RRR with S1 S2. No murmurs, rubs, or gallops. Musculoskeletal:  Strength and tone normal for age. Extremities/Skin: Warm and dry. Neuro: Alert and oriented X 3. Moves all extremities spontaneously. Gait is normal. CNII-XII grossly in tact. Psych:  Responds to questions appropriately with a normal affect.     ASSESSMENT AND PLAN:  56 y.o. year old female with  1. Essential hypertension Blood Pressure is at goal. Continue current medications. Check lab to monitor. - BASIC METABOLIC PANEL WITH GFR  2. Vitamin D deficiency At past lab, Vitamin D level was low -- Vitamin D supplements started at that time---at last lab, Vit D level had increased significantly so Vit D dose decreased. Recheck vitamin D level now. - VITAMIN D 25 Hydroxy (Vit-D Deficiency, Fractures)   3. Morbid obesity-BMI 52 She is well aware of  need for weight loss. She has done the whole program to prepare for bariatric surgery in the past but then the surgery was denied by her insurance.  She can not do more exercise than she currently is doing secondary to her back pain, leg pain.  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  Chronic right-sided low back pain without sciatica This is stable. See history of present illness for details.  OSA Noted that chart includes OSA---she says she had the  test "but it wasn't bad enough to require CPAP"  Her last CPE was 2016----at next OV will discuss scheduling CPE  Routine visit in 6 months. F/U sooner if needed.  Signed, [address] [place], [place], [facility] 08/20/2016 8:55 AM

## 2016-08-21 LAB — VITAMIN D 25 HYDROXY (VIT D DEFICIENCY, FRACTURES): Vit D, 25-Hydroxy: 42 ng/mL (ref 30–100)

## 2016-12-03 ENCOUNTER — Ambulatory Visit (INDEPENDENT_AMBULATORY_CARE_PROVIDER_SITE_OTHER): Payer: 59 | Admitting: Physician Assistant

## 2016-12-03 ENCOUNTER — Telehealth: Payer: Self-pay | Admitting: Cardiology

## 2016-12-03 ENCOUNTER — Encounter: Payer: Self-pay | Admitting: Physician Assistant

## 2016-12-03 VITALS — BP 136/72 | HR 65 | Temp 97.9°F | Resp 16 | Wt 257.4 lb

## 2016-12-03 DIAGNOSIS — R5381 Other malaise: Secondary | ICD-10-CM

## 2016-12-03 DIAGNOSIS — R519 Headache, unspecified: Secondary | ICD-10-CM

## 2016-12-03 DIAGNOSIS — R5383 Other fatigue: Secondary | ICD-10-CM

## 2016-12-03 DIAGNOSIS — R51 Headache: Secondary | ICD-10-CM | POA: Diagnosis not present

## 2016-12-03 DIAGNOSIS — R001 Bradycardia, unspecified: Secondary | ICD-10-CM

## 2016-12-03 DIAGNOSIS — M791 Myalgia, unspecified site: Secondary | ICD-10-CM

## 2016-12-03 MED ORDER — DILTIAZEM HCL ER COATED BEADS 120 MG PO CP24: 120.0000 mg | ORAL_CAPSULE | Freq: Every day | ORAL | 5 refills | Status: DC

## 2016-12-03 MED ORDER — DOXYCYCLINE HYCLATE 100 MG PO TABS: 100.0000 mg | ORAL_TABLET | Freq: Two times a day (BID) | ORAL | 0 refills | Status: DC

## 2016-12-03 NOTE — Progress Notes (Signed)
Patient ID: [name] HakeConnie S Reader MRN: [id number], DOB: 10/17/1960, 56 y.o. Date of Encounter: @DATE @  Chief Complaint:  Chief Complaint  Patient presents with  . Headache      . Fatigue  . Dizziness    HPI: 56 y.o. year old female  presents with above.   She states that "Since last Tuesday, ( 6 days ago) I have felt terrible." Reports that "Last Tuesday, when went to Dentist, they said HR was down and BP was up.Mentioned Bradycardia" Thursday left work --- "Felt like hit by a truck, really tired. Headache--pounding. In bed all day today and over the weekend." "Feel like I have the flu but no flu" At home checked--HR-60, BP-"ok" Reports that h/a not localized -- entire head--"pounding" Reports that she usually never has headaches. --- rarely. Says " I feel like I'm in a straight jacket but then realize I acan move"--says body aches, stiff all over, feels like hit by a truck, no energy. Dentist--?Bradycardia. She called her Cardiologist but they told her to have it checked at PCP first.  She has had no known tic bites.  She has seen no rash.    Past Medical History:  Diagnosis Date  . Anxiety 2008   seen in ED /w chest pain but told that she was having anxiety  . Arthritis   . Chronic kidney disease 1999   passed kidney stones spontaneously  . History of stress test 2010   done due to chest pain /w Dr. Jacinto HalimGanji. Pt. told that it was wnl.  . Hives of unknown origin    told by PCP Terri Piedra( [place]) to use Zrytec BID to control hives   . Hypertension   . Neuromuscular disorder (HCC)    bacl pain  . Sleep difficulties    has had a sleep study - t old that she didn't meet criteria for machine   . Syncope      Home Meds: Outpatient Medications Prior to Visit  Medication Sig Dispense Refill  . cetirizine (ZYRTEC) 10 MG tablet Take 10 mg by mouth 2 (two) times daily.    . Cholecalciferol 2000 units TABS Take 1 tablet (2,000 Units total) by mouth daily. 30 each 3  . diltiazem (CARDIZEM CD)  180 MG 24 hr capsule TAKE ONE CAPSULE BY MOUTH EVERY DAY. 90 capsule 3  . lisinopril (PRINIVIL,ZESTRIL) 10 MG tablet TAKE 1 TABLET BY MOUTH EVERY DAY 90 tablet 1  . meclizine (ANTIVERT) 25 MG tablet Take 1 tablet (25 mg total) by mouth 3 (three) times daily as needed for dizziness. 30 tablet 0   No facility-administered medications prior to visit.     Allergies:  Allergies  Allergen Reactions  . Gabapentin     Dizziness  . Loratadine Rash    Rash and hives   . Penicillins Hives and Rash    Social History   Social History  . Marital status: Single    Spouse name: N/A  . Number of children: 0  . Years of education: N/A   Occupational History  . Radiographer, therapeuticmanufacturing technician    Social History Main Topics  . Smoking status: Never Smoker  . Smokeless tobacco: Never Used  . Alcohol use No  . Drug use: No  . Sexual activity: Not on file   Other Topics Concern  . Not on file   Social History Narrative  . No narrative on file    Family History  Problem Relation Age of Onset  . Stroke Father   .  Diabetes Other   . Hyperlipidemia Other   . Heart disease Other   . Hypertension Other   . Cancer Other      Review of Systems:  See HPI for pertinent ROS. All other ROS negative.    Physical Exam: Blood pressure 136/72, pulse 65, temperature 97.9 F (36.6 C), temperature source Oral, resp. rate 16, weight 257 lb 6.4 oz (116.8 kg), SpO2 97 %., Body mass index is 50.27 kg/m. General: obese WF. Appears in no acute distress. Neck: Supple. No thyromegaly. No lymphadenopathy. Lungs: Clear bilaterally to auscultation without wheezes, rales, or rhonchi. Breathing is unlabored. Heart: RRR with S1 S2. No murmurs, rubs, or gallops. Abdomen: Soft, non-tender, non-distended with normoactive bowel sounds. No hepatomegaly. No rebound/guarding. No obvious abdominal masses. Musculoskeletal:  Strength and tone normal for age. Extremities/Skin: Warm and dry. No edema. No rashes or suspicious  lesions. Neuro: Alert and oriented X 3. Moves all extremities spontaneously. Gait is normal. CNII-XII grossly in tact. Psych:  Responds to questions appropriately with a normal affect.   EKG---Sinus Bradycardia 57 bpm. NonSpecific ST-T changes.   ASSESSMENT AND PLAN:  56 y.o. year old female with  1. Throbbing headache Will obtain labs. Will go ahead and start Doxy to cover possible Lyme or RMSF.Told to her avoid sun exposure while on med. Will f/u lab results.  Note given for oow work through Monday 8/13 --- she will return for f/u OV wth me that day.  If symptoms worsen in interim, f/u sooner.  - CBC with Differential/Platelet - COMPLETE METABOLIC PANEL WITH GFR - TSH - Mononucleosis screen - B. burgdorfi antibodies by WB - Rocky mtn spotted fvr abs pnl(IgG+IgM) - doxycycline (VIBRA-TABS) 100 MG tablet; Take 1 tablet (100 mg total) by mouth 2 (two) times daily.  Dispense: 28 tablet; Refill: 0  2. Malaise and fatigue Will obtain labs. Will go ahead and start Doxy to cover possible Lyme or RMSF.Told to her avoid sun exposure while on med. Will f/u lab results.  Note given for oow work through Monday 8/13 --- she will return for f/u OV wth me that day.  If symptoms worsen in interim, f/u sooner.  - CBC with Differential/Platelet - COMPLETE METABOLIC PANEL WITH GFR - TSH - Mononucleosis screen - B. burgdorfi antibodies by WB - Rocky mtn spotted fvr abs pnl(IgG+IgM) - doxycycline (VIBRA-TABS) 100 MG tablet; Take 1 tablet (100 mg total) by mouth 2 (two) times daily.  Dispense: 28 tablet; Refill: 0  3. Myalgia Will obtain labs. Will go ahead and start Doxy to cover possible Lyme or RMSF.Told to her avoid sun exposure while on med. Will f/u lab results.  Note given for oow work through Monday 8/13 --- she will return for f/u OV wth me that day.  If symptoms worsen in interim, f/u sooner.  - CBC with Differential/Platelet - COMPLETE METABOLIC PANEL WITH GFR - TSH - Mononucleosis  screen - B. burgdorfi antibodies by WB - Rocky mtn spotted fvr abs pnl(IgG+IgM) - doxycycline (VIBRA-TABS) 100 MG tablet; Take 1 tablet (100 mg total) by mouth 2 (two) times daily.  Dispense: 28 tablet; Refill: 0  4. Bradycardia She is on Diltiazem 180mg . She says this was started by her prior PCP for BP (not on for AFib etc) so will decrease dose to 120mg . --Decrease to 120mg  --Recheck HR, BP at f/u OV one week.  Note given for oow through Mon 8/13. F/U OV Mon 8/13.   [address] [place], [place], [facility] 12/03/2016 4:38 PM

## 2016-12-03 NOTE — Telephone Encounter (Signed)
Returned call to patient d/t dizziness, headache, bradycardia, HTN   Patient reports she went to dentist and they couldn't get a pulse but then it got up to about 40bpm. Dentist recommended that she see her MD. She reports symptoms for fatigue, headache, high BP for a few months. She states she has to pace herself at work as she feels like she will pass out on occasion. She has seen Dr. Caralee AtesJordan/Dr. Klein for syncope in the past - she has worn a heart monitor in the past but she states nothing showed up. She states her BP has been running a little high than normal - 160s. She is monitoring her BP every day. Verified medications w/patient.  VS today: 149 systolic - HR 62 HR has been in the 40s and 50s at home.   Patient scheduled to see PCP today for same complaints.  Advised would defer to Dr. [place] for advice - she has not been seen since 05/2015

## 2016-12-03 NOTE — Telephone Encounter (Signed)
[name] [name] is calling because her pulse rate is dropping and she is feeling very dizzy and tired and head is hurting , Blood pressure is on the high end even for her . Please call .Marland Kitchen. Tha nks

## 2016-12-03 NOTE — Telephone Encounter (Signed)
[name] [name] is calling because he pulse rate is dropping , She is feeling very dizzy, tired and head is hurting . Blood Pressure is on the high end even for her . Please call

## 2016-12-03 NOTE — Telephone Encounter (Signed)
She should start with primary care evaluation.   Kendrea Cerritos Swazil[place]Jordan MD, [facility]

## 2016-12-03 NOTE — Telephone Encounter (Signed)
Returned call to patient Dr.Jordan's recommendations given. 

## 2016-12-04 LAB — COMPLETE METABOLIC PANEL WITH GFR
ALT: 17 U/L (ref 6–29)
AST: 17 U/L (ref 10–35)
Albumin: 3.8 g/dL (ref 3.6–5.1)
Alkaline Phosphatase: 100 U/L (ref 33–130)
BUN: 10 mg/dL (ref 7–25)
CALCIUM: 8.6 mg/dL (ref 8.6–10.4)
CHLORIDE: 107 mmol/L (ref 98–110)
CO2: 27 mmol/L (ref 20–32)
Creat: 0.68 mg/dL (ref 0.50–1.05)
Glucose, Bld: 79 mg/dL (ref 70–99)
POTASSIUM: 4.1 mmol/L (ref 3.5–5.3)
Sodium: 141 mmol/L (ref 135–146)
Total Bilirubin: 0.5 mg/dL (ref 0.2–1.2)
Total Protein: 6.1 g/dL (ref 6.1–8.1)

## 2016-12-04 LAB — CBC WITH DIFFERENTIAL/PLATELET
Basophils Absolute: 0 cells/uL (ref 0–200)
Basophils Relative: 0 %
EOS ABS: 415 {cells}/uL (ref 15–500)
Eosinophils Relative: 5 %
HCT: 44.8 % (ref 35.0–45.0)
Hemoglobin: 14.9 g/dL (ref 12.0–15.0)
LYMPHS PCT: 25 %
Lymphs Abs: 2075 cells/uL (ref 850–3900)
MCH: 30.7 pg (ref 27.0–33.0)
MCHC: 33.3 g/dL (ref 32.0–36.0)
MCV: 92.4 fL (ref 80.0–100.0)
MONO ABS: 664 {cells}/uL (ref 200–950)
MPV: 10.9 fL (ref 7.5–12.5)
Monocytes Relative: 8 %
NEUTROS PCT: 62 %
Neutro Abs: 5146 cells/uL (ref 1500–7800)
PLATELETS: 278 10*3/uL (ref 140–400)
RBC: 4.85 MIL/uL (ref 3.80–5.10)
RDW: 13.2 % (ref 11.0–15.0)
WBC: 8.3 10*3/uL (ref 3.8–10.8)

## 2016-12-04 LAB — LYME ABY, WSTRN BLT IGG & IGM W/BANDS
B BURGDORFERI IGG ABS (IB): NEGATIVE
B burgdorferi IgM Abs (IB): NEGATIVE
LYME DISEASE 23 KD IGG: NONREACTIVE
LYME DISEASE 23 KD IGM: NONREACTIVE
LYME DISEASE 28 KD IGG: NONREACTIVE
LYME DISEASE 39 KD IGG: NONREACTIVE
LYME DISEASE 41 KD IGG: NONREACTIVE
LYME DISEASE 45 KD IGG: NONREACTIVE
LYME DISEASE 66 KD IGG: NONREACTIVE
LYME DISEASE 93 KD IGG: NONREACTIVE
Lyme Disease 18 kD IgG: NONREACTIVE
Lyme Disease 30 kD IgG: REACTIVE — AB
Lyme Disease 39 kD IgM: NONREACTIVE
Lyme Disease 41 kD IgM: NONREACTIVE
Lyme Disease 58 kD IgG: NONREACTIVE

## 2016-12-04 LAB — TSH: TSH: 1.44 mIU/L

## 2016-12-04 LAB — ROCKY MTN SPOTTED FVR ABS PNL(IGG+IGM)
RMSF IGG: NOT DETECTED
RMSF IgM: NOT DETECTED

## 2016-12-04 LAB — MONONUCLEOSIS SCREEN: HETEROPHILE, MONO SCREEN: NEGATIVE

## 2016-12-10 ENCOUNTER — Encounter: Payer: Self-pay | Admitting: Physician Assistant

## 2016-12-10 ENCOUNTER — Ambulatory Visit (INDEPENDENT_AMBULATORY_CARE_PROVIDER_SITE_OTHER): Payer: 59 | Admitting: Physician Assistant

## 2016-12-10 VITALS — BP 140/86 | HR 77 | Resp 16 | Ht 60.0 in | Wt 258.8 lb

## 2016-12-10 DIAGNOSIS — H8110 Benign paroxysmal vertigo, unspecified ear: Secondary | ICD-10-CM | POA: Diagnosis not present

## 2016-12-10 MED ORDER — MECLIZINE HCL 25 MG PO TABS: 25.0000 mg | ORAL_TABLET | Freq: Three times a day (TID) | ORAL | 0 refills | Status: DC | PRN

## 2016-12-10 NOTE — Progress Notes (Signed)
Patient ID: ZENAB GRONEWOLD MRN: [id number], DOB: [birth year], 56 y.o. Date of Encounter: @DATE @  Chief Complaint:  Chief Complaint  Patient presents with  . Headache      . Fatigue  . Dizziness    HPI: 56 y.o. year old female  presents with above.    12/03/2016:  She states that "Since last Tuesday, ( 6 days ago) I have felt terrible." Reports that "Last Tuesday, when went to Dentist, they said HR was down and BP was up.Mentioned Bradycardia" Thursday left work --- "Felt like hit by a truck, really tired. Headache--pounding. In bed all day today and over the weekend." "Feel like I have the flu but no flu" At home checked--HR-60, BP-"ok" Reports that h/a not localized -- entire head--"pounding" Reports that she usually never has headaches. --- rarely. Says " I feel like I'm in a straight jacket but then realize I acan move"--says body aches, stiff all over, feels like hit by a truck, no energy. Dentist--?Bradycardia. She called her Cardiologist but they told her to have it checked at PCP first.  She has had no known tic bites.  She has seen no rash.   EKG---Sinus Bradycardia 57 bpm. NonSpecific ST-T changes.   ASSESSMENT AND PLAN:  56 y.o. year old female with  1. Throbbing headache 2. Malaise and fatigue 3. Myalgia Will obtain labs. Will go ahead and start Doxy to cover possible Lyme or RMSF.Told to her avoid sun exposure while on med. Will f/u lab results.  Note given for oow work through Monday 8/13 --- she will return for f/u OV wth me that day.  If symptoms worsen in interim, f/u sooner.  - CBC with Differential/Platelet - COMPLETE METABOLIC PANEL WITH GFR - TSH - Mononucleosis screen - B. burgdorfi antibodies by WB - Rocky mtn spotted fvr abs pnl(IgG+IgM) - doxycycline (VIBRA-TABS) 100 MG tablet; Take 1 tablet (100 mg total) by mouth 2 (two) times daily.  Dispense: 28 tablet; Refill: 0   4. Bradycardia She is on Diltiazem 180mg . She says this was started by her  prior PCP for BP (not on for AFib etc) so will decrease dose to 120mg . --Decrease to 120mg  --Recheck HR, BP at f/u OV one week.  Note given for oow through Mon 8/13. F/U OV Mon 8/13.    12/10/2016:  Today I reviewed with her that all labs from the last visit all came back normal. Today she reports that she is no longer having any headache. She then says that she has felt a little dizzy--- I then asked if she is actually having any spinning sensation and she then says no no spinning. Asked if she is feeling drunk or as if she is on a boat. She says yes that is exactly how I feel. He'll's like she has been drugs or rhonchi. Says that she is still feeling tired and worn out. Says that she really hasn't even done anything over the past week or so. Says that just now coming into our office just getting out of her car and on the sidewalk symptoms hit ". I then asked if it seems that the symptoms are curled with position change. Asked if the symptoms worsen when she rolls over in bed or has position change and she says they do. Visit she had not even noticed that but now that she thinks about it whenever she has a quick position changes when the symptoms really occur. Asked if she had had any viral respiratory symptoms  in the last month but she says she has not. She had had no sneezing no drainage no cough. Says that if she sits still the symptoms pass. But if she moves the worsen.   Past Medical History:  Diagnosis Date  . Anxiety 2008   seen in ED /w chest pain but told that she was having anxiety  . Arthritis   . Chronic kidney disease 1999   passed kidney stones spontaneously  . History of stress test 2010   done due to chest pain /w Dr. Jacinto HalimGanji. Pt. told that it was wnl.  . Hives of unknown origin    told by PCP Terri Piedra( [place]) to use Zrytec BID to control hives   . Hypertension   . Neuromuscular disorder (HCC)    bacl pain  . Sleep difficulties    has had a sleep study - t old that she  didn't meet criteria for machine   . Syncope      Home Meds: Outpatient Medications Prior to Visit  Medication Sig Dispense Refill  . cetirizine (ZYRTEC) 10 MG tablet Take 10 mg by mouth 2 (two) times daily.    . Cholecalciferol 2000 units TABS Take 1 tablet (2,000 Units total) by mouth daily. 30 each 3  . diltiazem (CARDIZEM CD) 120 MG 24 hr capsule Take 1 capsule (120 mg total) by mouth daily. 30 capsule 5  . doxycycline (VIBRA-TABS) 100 MG tablet Take 1 tablet (100 mg total) by mouth 2 (two) times daily. 28 tablet 0  . lisinopril (PRINIVIL,ZESTRIL) 10 MG tablet TAKE 1 TABLET BY MOUTH EVERY DAY 90 tablet 1  . meclizine (ANTIVERT) 25 MG tablet Take 1 tablet (25 mg total) by mouth 3 (three) times daily as needed for dizziness. 30 tablet 0   No facility-administered medications prior to visit.     Allergies:  Allergies  Allergen Reactions  . Gabapentin     Dizziness  . Loratadine Rash    Rash and hives   . Penicillins Hives and Rash    Social History   Social History  . Marital status: Single    Spouse name: N/A  . Number of children: 0  . Years of education: N/A   Occupational History  . Radiographer, therapeuticmanufacturing technician    Social History Main Topics  . Smoking status: Never Smoker  . Smokeless tobacco: Never Used  . Alcohol use No  . Drug use: No  . Sexual activity: Not on file   Other Topics Concern  . Not on file   Social History Narrative  . No narrative on file    Family History  Problem Relation Age of Onset  . Stroke Father   . Diabetes Other   . Hyperlipidemia Other   . Heart disease Other   . Hypertension Other   . Cancer Other      Review of Systems:  See HPI for pertinent ROS. All other ROS negative.    Physical Exam: Blood pressure 140/86, pulse 77, resp. rate 16, height 5' (1.524 m), weight 258 lb 12.8 oz (117.4 kg), SpO2 98 %., Body mass index is 50.54 kg/m. General: obese WF. Appears in no acute distress. HEENT: Left ear canal and tympanic  membrane appear normal. Right ear canal is occluded with cerumen. We are irrigating this today. Her pharynx appears normal. Nares appears normal. Dix-Hallpike maneuver is positive. Does reproduce her symptoms. Neck: Supple. No thyromegaly. No lymphadenopathy. Lungs: Clear bilaterally to auscultation without wheezes, rales, or rhonchi. Breathing is unlabored. Heart:  RRR with S1 S2. No murmurs, rubs, or gallops. Musculoskeletal:  Strength and tone normal for age. Extremities/Skin: Warm and dry. No edema. No rashes or suspicious lesions. Neuro: Alert and oriented X 3. Moves all extremities spontaneously. Gait is normal. CNII-XII grossly in tact. Psych:  Responds to questions appropriately with a normal affect.     ASSESSMENT AND PLAN:  56 y.o. year old female with   1. Benign paroxysmal positional vertigo, unspecified laterality I went to put in order for meclizine and saw that it was on it already on her medicine list and asked about this. She said that I prescribed this when she was having dizziness back in April. Says that she has not been using that recently for the symptoms as she did not realize that this was that same problem. In April she was having different symptoms. I have told her to use the meclizine every 4 hours and then use it as needed. I will also go ahead and refer her for follow-up with ENT. Staff is irrigating her right ear today to get this cleared. She will return for a visit with me on Wednesday 12/12/2016 to see if her symptoms are controlled with the meclizine and to decide on return to work. She states that at work she is at sitting at a desk part of the time then she gets up and goes through the lines to the lab. Says that every hour she gets up and makes around.  - meclizine (ANTIVERT) 25 MG tablet; Take 1 tablet (25 mg total) by mouth 3 (three) times daily as needed for dizziness.  Dispense: 30 tablet; Refill: 0 - Ambulatory referral to ENT     Signed, [place]ier [place], [place], [facility] 12/10/2016 11:59 AM

## 2016-12-12 ENCOUNTER — Encounter: Payer: Self-pay | Admitting: Physician Assistant

## 2016-12-12 ENCOUNTER — Ambulatory Visit (INDEPENDENT_AMBULATORY_CARE_PROVIDER_SITE_OTHER): Payer: 59 | Admitting: Physician Assistant

## 2016-12-12 VITALS — BP 138/86 | HR 67 | Ht 60.0 in | Wt 259.4 lb

## 2016-12-12 DIAGNOSIS — H8110 Benign paroxysmal vertigo, unspecified ear: Secondary | ICD-10-CM | POA: Diagnosis not present

## 2016-12-12 NOTE — Progress Notes (Addendum)
Patient ID: [name] [name] MRN: [id number], DOB: 1960/07/03, 57 y.o. Date of Encounter: @DATE @  Chief Complaint:  Chief Complaint  Patient presents with  . Headache      . Fatigue  . Dizziness    HPI: 56 y.o. year old female  presents with above.    12/03/2016:  She states that "Since last Tuesday, ( 6 days ago) I have felt terrible." Reports that "Last Tuesday, when went to Dentist, they said HR was down and BP was up.Mentioned Bradycardia" Thursday left work --- "Felt like hit by a truck, really tired. Headache--pounding. In bed all day today and over the weekend." "Feel like I have the flu but no flu" At home checked--HR-60, BP-"ok" Reports that h/a not localized -- entire head--"pounding" Reports that she usually never has headaches. --- rarely. Says " I feel like I'm in a straight jacket but then realize I acan move"--says body aches, stiff all over, feels like hit by a truck, no energy. Dentist--?Bradycardia. She called her Cardiologist but they told her to have it checked at PCP first.  She has had no known tic bites.  She has seen no rash.   EKG---Sinus Bradycardia 57 bpm. NonSpecific ST-T changes.   ASSESSMENT AND PLAN:  56 y.o. year old female with  1. Throbbing headache 2. Malaise and fatigue 3. Myalgia Will obtain labs. Will go ahead and start Doxy to cover possible Lyme or RMSF.Told to her avoid sun exposure while on med. Will f/u lab results.  Note given for oow work through Monday 8/13 --- she will return for f/u OV wth me that day.  If symptoms worsen in interim, f/u sooner.  - CBC with Differential/Platelet - COMPLETE METABOLIC PANEL WITH GFR - TSH - Mononucleosis screen - B. burgdorfi antibodies by WB - Rocky mtn spotted fvr abs pnl(IgG+IgM) - doxycycline (VIBRA-TABS) 100 MG tablet; Take 1 tablet (100 mg total) by mouth 2 (two) times daily.  Dispense: 28 tablet; Refill: 0   4. Bradycardia She is on Diltiazem 180mg . She says this was started by her  prior PCP for BP (not on for AFib etc) so will decrease dose to 120mg . --Decrease to 120mg  --Recheck HR, BP at f/u OV one week.  Note given for oow through Mon 8/13. F/U OV Mon 8/13.      12/10/2016:  Today I reviewed with her that all labs from the last visit all came back normal. Today she reports that she is no longer having any headache. She then says that she has felt a little dizzy--- I then asked if she is actually having any spinning sensation and she then says no no spinning. Asked if she is feeling drunk or as if she is on a boat. She says yes that is exactly how I feel. He'll's like she has been drugs or rhonchi. Says that she is still feeling tired and worn out. Says that she really hasn't even done anything over the past week or so. Says that just now coming into our office just getting out of her car and on the sidewalk symptoms hit ". I then asked if it seems that the symptoms are curled with position change. Asked if the symptoms worsen when she rolls over in bed or has position change and she says they do. Visit she had not even noticed that but now that she thinks about it whenever she has a quick position changes when the symptoms really occur. Asked if she had had any viral  respiratory symptoms in the last month but she says she has not. She had had no sneezing no drainage no cough. Says that if she sits still the symptoms pass. But if she moves the worsen.  1. Benign paroxysmal positional vertigo, unspecified laterality I went to put in order for meclizine and saw that it was on it already on her medicine list and asked about this. She said that I prescribed this when she was having dizziness back in April. Says that she has not been using that recently for the symptoms as she did not realize that this was that same problem. In April she was having different symptoms. I have told her to use the meclizine every 4 hours and then use it as needed. I will also go ahead and  refer her for follow-up with ENT. Staff is irrigating her right ear today to get this cleared. She will return for a visit with me on Wednesday 12/12/2016 to see if her symptoms are controlled with the meclizine and to decide on return to work. She states that at work she is at sitting at a desk part of the time then she gets up and goes through the lines to the lab. Says that every hour she gets up and makes around.  - meclizine (ANTIVERT) 25 MG tablet; Take 1 tablet (25 mg total) by mouth 3 (three) times daily as needed for dizziness.  Dispense: 30 tablet; Refill: 0 - Ambulatory referral to ENT    12/12/2016: Today patient reports that she has been taking the meclizine as directed. She says that it seems to help a little. However still feeling drugged feeling but not as severe/pronounced. Says that it just will not go away. Says that when she is looking at me I look fuzzy. Says that she is careful walking because she doesn't know when she will get that fuzzy feeling. It definitely does worsen with movement and position change. She has gotten no call regarding ENT appointment yet. She does not feel safe to return to work at this time because of ongoing symptoms.    12/26/2016--ADDENDUM ADDED: I [facility] Their note was dated 12/25/16. They suspected vestibular migraine. Recommended referral to neurology. They planned to have her stay out of work 2 more weeks.   Past Medical History:  Diagnosis Date  . Anxiety 2008   seen in ED /w chest pain but told that she was having anxiety  . Arthritis   . Chronic kidney disease 1999   passed kidney stones spontaneously  . History of stress test 2010   done due to chest pain /w Dr. Jacinto HalimGanji. Pt. told that it was wnl.  . Hives of unknown origin    told by PCP Terri Piedra( [place]) to use Zrytec BID to control hives   . Hypertension   . Neuromuscular disorder (HCC)    bacl pain  . Sleep difficulties    has had a sleep study - t old that she  didn't meet criteria for machine   . Syncope      Home Meds: Outpatient Medications Prior to Visit  Medication Sig Dispense Refill  . cetirizine (ZYRTEC) 10 MG tablet Take 10 mg by mouth 2 (two) times daily.    . Cholecalciferol 2000 units TABS Take 1 tablet (2,000 Units total) by mouth daily. 30 each 3  . diltiazem (CARDIZEM CD) 120 MG 24 hr capsule Take 1 capsule (120 mg total) by mouth daily. 30 capsule 5  . doxycycline (  VIBRA-TABS) 100 MG tablet Take 1 tablet (100 mg total) by mouth 2 (two) times daily. 28 tablet 0  . lisinopril (PRINIVIL,ZESTRIL) 10 MG tablet TAKE 1 TABLET BY MOUTH EVERY DAY 90 tablet 1  . meclizine (ANTIVERT) 25 MG tablet Take 1 tablet (25 mg total) by mouth 3 (three) times daily as needed for dizziness. 30 tablet 0   No facility-administered medications prior to visit.     Allergies:  Allergies  Allergen Reactions  . Gabapentin     Dizziness  . Loratadine Rash    Rash and hives   . Penicillins Hives and Rash    Social History   Social History  . Marital status: Single    Spouse name: N/A  . Number of children: 0  . Years of education: N/A   Occupational History  . Radiographer, therapeutic    Social History Main Topics  . Smoking status: Never Smoker  . Smokeless tobacco: Never Used  . Alcohol use No  . Drug use: No  . Sexual activity: Not on file   Other Topics Concern  . Not on file   Social History Narrative  . No narrative on file    Family History  Problem Relation Age of Onset  . Stroke Father   . Diabetes Other   . Hyperlipidemia Other   . Heart disease Other   . Hypertension Other   . Cancer Other      Review of Systems:  See HPI for pertinent ROS. All other ROS negative.    Physical Exam: Blood pressure 138/86, pulse 67, height 5' (1.524 m), weight 259 lb 6.4 oz (117.7 kg), SpO2 96 %., Body mass index is 50.66 kg/m. General: obese WF. Appears in no acute distress. HEENT: Ear exam normal.  Her pharynx appears normal.  Nares appears normal. Dix-Hallpike maneuver is positive. Does reproduce her symptoms. Neck: Supple. No thyromegaly. No lymphadenopathy. Lungs: Clear bilaterally to auscultation without wheezes, rales, or rhonchi. Breathing is unlabored. Heart: RRR with S1 S2. No murmurs, rubs, or gallops. Musculoskeletal:  Strength and tone normal for age. Extremities/Skin: Warm and dry. No edema. No rashes or suspicious lesions. Neuro: Alert and oriented X 3. Moves all extremities spontaneously. Gait is normal. CNII-XII grossly in tact. Psych:  Responds to questions appropriately with a normal affect.     ASSESSMENT AND PLAN:  56 y.o. year old female with      Patient ID: ANNALIESA BLANN MRN: [id number], DOB: 03/18/1961, 56 y.o. Date of Encounter: 12/12/2016, 10:33 AM    Chief Complaint:  Chief Complaint  Patient presents with  . Follow-up    headache, vertigo     HPI: 56 y.o. year old female      Home Meds:   Outpatient Medications Prior to Visit  Medication Sig Dispense Refill  . cetirizine (ZYRTEC) 10 MG tablet Take 10 mg by mouth 2 (two) times daily.    . Cholecalciferol 2000 units TABS Take 1 tablet (2,000 Units total) by mouth daily. 30 each 3  . diltiazem (CARDIZEM CD) 120 MG 24 hr capsule Take 1 capsule (120 mg total) by mouth daily. 30 capsule 5  . doxycycline (VIBRA-TABS) 100 MG tablet Take 1 tablet (100 mg total) by mouth 2 (two) times daily. 28 tablet 0  . lisinopril (PRINIVIL,ZESTRIL) 10 MG tablet TAKE 1 TABLET BY MOUTH EVERY DAY 90 tablet 1  . meclizine (ANTIVERT) 25 MG tablet Take 1 tablet (25 mg total) by mouth 3 (three) times daily as needed for dizziness.  30 tablet 0   No facility-administered medications prior to visit.     Allergies:  Allergies  Allergen Reactions  . Gabapentin     Dizziness  . Loratadine Rash    Rash and hives   . Penicillins Hives and Rash      Review of Systems: See HPI for pertinent ROS. All other ROS negative.    Physical  Exam: Blood pressure 138/86, pulse 67, height 5' (1.524 m), weight 259 lb 6.4 oz (117.7 kg), SpO2 96 %., Body mass index is 50.66 kg/m. General:  Appears in no acute distress. HEENT: Normocephalic, atraumatic, eyes without discharge, sclera non-icteric, nares are without discharge. Bilateral auditory canals clear, TM's are without perforation, pearly grey and translucent with reflective cone of light bilaterally. Oral cavity moist, posterior pharynx without exudate, erythema, peritonsillar abscess, or post nasal drip.  Neck: Supple. No thyromegaly. No lymphadenopathy. Lungs: Clear bilaterally to auscultation without wheezes, rales, or rhonchi. Breathing is unlabored. Heart: Regular rhythm. No murmurs, rubs, or gallops. Abdomen: Soft, non-tender, non-distended with normoactive bowel sounds. No hepatomegaly. No rebound/guarding. No obvious abdominal masses. Msk:  Strength and tone normal for age. Extremities/Skin: Warm and dry. No clubbing or cyanosis. No edema. No rashes or suspicious lesions. Neuro: Alert and oriented X 3. Moves all extremities spontaneously. Gait is normal. CNII-XII grossly in tact. Psych:  Responds to questions appropriately with a normal affect.     ASSESSMENT AND PLAN:  56 y.o. year old female with  1. Benign paroxysmal positional vertigo, unspecified laterality Put in another referral to ENT and have marked it urgent. I discussed with our referral staff. Need to get her in with ENT as soon as possible as she cannot return to work until this improves. Another note to keep her out of work through the rest of this week through 12/14/16. Hopefully she can see ENT in the interim. - Ambulatory referral to ENT   12/26/2016--ADDENDUM ADDED: I received note from [facility]. Their note was dated 12/25/16. They suspected vestibular migraine. Recommended referral to neurology. They planned to have her stay out of work 2 more weeks.    Signed, [address] [place], [place],  [facility] 12/12/2016 10:16 AM

## 2016-12-13 ENCOUNTER — Telehealth: Payer: Self-pay | Admitting: Family Medicine

## 2016-12-13 NOTE — Telephone Encounter (Signed)
The earliest I can get seen by ENT is 12/25/16.  Pt is aware.  They are 1 hour appts so they do not have a lot on the schedule.  Her work note has her out until 12/17/16.  She says she feels fine as long as she doesn't move.  Do want to extend her work note out until ENT appt??

## 2016-12-13 NOTE — Telephone Encounter (Signed)
Yes. Will extend note for out of work through that date.

## 2016-12-13 NOTE — Telephone Encounter (Signed)
Patient is aware she can pick note up at front desk

## 2016-12-25 DIAGNOSIS — G43109 Migraine with aura, not intractable, without status migrainosus: Secondary | ICD-10-CM | POA: Diagnosis not present

## 2016-12-25 DIAGNOSIS — G43809 Other migraine, not intractable, without status migrainosus: Secondary | ICD-10-CM | POA: Insufficient documentation

## 2016-12-25 DIAGNOSIS — H903 Sensorineural hearing loss, bilateral: Secondary | ICD-10-CM | POA: Insufficient documentation

## 2017-01-02 ENCOUNTER — Telehealth: Payer: Self-pay | Admitting: Family Medicine

## 2017-01-02 ENCOUNTER — Encounter: Payer: Self-pay | Admitting: Family Medicine

## 2017-01-02 NOTE — Telephone Encounter (Signed)
Pt called and states that she went to ENT and they put her out of work for 2 weeks and recommended she see a Insurance account managereurologist. She does have an appt on 01/24/17 to see [place] neuro but was wondering if she needs to stay out of work until her appt. As she did have another episode this morning and she is afraid to drive. Please advise.

## 2017-01-02 NOTE — Telephone Encounter (Signed)
Pt aware and note wrote and left up front

## 2017-01-02 NOTE — Telephone Encounter (Signed)
May give her note to be out of work through 01/24/17.

## 2017-01-13 DIAGNOSIS — L501 Idiopathic urticaria: Secondary | ICD-10-CM | POA: Diagnosis not present

## 2017-01-24 ENCOUNTER — Ambulatory Visit (INDEPENDENT_AMBULATORY_CARE_PROVIDER_SITE_OTHER): Payer: 59 | Admitting: Neurology

## 2017-01-24 ENCOUNTER — Encounter: Payer: Self-pay | Admitting: Neurology

## 2017-01-24 ENCOUNTER — Telehealth: Payer: Self-pay | Admitting: Neurology

## 2017-01-24 VITALS — BP 170/87 | HR 81 | Resp 18 | Ht 78.0 in | Wt 258.5 lb

## 2017-01-24 DIAGNOSIS — G4459 Other complicated headache syndrome: Secondary | ICD-10-CM | POA: Diagnosis not present

## 2017-01-24 DIAGNOSIS — H8190 Unspecified disorder of vestibular function, unspecified ear: Secondary | ICD-10-CM | POA: Diagnosis not present

## 2017-01-24 DIAGNOSIS — R51 Headache: Secondary | ICD-10-CM | POA: Diagnosis not present

## 2017-01-24 DIAGNOSIS — I10 Essential (primary) hypertension: Secondary | ICD-10-CM

## 2017-01-24 DIAGNOSIS — R519 Headache, unspecified: Secondary | ICD-10-CM | POA: Insufficient documentation

## 2017-01-24 MED ORDER — LEVETIRACETAM 750 MG PO TABS: 750.0000 mg | ORAL_TABLET | Freq: Two times a day (BID) | ORAL | 3 refills | Status: DC

## 2017-01-24 MED ORDER — ALPRAZOLAM 1 MG PO TABS: ORAL_TABLET | ORAL | 0 refills | Status: DC

## 2017-01-24 NOTE — Telephone Encounter (Signed)
Alprazolam rx. faxed to [facility] [address] per pt's request/fim
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
## 2017-01-24 NOTE — Telephone Encounter (Signed)
Patient is scheduled to have her MRI done at the [facility] mobile unit on Wednesday 01/30/17. She did inform me that she is claustrophic and will need something to help her.

## 2017-01-30 ENCOUNTER — Ambulatory Visit (INDEPENDENT_AMBULATORY_CARE_PROVIDER_SITE_OTHER): Payer: 59

## 2017-01-30 DIAGNOSIS — G4459 Other complicated headache syndrome: Secondary | ICD-10-CM | POA: Diagnosis not present

## 2017-01-30 MED ORDER — GADOPENTETATE DIMEGLUMINE 469.01 MG/ML IV SOLN: 20.0000 mL | Freq: Once | INTRAVENOUS | Status: AC | PRN

## 2017-01-31 ENCOUNTER — Telehealth: Payer: Self-pay | Admitting: *Deleted

## 2017-01-31 NOTE — Telephone Encounter (Signed)
LMOM (identified vm) with below MRI results.  She does not need to return this call unless she has questions/fim 

## 2017-01-31 NOTE — Telephone Encounter (Signed)
-----   Message from Asa Lente, MD sent at 01/31/2017  9:36 AM EDT ----- Please let her know that the MRI was normal for age.  She had a few small spots under the surface of the brain. These could be due to either age, hypertension or migraine. They would not cause any symptoms.

## 2017-02-26 ENCOUNTER — Ambulatory Visit (INDEPENDENT_AMBULATORY_CARE_PROVIDER_SITE_OTHER): Payer: 59 | Admitting: Neurology

## 2017-02-26 ENCOUNTER — Encounter: Payer: Self-pay | Admitting: Neurology

## 2017-02-26 VITALS — BP 159/93 | HR 90 | Resp 18 | Ht 60.0 in | Wt 263.5 lb

## 2017-02-26 DIAGNOSIS — H8190 Unspecified disorder of vestibular function, unspecified ear: Secondary | ICD-10-CM

## 2017-02-26 DIAGNOSIS — R519 Headache, unspecified: Secondary | ICD-10-CM

## 2017-02-26 DIAGNOSIS — G4733 Obstructive sleep apnea (adult) (pediatric): Secondary | ICD-10-CM

## 2017-02-26 DIAGNOSIS — R51 Headache: Secondary | ICD-10-CM | POA: Diagnosis not present

## 2017-02-26 DIAGNOSIS — G4700 Insomnia, unspecified: Secondary | ICD-10-CM | POA: Insufficient documentation

## 2017-02-26 MED ORDER — LEVETIRACETAM 750 MG PO TABS: 750.0000 mg | ORAL_TABLET | Freq: Two times a day (BID) | ORAL | 3 refills | Status: DC

## 2017-02-26 MED ORDER — TEMAZEPAM 15 MG PO CAPS: 15.0000 mg | ORAL_CAPSULE | Freq: Every evening | ORAL | 3 refills | Status: DC | PRN
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
## 2017-03-12 DIAGNOSIS — M25561 Pain in right knee: Secondary | ICD-10-CM | POA: Diagnosis not present

## 2017-03-12 DIAGNOSIS — M1711 Unilateral primary osteoarthritis, right knee: Secondary | ICD-10-CM | POA: Diagnosis not present

## 2017-03-18 ENCOUNTER — Encounter: Payer: Self-pay | Admitting: Physician Assistant

## 2017-03-20 ENCOUNTER — Ambulatory Visit: Payer: 59 | Admitting: Neurology

## 2017-04-11 ENCOUNTER — Ambulatory Visit (INDEPENDENT_AMBULATORY_CARE_PROVIDER_SITE_OTHER): Payer: 59 | Admitting: Physician Assistant

## 2017-04-11 ENCOUNTER — Other Ambulatory Visit: Payer: Self-pay

## 2017-04-11 ENCOUNTER — Encounter: Payer: Self-pay | Admitting: Physician Assistant

## 2017-04-11 VITALS — BP 138/80 | HR 66 | Temp 97.9°F | Resp 16 | Ht 59.5 in | Wt 258.4 lb

## 2017-04-11 DIAGNOSIS — Z23 Encounter for immunization: Secondary | ICD-10-CM

## 2017-04-11 DIAGNOSIS — Z1159 Encounter for screening for other viral diseases: Secondary | ICD-10-CM | POA: Diagnosis not present

## 2017-04-11 DIAGNOSIS — I10 Essential (primary) hypertension: Secondary | ICD-10-CM | POA: Diagnosis not present

## 2017-04-11 DIAGNOSIS — Z0000 Encounter for general adult medical examination without abnormal findings: Secondary | ICD-10-CM | POA: Diagnosis not present

## 2017-04-11 DIAGNOSIS — G4700 Insomnia, unspecified: Secondary | ICD-10-CM | POA: Diagnosis not present

## 2017-04-11 DIAGNOSIS — Z114 Encounter for screening for human immunodeficiency virus [HIV]: Secondary | ICD-10-CM

## 2017-04-11 NOTE — Progress Notes (Addendum)
Patient ID: [name] [name] MRN: [id number], DOB: 10/11/1960, 56 y.o. Date of Encounter: 04/11/2017,   Chief Complaint: Physical (CPE)  HPI: 56 y.o. y/o female  here for CPE.    07/27/2015: She is also being seen as a new patient to establish care.  She states that her last PCP was with Dr. Yehuda BuddSpears but last visit there was 2015. That clinic has recently closed so establishing care here.  She is not fasting today but does have a copy of lipid panel done at work last week that showed triglyceride 121, HDL 50, LDL 89. I discussed doing other labs today she states that yes she does want to go ahead and get everything up-to-date and evaluated.  She recently had hearing test at her work that was abnormal but they told her that there was wax in her ears, that she is to get that cleaned today, and then she says that work is planning to retest her hearing in April.  She makes reference to having evaluation with Cardiology but says that "after all that we found out it was my gabapentin causing the dizziness--I can't take gabapentin ". I added Gabapentin to "Allergy" list today.   Also mentioned that I saw obstructive sleep apnea in her chart and she says that she "was tested for this--but the test was negative"-- per patient.  I told her that her blood pressure is running high today and she says that she does check it at work and it has been running 140 to 150s over 80s to 90s.  No other specific complaints or concerns today.   04/11/2017: She has had some recent visits with me regarding vertigo and associated symptoms.  Today she reports that those symptoms have resolved since she has been taking Keppra. Has no specific concerns to address today.  He has been feeling good. She is talking her Cardizem and lisinopril as directed.  No lightheadedness or adverse effects.   Addendum Added 10/09/2017---- Received note from [facility]-- Dr. Shelle IronBeane.   His note was dated  10/01/2017. Impression: 1-right knee end-stage osteoarthritis, bone on bone, refractory to cortisone injections, Visco supplementation, activity modification, anti-inflammatories and other of OTC medications, quad strengthening, relative rest. 2-obesity--- Meets guidelines for morbid/severe obesity with BMI greater than 40 Plan: She has failed cortisone injections and Visco supplementation at this point therefore we have mutually agreed to hold off on further injection therapies.   We have again discussed weight loss, for her to be able to proceed with knee replacement her BMI would need to be at or below 40 which would be at a weight of approximately 205 pounds.  She has dropped her BMI of one-point since her last visit she is now at 48.6.   Urged her to reengage in the process with the bariatric team.  We will try to expedite that with her and any influence from her PCP would be helpful.  She is already done the classes for the bariatric surgery and went through that whole process previously but was canceled as there was lack of coverage for the surgery itself.  Have encouraged her to continue to work with weight loss efforts on her own as well.   We have given her a note to return to work tomorrow at sedentary duty no climbing ladders.  Have told her I would be glad to send a letter to the person who oversees her disability at work as well as the bariatric team to help expedite the process  for her and help with approval from insurance standpoint.   Her BMI needs to be at or below 40 to proceed with knee replacement and weight loss will also help take pressure off of other knee as we would be doing one at a time to help decrease the risk of recurrent issues with her back following the spinal decompression and would also likely help lower blood pressure.  Her follow-up with [place] as needed at this point but she can call with questions or concerns.  She will keep [place] informed of her status and if knee worsens and  becomes unstable with follow-up sooner needed.    Review of Systems: Consitutional: No fever, chills, fatigue, night sweats, lymphadenopathy. No significant/unexplained weight changes. Eyes: No visual changes, eye redness, or discharge. ENT/Mouth: No ear pain, sore throat, nasal drainage, or sinus pain. Cardiovascular: No chest pressure,heaviness, tightness or squeezing, even with exertion. No increased shortness of breath or dyspnea on exertion.No palpitations, edema, orthopnea, PND. Respiratory: No cough, hemoptysis, SOB, or wheezing. Gastrointestinal: No anorexia, dysphagia, reflux, pain, nausea, vomiting, hematemesis, diarrhea, constipation, BRBPR, or melena. Breast: No mass, nodules, bulging, or retraction. No skin changes or inflammation. No nipple discharge. No lymphadenopathy. Genitourinary: No dysuria, hematuria, incontinence, vaginal discharge, pruritis, burning, abnormal bleeding, or pain. Musculoskeletal: No decreased ROM, No joint pain or swelling. No significant pain in neck, back, or extremities. Skin: No rash, pruritis, or concerning lesions. Neurological: No headache, dizziness, syncope, seizures, tremors, memory loss, coordination problems, or paresthesias. Psychological: No anxiety, depression, hallucinations, SI/HI. Endocrine: No polydipsia, polyphagia, polyuria, or known diabetes.No increased fatigue. No palpitations/rapid heart rate. No significant/unexplained weight change. All other systems were reviewed and are otherwise negative.  Past Medical History:  Diagnosis Date  . Anxiety 2008   seen in ED /w chest pain but told that she was having anxiety  . Arthritis   . Chronic kidney disease 1999   passed kidney stones spontaneously  . History of stress test 2010   done due to chest pain /w Dr. Jacinto Halim. Pt. told that it was wnl.  . Hives of unknown origin    told by PCP Terri Piedra) to use Zrytec BID to control hives   . Hypertension   . Neuromuscular disorder (HCC)     bacl pain  . Sleep difficulties    has had a sleep study - t old that she didn't meet criteria for machine   . Syncope      Past Surgical History:  Procedure Laterality Date  . DECOMPRESSIVE LUMBAR LAMINECTOMY LEVEL 1 N/A 10/28/2013   Procedure: DECOMPRESSION L4-5;  Surgeon: Venita Lick, MD;  Location: [facility] OR;  Service: Orthopedics;  Laterality: N/A;  . KNEE ARTHROSCOPY  12/2008   left knee  . PARTIAL HYSTERECTOMY  10/1994  . thumb surgery  03/1975    Home Meds:  Outpatient Medications Prior to Visit  Medication Sig Dispense Refill  . cetirizine (ZYRTEC) 10 MG tablet Take 10 mg by mouth 2 (two) times daily.    . Cholecalciferol 2000 units TABS Take 1 tablet (2,000 Units total) by mouth daily. 30 each 3  . diltiazem (CARDIZEM CD) 120 MG 24 hr capsule Take 1 capsule (120 mg total) by mouth daily. 30 capsule 5  . levETIRAcetam (KEPPRA) 750 MG tablet Take 1 tablet (750 mg total) by mouth 2 (two) times daily. 60 tablet 3  . meclizine (ANTIVERT) 25 MG tablet Take 1 tablet (25 mg total) by mouth 3 (three) times daily as needed for dizziness. 30  tablet 0  . temazepam (RESTORIL) 15 MG capsule Take 1 capsule (15 mg total) by mouth at bedtime as needed for sleep. 30 capsule 3   Facility-Administered Medications Prior to Visit  Medication Dose Route Frequency Provider Last Rate Last Dose  . gadopentetate dimeglumine (MAGNEVIST) injection 20 mL  20 mL Intravenous Once PRN Sater, Pearletha Furl, MD        Allergies:  Allergies  Allergen Reactions  . Gabapentin     Dizziness  . Loratadine Rash    Rash and hives   . Penicillins Hives and Rash    Social History   Socioeconomic History  . Marital status: Single    Spouse name: Not on file  . Number of children: 0  . Years of education: Not on file  . Highest education level: Not on file  Social Needs  . Financial resource strain: Not on file  . Food insecurity - worry: Not on file  . Food insecurity - inability: Not on file  .  Transportation needs - medical: Not on file  . Transportation needs - non-medical: Not on file  Occupational History  . Occupation: Radiographer, therapeutic  Tobacco Use  . Smoking status: Never Smoker  . Smokeless tobacco: Never Used  Substance and Sexual Activity  . Alcohol use: No  . Drug use: No  . Sexual activity: Not on file  Other Topics Concern  . Not on file  Social History Narrative  . Not on file    Family History  Problem Relation Age of Onset  . Stroke Father   . Diabetes Other   . Hyperlipidemia Other   . Heart disease Other   . Hypertension Other   . Cancer Other     Physical Exam: Blood pressure 138/80, pulse 66, temperature 97.9 F (36.6 C), temperature source Oral, resp. rate 16, height 4' 11.5" (1.511 m), weight 117.2 kg (258 lb 6.4 oz), SpO2 97 %., Body mass index is 51.32 kg/m. General: Well developed, well nourished, WF. Appears in no acute distress. HEENT: Normocephalic, atraumatic. Conjunctiva pink, sclera non-icteric. Pupils 2 mm constricting to 1 mm, round, regular, and equally reactive to light and accomodation. EOMI. Right ear canal obstructed with cerumen impaction. Left ear canal patent. Left TM  with good cone of light and without pathology. Nasal mucosa pink. Nares are without discharge. No sinus tenderness. Oral mucosa pink.  Pharynx without exudate.   Neck: Supple. Trachea midline. No thyromegaly. Full ROM. No lymphadenopathy.No Carotid Bruits. Lungs: Clear to auscultation bilaterally without wheezes, rales, or rhonchi. Breathing is of normal effort and unlabored. Cardiovascular: RRR with S1 S2. No murmurs, rubs, or gallops. Distal pulses 2+ symmetrically. No carotid or abdominal bruits. Breast: Symmetrical. No masses. Nipples without discharge.  She does have dense breast tissue along the lower portion of her breasts bilaterally. Abdomen: Soft, non-tender, non-distended with normoactive bowel sounds. No hepatosplenomegaly or masses. No  rebound/guarding. No CVA tenderness. No hernias.  Genitourinary:  External genitalia without lesions. Vaginal mucosa pink.No discharge present. Bimanual exam c/w prior hysterectomy, exam normal.  Musculoskeletal: Full range of motion and 5/5 strength throughout.  Skin: Warm and moist without erythema, ecchymosis, wounds, or rash. Neuro: A+Ox3. CN II-XII grossly intact. Moves all extremities spontaneously. Full sensation throughout. Normal gait.  Psych:  Responds to questions appropriately with a normal affect.   Assessment/Plan:  56 y.o. y/o female here for CPE  1. Visit for preventive health examination  A. Screening Labs: 04/11/2017:  She is fasting.Check labs now.  -  HIV antibody - Hepatitis C antibody - CBC with Differential/Platelet - COMPLETE METABOLIC PANEL WITH GFR - Lipid panel - TSH - MM DIGITAL SCREENING BILATERAL; Future   B. Pap: She reports that she has had a hysterectomy and does not have history of cancer--- hysterectomy was not performed secondary to cancer. Therefore further Pap smears not indicated. Pelvic exam normal today.  C. Screening Mammogram: 07/27/2015: She reports that she had a mammogram performed October 2016. States that a mobile unit came to her work. 04/11/2017: Reports that she missed getting her mammogram done at the mobile unit at her work.  That is only available 1 day/year.  Is agreeable for me to order mammogram at an outpatient facility. - MM DIGITAL SCREENING BILATERAL; Future  D. DEXA/BMD:  We did not not discuss DEXA scan at this visit. Will discuss at follow-up future visit--- usually wait until closer to age 56 to start this but will discuss what age her hysterectomy was and whether this was a complete hysterectomy as far as menopause and estrogen deficiency. 04/11/2017:  She reports that she had a hysterectomy but did not have oophorectomy.  Still has her ovaries.  Can wait until around age 56 to start bone density scan.  E. Colorectal  Cancer Screening: She states that she had a colonoscopy by Dr. Loreta AveMann in 2016 which showed 2 polyps and she is to repeat 5 years. This will be due 2021.  F. Immunizations:  Influenza:  04/11/2017: States that she missed getting her flu vaccine at work as well so is agreeable to get that today here. Tetanus:   She reports that she had tetanus vaccine December 2016 Pneumococcal:    She has no indication to require a pneumonia vaccine until age 56 Shingrix: Has been on company back-order for months-- Will follow up regarding this at next visit.    2. Screening for HIV (human immunodeficiency virus) - HIV antibody  3. Need for hepatitis C screening test - Hepatitis C antibody  4. Essential hypertension 04/11/2017: BP is controlled.  Continue current medications.  Check lab to monitor. - COMPLETE METABOLIC PANEL WITH GFR  5. Insomnia, unspecified type 04/11/2017: Stable/controlled.  Continue current medication.  6. Flu vaccine need - Flu Vaccine QUAD 36+ mos IM  Morbid obesity due to excess calories (HCC) We will further discuss proper diet and exercise at follow-up office visit.   Obstructive sleep apnea Patient reports that she was evaluated for sleep apnea but the test was negative. At follow-up visit will actually read these records to confirm whether this is accurate information.  Vitamin D Deficiency: He is to continue current dose of vitamin D 2000 units daily.  Vitamin D level -- 07/2016 was 42 and the prior level before that was 48.  This has remained stable so continue current dose.  Addendum Added 10/09/2017---- Received note from [facility]-- Dr. Shelle IronBeane.   His note was dated 10/01/2017. Impression: 1-right knee end-stage osteoarthritis, bone on bone, refractory to cortisone injections, Visco supplementation, activity modification, anti-inflammatories and other of OTC medications, quad strengthening, relative rest. 2-obesity--- Meets guidelines for morbid/severe obesity with  BMI greater than 40 Plan: She has failed cortisone injections and Visco supplementation at this point therefore we have mutually agreed to hold off on further injection therapies.   We have again discussed weight loss, for her to be able to proceed with knee replacement her BMI would need to be at or below 40 which would be at a weight of approximately 205 pounds.  She has dropped her BMI of one-point since her last visit she is now at 48.6.   Urged her to reengage in the process with the bariatric team.  We will try to expedite that with her and any influence from her PCP would be helpful.  She is already done the classes for the bariatric surgery and went through that whole process previously but was canceled as there was lack of coverage for the surgery itself.  Have encouraged her to continue to work with weight loss efforts on her own as well.   We have given her a note to return to work tomorrow at sedentary duty no climbing ladders.  Have told her I would be glad to send a letter to the person who oversees her disability at work as well as the bariatric team to help expedite the process for her and help with approval from insurance standpoint.   Her BMI needs to be at or below 40 to proceed with knee replacement and weight loss will also help take pressure off of other knee as we would be doing one at a time to help decrease the risk of recurrent issues with her back following the spinal decompression and would also likely help lower blood pressure.  Her follow-up with [place] as needed at this point but she can call with questions or concerns.  She will keep [place] informed of her status and if knee worsens and becomes unstable with follow-up sooner needed.   Murray Hodgkins [place], [place], [facility] 04/11/2017 3:02 PM

## 2017-04-13 LAB — CBC WITH DIFFERENTIAL/PLATELET
BASOS ABS: 61 {cells}/uL (ref 0–200)
BASOS PCT: 0.9 %
EOS ABS: 326 {cells}/uL (ref 15–500)
Eosinophils Relative: 4.8 %
HEMATOCRIT: 44.3 % (ref 35.0–45.0)
Hemoglobin: 15.1 g/dL (ref 11.7–15.5)
LYMPHS ABS: 1856 {cells}/uL (ref 850–3900)
MCH: 31.1 pg (ref 27.0–33.0)
MCHC: 34.1 g/dL (ref 32.0–36.0)
MCV: 91.3 fL (ref 80.0–100.0)
MPV: 11.3 fL (ref 7.5–12.5)
Monocytes Relative: 7.3 %
NEUTROS ABS: 4060 {cells}/uL (ref 1500–7800)
Neutrophils Relative %: 59.7 %
Platelets: 290 10*3/uL (ref 140–400)
RBC: 4.85 10*6/uL (ref 3.80–5.10)
RDW: 12.7 % (ref 11.0–15.0)
Total Lymphocyte: 27.3 %
WBC: 6.8 10*3/uL (ref 3.8–10.8)
WBCMIX: 496 {cells}/uL (ref 200–950)

## 2017-04-13 LAB — COMPLETE METABOLIC PANEL WITH GFR
AG RATIO: 1.6 (calc) (ref 1.0–2.5)
ALT: 20 U/L (ref 6–29)
AST: 18 U/L (ref 10–35)
Albumin: 4 g/dL (ref 3.6–5.1)
Alkaline phosphatase (APISO): 104 U/L (ref 33–130)
BILIRUBIN TOTAL: 0.6 mg/dL (ref 0.2–1.2)
BUN: 15 mg/dL (ref 7–25)
CALCIUM: 9.2 mg/dL (ref 8.6–10.4)
CHLORIDE: 107 mmol/L (ref 98–110)
CO2: 23 mmol/L (ref 20–32)
Creat: 0.95 mg/dL (ref 0.50–1.05)
GFR, Est African American: 77 mL/min/{1.73_m2} (ref >=60)
GFR, Est Non African American: 66 mL/min/{1.73_m2} (ref >=60)
GLUCOSE: 59 mg/dL — AB (ref 65–99)
Globulin: 2.5 g/dL (calc) (ref 1.9–3.7)
Potassium: 5.2 mmol/L (ref 3.5–5.3)
Sodium: 144 mmol/L (ref 135–146)
Total Protein: 6.5 g/dL (ref 6.1–8.1)

## 2017-04-13 LAB — LIPID PANEL
CHOLESTEROL: 203 mg/dL — AB (ref <200)
HDL: 56 mg/dL (ref >50)
LDL Cholesterol (Calc): 126 mg/dL (calc) — ABNORMAL HIGH
Non-HDL Cholesterol (Calc): 147 mg/dL (calc) — ABNORMAL HIGH (ref <130)
TRIGLYCERIDES: 99 mg/dL (ref <150)
Total CHOL/HDL Ratio: 3.6 (calc) (ref <5.0)

## 2017-04-13 LAB — HIV ANTIBODY (ROUTINE TESTING W REFLEX): HIV 1&2 Ab, 4th Generation: NONREACTIVE

## 2017-04-13 LAB — HEPATITIS C ANTIBODY
HEP C AB: NONREACTIVE
SIGNAL TO CUT-OFF: 0.01 (ref <1.00)

## 2017-04-13 LAB — TSH: TSH: 1.25 m[IU]/L (ref 0.40–4.50)

## 2017-04-17 ENCOUNTER — Ambulatory Visit
Admission: RE | Admit: 2017-04-17 | Discharge: 2017-04-17 | Disposition: A | Discharge status: Home / Self Care | Payer: 59 | Source: Ambulatory Visit | Attending: Physician Assistant | Admitting: Physician Assistant

## 2017-04-17 DIAGNOSIS — Z0000 Encounter for general adult medical examination without abnormal findings: Secondary | ICD-10-CM

## 2017-04-17 DIAGNOSIS — Z1231 Encounter for screening mammogram for malignant neoplasm of breast: Secondary | ICD-10-CM | POA: Diagnosis not present

## 2017-05-03 DIAGNOSIS — M1711 Unilateral primary osteoarthritis, right knee: Secondary | ICD-10-CM | POA: Diagnosis not present

## 2017-05-03 DIAGNOSIS — M25561 Pain in right knee: Secondary | ICD-10-CM | POA: Insufficient documentation

## 2017-05-22 ENCOUNTER — Other Ambulatory Visit: Payer: Self-pay | Admitting: Physician Assistant

## 2017-05-22 DIAGNOSIS — R001 Bradycardia, unspecified: Secondary | ICD-10-CM

## 2017-05-22 NOTE — Telephone Encounter (Signed)
Refill appropriate 

## 2017-06-10 DIAGNOSIS — M1711 Unilateral primary osteoarthritis, right knee: Secondary | ICD-10-CM | POA: Diagnosis not present

## 2017-06-17 DIAGNOSIS — M1711 Unilateral primary osteoarthritis, right knee: Secondary | ICD-10-CM | POA: Diagnosis not present

## 2017-06-25 DIAGNOSIS — M1711 Unilateral primary osteoarthritis, right knee: Secondary | ICD-10-CM | POA: Diagnosis not present

## 2017-07-02 ENCOUNTER — Other Ambulatory Visit: Payer: Self-pay

## 2017-07-02 ENCOUNTER — Ambulatory Visit: Payer: 59 | Admitting: Neurology

## 2017-07-02 ENCOUNTER — Encounter: Payer: Self-pay | Admitting: Neurology

## 2017-07-02 VITALS — BP 152/97 | HR 74 | Resp 20 | Ht 59.5 in | Wt 262.5 lb

## 2017-07-02 DIAGNOSIS — R51 Headache: Secondary | ICD-10-CM

## 2017-07-02 DIAGNOSIS — H8190 Unspecified disorder of vestibular function, unspecified ear: Secondary | ICD-10-CM | POA: Diagnosis not present

## 2017-07-02 DIAGNOSIS — R9089 Other abnormal findings on diagnostic imaging of central nervous system: Secondary | ICD-10-CM | POA: Diagnosis not present

## 2017-07-02 DIAGNOSIS — G4700 Insomnia, unspecified: Secondary | ICD-10-CM | POA: Diagnosis not present

## 2017-07-02 DIAGNOSIS — R519 Headache, unspecified: Secondary | ICD-10-CM

## 2017-07-02 MED ORDER — TEMAZEPAM 15 MG PO CAPS: 15.0000 mg | ORAL_CAPSULE | Freq: Every evening | ORAL | 1 refills | Status: DC | PRN

## 2017-07-02 MED ORDER — LEVETIRACETAM 750 MG PO TABS: 750.0000 mg | ORAL_TABLET | Freq: Two times a day (BID) | ORAL | 3 refills | Status: DC
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
## 2017-08-01 ENCOUNTER — Ambulatory Visit: Payer: 59 | Admitting: Physician Assistant

## 2017-08-01 ENCOUNTER — Encounter: Payer: Self-pay | Admitting: Physician Assistant

## 2017-08-01 VITALS — BP 142/86 | HR 85 | Temp 98.0°F | Resp 18 | Ht 59.5 in | Wt 262.0 lb

## 2017-08-01 DIAGNOSIS — J029 Acute pharyngitis, unspecified: Secondary | ICD-10-CM

## 2017-08-01 DIAGNOSIS — R52 Pain, unspecified: Secondary | ICD-10-CM | POA: Diagnosis not present

## 2017-08-01 DIAGNOSIS — J101 Influenza due to other identified influenza virus with other respiratory manifestations: Secondary | ICD-10-CM | POA: Diagnosis not present

## 2017-08-01 LAB — INFLUENZA A AND B AG, IMMUNOASSAY
INFLUENZA A ANTIGEN: DETECTED — AB
INFLUENZA B ANTIGEN: NOT DETECTED

## 2017-08-01 MED ORDER — HYDROCODONE-HOMATROPINE 5-1.5 MG/5ML PO SYRP: 5.0000 mL | ORAL_SOLUTION | Freq: Four times a day (QID) | ORAL | 0 refills | Status: DC | PRN

## 2017-08-01 MED ORDER — OSELTAMIVIR PHOSPHATE 75 MG PO CAPS: 75.0000 mg | ORAL_CAPSULE | Freq: Two times a day (BID) | ORAL | 0 refills | Status: DC

## 2017-08-01 NOTE — Progress Notes (Addendum)
Patient ID: [name] [name] MRN: [id number], DOB: 09/10/1960, 57 y.o. Date of Encounter: 08/01/2017, 10:32 AM    Chief Complaint:  Chief Complaint  Patient presents with  . Headache    started 4/2  . Sore Throat  . Nasal Congestion  . Fatigue     HPI: 57 y.o. year old female presents with above.   Names off a long list of things that she has been dealing with over the past 1-2 weeks. Her significant other, who is also a patient of mine, is currently in the hospital. The sister of her significant other that has been in hospice is actively dying. Zerina"s cousin died right after patient significant other went to the hospital. Also in the past week patient's brother had to go to the hospital. She says on top of it all she just started a new job on Tuesday and she had to call out of work yesterday.   Says that she was in bed all day yesterday felt absolutely exhausted. Reports that on Tuesday night which was 07/30/17 is when she developed cough and chest congestion and some runny nose.  Says that she can feel a lot of congestion in her chest and has repetitive cough.  Has had no known fevers.  Minimal sore throat.     Home Meds:   Outpatient Medications Prior to Visit  Medication Sig Dispense Refill  . cetirizine (ZYRTEC) 10 MG tablet Take 10 mg by mouth 2 (two) times daily.    . Cholecalciferol 2000 units TABS Take 1 tablet (2,000 Units total) by mouth daily. 30 each 3  . diltiazem (CARDIZEM CD) 120 MG 24 hr capsule TAKE 1 CAPSULE BY MOUTH EVERY DAY 30 capsule 5  . levETIRAcetam (KEPPRA) 750 MG tablet Take 1 tablet (750 mg total) by mouth 2 (two) times daily. 180 tablet 3  . temazepam (RESTORIL) 15 MG capsule Take 1 capsule (15 mg total) by mouth at bedtime as needed for sleep. 90 capsule 1  . meclizine (ANTIVERT) 25 MG tablet Take 1 tablet (25 mg total) by mouth 3 (three) times daily as needed for dizziness. (Patient not taking: Reported on 07/02/2017) 30 tablet 0    Facility-Administered Medications Prior to Visit  Medication Dose Route Frequency Provider Last Rate Last Dose  . gadopentetate dimeglumine (MAGNEVIST) injection 20 mL  20 mL Intravenous Once PRN Sater, Pearletha Furlichard A, MD        Allergies:  Allergies  Allergen Reactions  . Gabapentin     Dizziness  . Loratadine Rash    Rash and hives   . Penicillins Hives and Rash      Review of Systems: See HPI for pertinent ROS. All other ROS negative.    Physical Exam: Blood pressure (!) 142/86, pulse 85, temperature 98 F (36.7 C), temperature source Oral, resp. rate 18, height 4' 11.5" (1.511 m), weight 118.8 kg (262 lb), SpO2 96 %., Body mass index is 52.03 kg/m. General:  WF. Repetitive cough through entire visit. Appears in no acute distress. HEENT: Normocephalic, atraumatic, eyes without discharge, sclera non-icteric, nares are without discharge. Bilateral auditory canals clear, TM's are without perforation, pearly grey and translucent with reflective cone of light bilaterally. Oral cavity moist, posterior pharynx without exudate, erythema, peritonsillar abscess.  Neck: Supple. No thyromegaly. No lymphadenopathy. Lungs: Clear bilaterally to auscultation without wheezes, rales, or rhonchi. Breathing is unlabored. Heart: Regular rhythm. No murmurs, rubs, or gallops. Msk:  Strength and tone normal for age. Extremities/Skin: Warm and dry.  Neuro: Alert and oriented X 3. Moves all extremities spontaneously. Gait is normal. CNII-XII grossly in tact. Psych:  Responds to questions appropriately with a normal affect.   Results for orders placed or performed in visit on 08/01/17  STREP GROUP A AG, W/REFLEX TO CULT  Result Value Ref Range   Streptococcus, Group A Screen (Direct) NONE DETECTED   Influenza A and B Ag, Immunoassay  Result Value Ref Range   Source: NASOPHARYNX    INFLUENZA A ANTIGEN DETECTED (A) NOT DETECT   INFLUENZA B ANTIGEN NOT DETECTED NOT DETECT     ASSESSMENT AND PLAN:   56 y.o. year old female with   1. Influenza A Test is positive for influenza A.  She is to start the Tamiflu as soon as possible and take as directed.  Also can use Hycodan as cough suppressant.  Note given to cover her missing work yesterday and to cover being out of work through April 8th.  If symptoms worsen significantly or persist greater than 1 week then follow-up. - oseltamivir (TAMIFLU) 75 MG capsule; Take 1 capsule (75 mg total) by mouth 2 (two) times daily.  Dispense: 10 capsule; Refill: 0 - HYDROcodone-homatropine (HYCODAN) 5-1.5 MG/5ML syrup; Take 5 mLs by mouth every 6 (six) hours as needed.  Dispense: 120 mL; Refill: 0  2. Sore throat - STREP GROUP A AG, W/REFLEX TO CULT  3. Body aches - Influenza A and B Ag, Immunoassay   Signed, [address] [place], [place], [facility] 08/01/2017 10:32 AM

## 2017-08-03 LAB — CULTURE, GROUP A STREP
MICRO NUMBER:: 90417308
SOURCE:: 0
SPECIMEN QUALITY:: ADEQUATE

## 2017-08-03 LAB — STREP GROUP A AG, W/REFLEX TO CULT: STREPTOCOCCUS, GROUP A SCREEN (DIRECT): NOT DETECTED

## 2017-08-05 ENCOUNTER — Telehealth: Payer: Self-pay

## 2017-08-05 NOTE — Telephone Encounter (Signed)
Patient called and states she is having congestion and is under a lot of stress and anxiety, Patient states she has called her job to see about seeing a counselor to help with the anxiety due to significant other being in ICU and her brother having surgery as well as other family issues.  Patient was advised she could use otc mucinex to help with congestion and that if her symptoms had not improved by 4/11 then to follow up with our office

## 2017-08-20 ENCOUNTER — Telehealth: Payer: Self-pay | Admitting: Physician Assistant

## 2017-08-20 NOTE — Telephone Encounter (Signed)
FMLA forms dropped off placed into yellow folder.

## 2017-08-21 NOTE — Telephone Encounter (Signed)
[facility]

## 2017-08-22 NOTE — Telephone Encounter (Signed)
Form completed faxed over called placed to patient spoke with debra

## 2017-09-03 DIAGNOSIS — M1711 Unilateral primary osteoarthritis, right knee: Secondary | ICD-10-CM | POA: Diagnosis not present

## 2017-09-03 DIAGNOSIS — M48061 Spinal stenosis, lumbar region without neurogenic claudication: Secondary | ICD-10-CM | POA: Diagnosis not present

## 2017-09-03 DIAGNOSIS — M5417 Radiculopathy, lumbosacral region: Secondary | ICD-10-CM | POA: Diagnosis not present

## 2017-10-01 DIAGNOSIS — M1711 Unilateral primary osteoarthritis, right knee: Secondary | ICD-10-CM | POA: Diagnosis not present

## 2017-10-28 DIAGNOSIS — M25561 Pain in right knee: Secondary | ICD-10-CM | POA: Diagnosis not present

## 2017-10-28 DIAGNOSIS — M1712 Unilateral primary osteoarthritis, left knee: Secondary | ICD-10-CM | POA: Diagnosis not present

## 2017-11-25 DIAGNOSIS — M1711 Unilateral primary osteoarthritis, right knee: Secondary | ICD-10-CM | POA: Diagnosis not present

## 2017-12-03 ENCOUNTER — Other Ambulatory Visit: Payer: Self-pay | Admitting: Physician Assistant

## 2017-12-03 DIAGNOSIS — R001 Bradycardia, unspecified: Secondary | ICD-10-CM

## 2017-12-23 DIAGNOSIS — M25561 Pain in right knee: Secondary | ICD-10-CM | POA: Diagnosis not present

## 2017-12-23 DIAGNOSIS — M1711 Unilateral primary osteoarthritis, right knee: Secondary | ICD-10-CM | POA: Diagnosis not present

## 2017-12-28 DIAGNOSIS — M25561 Pain in right knee: Secondary | ICD-10-CM | POA: Diagnosis not present
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
## 2018-01-02 ENCOUNTER — Encounter: Payer: Self-pay | Admitting: Nurse Practitioner

## 2018-01-02 ENCOUNTER — Ambulatory Visit: Payer: 59 | Admitting: Nurse Practitioner

## 2018-01-02 VITALS — BP 134/86 | HR 64 | Ht 59.5 in | Wt 245.1 lb

## 2018-01-02 DIAGNOSIS — R519 Headache, unspecified: Secondary | ICD-10-CM

## 2018-01-02 DIAGNOSIS — G4700 Insomnia, unspecified: Secondary | ICD-10-CM

## 2018-01-02 DIAGNOSIS — H8190 Unspecified disorder of vestibular function, unspecified ear: Secondary | ICD-10-CM

## 2018-01-02 DIAGNOSIS — R51 Headache: Secondary | ICD-10-CM

## 2018-01-02 MED ORDER — TEMAZEPAM 15 MG PO CAPS: 15.0000 mg | ORAL_CAPSULE | Freq: Every evening | ORAL | 1 refills | Status: DC | PRN

## 2018-01-02 NOTE — Patient Instructions (Signed)
Continue Keppra 750 daily Continue temazepam as needed Good luck with your knee surgery Follow-up in 8 months

## 2018-01-02 NOTE — Progress Notes (Signed)
I have read the note, and I agree with the clinical assessment and plan.  Justn Quale A. Jeany Seville, MD, PhD, FAAN Certified in Neurology, Clinical Neurophysiology, Sleep Medicine, Pain Medicine and Neuroimaging  [facility] [address], [address] [place], [place] [address] [phone]  

## 2018-01-07 DIAGNOSIS — Z6841 Body Mass Index (BMI) 40.0 and over, adult: Secondary | ICD-10-CM | POA: Diagnosis not present

## 2018-01-07 DIAGNOSIS — M1711 Unilateral primary osteoarthritis, right knee: Secondary | ICD-10-CM | POA: Diagnosis not present

## 2018-01-07 DIAGNOSIS — M25561 Pain in right knee: Secondary | ICD-10-CM | POA: Diagnosis not present

## 2018-01-23 ENCOUNTER — Ambulatory Visit (INDEPENDENT_AMBULATORY_CARE_PROVIDER_SITE_OTHER): Payer: 59

## 2018-01-23 DIAGNOSIS — Z23 Encounter for immunization: Secondary | ICD-10-CM

## 2018-01-23 NOTE — Progress Notes (Signed)
Patient was in office for flu vaccine.Patient received vaccine in her left deltoid. Patient tolerated well.  

## 2018-01-27 DIAGNOSIS — S83232A Complex tear of medial meniscus, current injury, left knee, initial encounter: Secondary | ICD-10-CM | POA: Diagnosis not present

## 2018-01-27 DIAGNOSIS — M948X6 Other specified disorders of cartilage, lower leg: Secondary | ICD-10-CM | POA: Diagnosis not present

## 2018-01-27 DIAGNOSIS — G8918 Other acute postprocedural pain: Secondary | ICD-10-CM | POA: Diagnosis not present

## 2018-01-27 DIAGNOSIS — S83231A Complex tear of medial meniscus, current injury, right knee, initial encounter: Secondary | ICD-10-CM | POA: Diagnosis not present

## 2018-01-27 DIAGNOSIS — M1711 Unilateral primary osteoarthritis, right knee: Secondary | ICD-10-CM | POA: Diagnosis not present

## 2018-01-27 HISTORY — PX: KNEE ARTHROSCOPY: SUR90

## 2018-03-24 ENCOUNTER — Other Ambulatory Visit: Payer: Self-pay | Admitting: Family Medicine

## 2018-03-24 DIAGNOSIS — Z1231 Encounter for screening mammogram for malignant neoplasm of breast: Secondary | ICD-10-CM

## 2018-04-17 ENCOUNTER — Encounter: Payer: 59 | Admitting: Physician Assistant

## 2018-04-17 ENCOUNTER — Encounter: Payer: Self-pay | Admitting: Family Medicine

## 2018-04-17 ENCOUNTER — Ambulatory Visit (INDEPENDENT_AMBULATORY_CARE_PROVIDER_SITE_OTHER): Payer: 59 | Admitting: Family Medicine

## 2018-04-17 VITALS — BP 142/90 | HR 68 | Temp 98.2°F | Resp 18 | Ht 60.0 in | Wt 246.0 lb

## 2018-04-17 DIAGNOSIS — R39198 Other difficulties with micturition: Secondary | ICD-10-CM

## 2018-04-17 DIAGNOSIS — Z0000 Encounter for general adult medical examination without abnormal findings: Secondary | ICD-10-CM

## 2018-04-17 DIAGNOSIS — Z124 Encounter for screening for malignant neoplasm of cervix: Secondary | ICD-10-CM

## 2018-04-17 LAB — URINALYSIS, ROUTINE W REFLEX MICROSCOPIC
Bacteria, UA: NONE SEEN /HPF
Bilirubin Urine: NEGATIVE
Glucose, UA: NEGATIVE
KETONES UR: NEGATIVE
Nitrite: NEGATIVE
PH: 6 (ref 5.0–8.0)
Protein, ur: NEGATIVE
Specific Gravity, Urine: 1.002 (ref 1.001–1.03)

## 2018-04-17 LAB — MICROSCOPIC MESSAGE

## 2018-04-17 MED ORDER — HYDROCHLOROTHIAZIDE 12.5 MG PO CAPS: 12.5000 mg | ORAL_CAPSULE | Freq: Every day | ORAL | 3 refills | Status: DC

## 2018-04-17 NOTE — Progress Notes (Signed)
Subjective:    Patient ID: [name] [name], female    DOB: 1960/11/02, 57 y.o.   MRN: [id number]  HPI Patient is a 57 year old female here today to establish care with me.  Previously she had seen my partner Frazier Richards who is recently retired.  Past medical history significant for morbid obesity, obstructive sleep apnea, vestibular migraines for which she takes Keppra, and hypertension.  Blood pressure today is slightly elevated at 142/90.  Patient states that her blood pressure has been similarly elevated at other doctor's offices.  She initially try to treat this by increasing her Cardizem from 120-180 however she suffered from profound bradycardia when that happened.  She has never tried any other antihypertensive medication.  She has her mammogram scheduled for later this month.  Her last mammogram was December 2018.  She is due for a Pap smear however upon further review of her chart, the patient had a hysterectomy performed in the past for noncancerous reasons/vaginal bleeding.  Therefore she does not require Pap smear.  Her last colonoscopy was in 2016 and was significant for a polyp and therefore they recommended a repeat in 5 years so she will be due for this in 2021.  Otherwise she is doing well with no concerns Past Medical History:  Diagnosis Date  . Anxiety 2008   seen in ED /w chest pain but told that she was having anxiety  . Arthritis   . Chronic kidney disease 1999   passed kidney stones spontaneously  . History of stress test 2010   done due to chest pain /w Dr. Jacinto Halim. Pt. told that it was wnl.  . Hives of unknown origin    told by PCP Terri Piedra) to use Zrytec BID to control hives   . Hypertension   . Neuromuscular disorder (HCC)    bacl pain  . Sleep difficulties    has had a sleep study - t old that she didn't meet criteria for machine   . Syncope    Past Surgical History:  Procedure Laterality Date  . DECOMPRESSIVE LUMBAR LAMINECTOMY LEVEL 1 N/A 10/28/2013    Procedure: DECOMPRESSION L4-5;  Surgeon: Venita Lick, MD;  Location: [facility] OR;  Service: Orthopedics;  Laterality: N/A;  . KNEE ARTHROSCOPY  12/2008   left knee  . PARTIAL HYSTERECTOMY  10/1994  . thumb surgery  03/1975   Current Outpatient Medications on File Prior to Visit  Medication Sig Dispense Refill  . Capsaicin-Menthol (SALONPAS GEL EX) Apply topically.    . cetirizine (ZYRTEC) 10 MG tablet Take 10 mg by mouth 2 (two) times daily.    . cyclobenzaprine (FLEXERIL) 10 MG tablet Take 10 mg by mouth 3 (three) times daily as needed for muscle spasms.    Marland Kitchen diltiazem (CARDIZEM CD) 120 MG 24 hr capsule TAKE 1 CAPSULE BY MOUTH EVERY DAY 30 capsule 5  . levETIRAcetam (KEPPRA) 750 MG tablet Take 1 tablet (750 mg total) by mouth 2 (two) times daily. (Patient taking differently: Take 750 mg by mouth daily. ) 180 tablet 3  . Lysine 500 MG CAPS Take by mouth.    . temazepam (RESTORIL) 15 MG capsule Take 1 capsule (15 mg total) by mouth at bedtime as needed for sleep. 90 capsule 1   Current Facility-Administered Medications on File Prior to Visit  Medication Dose Route Frequency Provider Last Rate Last Dose  . gadopentetate dimeglumine (MAGNEVIST) injection 20 mL  20 mL Intravenous Once PRN Sater, Pearletha Furl, MD  Allergies  Allergen Reactions  . Gabapentin     Dizziness  . Loratadine Rash    Rash and hives   . Penicillins Hives and Rash   Social History   Socioeconomic History  . Marital status: Single    Spouse name: Not on file  . Number of children: 0  . Years of education: Not on file  . Highest education level: Not on file  Occupational History  . Occupation: Radiographer, therapeuticmanufacturing technician  Social Needs  . Financial resource strain: Not on file  . Food insecurity:    Worry: Not on file    Inability: Not on file  . Transportation needs:    Medical: Not on file    Non-medical: Not on file  Tobacco Use  . Smoking status: Never Smoker  . Smokeless tobacco: Never Used  Substance  and Sexual Activity  . Alcohol use: No  . Drug use: No  . Sexual activity: Not on file  Lifestyle  . Physical activity:    Days per week: Not on file    Minutes per session: Not on file  . Stress: Not on file  Relationships  . Social connections:    Talks on phone: Not on file    Gets together: Not on file    Attends religious service: Not on file    Active member of club or organization: Not on file    Attends meetings of clubs or organizations: Not on file    Relationship status: Not on file  . Intimate partner violence:    Fear of current or ex partner: Not on file    Emotionally abused: Not on file    Physically abused: Not on file    Forced sexual activity: Not on file  Other Topics Concern  . Not on file  Social History Narrative  . Not on file   Family History  Problem Relation Age of Onset  . Stroke Father   . Diabetes Other   . Hyperlipidemia Other   . Heart disease Other   . Hypertension Other   . Cancer Other       Review of Systems  All other systems reviewed and are negative.      Objective:   Physical Exam Vitals signs reviewed.  Constitutional:      General: She is not in acute distress.    Appearance: Normal appearance. She is obese. She is not ill-appearing, toxic-appearing or diaphoretic.  HENT:     Head: Normocephalic and atraumatic.     Right Ear: Tympanic membrane, ear canal and external ear normal. There is no impacted cerumen.     Left Ear: Tympanic membrane, ear canal and external ear normal. There is no impacted cerumen.     Nose: Nose normal. No congestion or rhinorrhea.     Mouth/Throat:     Pharynx: No oropharyngeal exudate or posterior oropharyngeal erythema.  Eyes:     Conjunctiva/sclera: Conjunctivae normal.     Pupils: Pupils are equal, round, and reactive to light.  Neck:     Musculoskeletal: Normal range of motion and neck supple.     Vascular: No carotid bruit.  Cardiovascular:     Rate and Rhythm: Normal rate and  regular rhythm.     Pulses: Normal pulses.     Heart sounds: Normal heart sounds. No murmur. No friction rub. No gallop.   Pulmonary:     Effort: Pulmonary effort is normal. No respiratory distress.     Breath sounds: Normal breath  sounds. No stridor. No wheezing, rhonchi or rales.  Chest:     Chest wall: No tenderness.  Abdominal:     General: Bowel sounds are normal. There is no distension.     Palpations: Abdomen is soft. There is no mass.     Tenderness: There is no abdominal tenderness. There is no right CVA tenderness, left CVA tenderness, guarding or rebound.     Hernia: No hernia is present.  Musculoskeletal:     Right lower leg: No edema.     Left lower leg: No edema.  Lymphadenopathy:     Cervical: No cervical adenopathy.  Skin:    General: Skin is warm and dry.     Coloration: Skin is not jaundiced or pale.     Findings: No bruising, erythema, lesion or rash.  Neurological:     General: No focal deficit present.     Mental Status: She is alert and oriented to person, place, and time.     Cranial Nerves: No cranial nerve deficit.     Sensory: No sensory deficit.     Motor: No weakness.     Coordination: Coordination normal.     Gait: Gait normal.     Deep Tendon Reflexes: Reflexes normal.           Assessment & Plan:  General medical exam - Plan: CBC with Differential/Platelet, COMPLETE METABOLIC PANEL WITH GFR, Lipid panel, Urinalysis, Routine w reflex microscopic, CANCELED: PAP, Thin Prep w/HPV rflx HPV Type 16/18  Cervical cancer screening - Plan: CANCELED: PAP, Thin Prep w/HPV rflx HPV Type 16/18  Abnormal urination - Plan: Urinalysis, Routine w reflex microscopic   Physical exam today is significant for morbid obesity.  Pap smear was not performed today due to her history of a hysterectomy.  Colonoscopy was performed in 2016 and is not due again until 2021 due to a history of colon polyps.  Therefore cancer screening is up-to-date or will be up-to-date by  the end of this month.  Patient is already had her flu shot as well as her tetanus shot.  Therefore her immunizations are up-to-date.  Blood pressure today is elevated as it is at home.  Therefore I recommended adding hydrochlorothiazide 12.5 mg p.o. daily and rechecking blood pressure in 1 month.  Check CBC, CMP, fasting lipid panel as part of her general preventative care.  Recommend therapeutic lifestyle changes

## 2018-04-18 LAB — CBC WITH DIFFERENTIAL/PLATELET
Absolute Monocytes: 449 cells/uL (ref 200–950)
Basophils Absolute: 40 cells/uL (ref 0–200)
Basophils Relative: 0.6 %
EOS PCT: 3.6 %
Eosinophils Absolute: 238 cells/uL (ref 15–500)
HCT: 43.5 % (ref 35.0–45.0)
Hemoglobin: 14.8 g/dL (ref 11.7–15.5)
LYMPHS ABS: 1914 {cells}/uL (ref 850–3900)
MCH: 32 pg (ref 27.0–33.0)
MCHC: 34 g/dL (ref 32.0–36.0)
MCV: 94 fL (ref 80.0–100.0)
MPV: 11.7 fL (ref 7.5–12.5)
Monocytes Relative: 6.8 %
NEUTROS PCT: 60 %
Neutro Abs: 3960 cells/uL (ref 1500–7800)
Platelets: 267 10*3/uL (ref 140–400)
RBC: 4.63 10*6/uL (ref 3.80–5.10)
RDW: 12.3 % (ref 11.0–15.0)
Total Lymphocyte: 29 %
WBC: 6.6 10*3/uL (ref 3.8–10.8)

## 2018-04-18 LAB — COMPLETE METABOLIC PANEL WITH GFR
AG Ratio: 1.9 (calc) (ref 1.0–2.5)
ALT: 14 U/L (ref 6–29)
AST: 17 U/L (ref 10–35)
Albumin: 4.2 g/dL (ref 3.6–5.1)
Alkaline phosphatase (APISO): 100 U/L (ref 33–130)
BUN: 13 mg/dL (ref 7–25)
CO2: 24 mmol/L (ref 20–32)
Calcium: 9.2 mg/dL (ref 8.6–10.4)
Chloride: 104 mmol/L (ref 98–110)
Creat: 0.79 mg/dL (ref 0.50–1.05)
GFR, Est African American: 96 mL/min/{1.73_m2} (ref >=60)
GFR, Est Non African American: 83 mL/min/{1.73_m2} (ref >=60)
Globulin: 2.2 g/dL (calc) (ref 1.9–3.7)
Glucose, Bld: 70 mg/dL (ref 65–99)
Potassium: 4.1 mmol/L (ref 3.5–5.3)
Sodium: 139 mmol/L (ref 135–146)
Total Bilirubin: 0.7 mg/dL (ref 0.2–1.2)
Total Protein: 6.4 g/dL (ref 6.1–8.1)

## 2018-04-18 LAB — LIPID PANEL
Cholesterol: 208 mg/dL — ABNORMAL HIGH (ref <200)
HDL: 42 mg/dL — AB (ref >50)
LDL Cholesterol (Calc): 141 mg/dL (calc) — ABNORMAL HIGH
Non-HDL Cholesterol (Calc): 166 mg/dL (calc) — ABNORMAL HIGH (ref <130)
Total CHOL/HDL Ratio: 5 (calc) — ABNORMAL HIGH (ref <5.0)
Triglycerides: 127 mg/dL (ref <150)

## 2018-04-21 ENCOUNTER — Other Ambulatory Visit: Payer: Self-pay | Admitting: Family Medicine

## 2018-04-21 MED ORDER — ATORVASTATIN CALCIUM 20 MG PO TABS: 20.0000 mg | ORAL_TABLET | Freq: Every day | ORAL | 1 refills | Status: DC

## 2018-04-24 ENCOUNTER — Ambulatory Visit
Admission: RE | Admit: 2018-04-24 | Discharge: 2018-04-24 | Disposition: A | Discharge status: Home / Self Care | Payer: 59 | Source: Ambulatory Visit | Attending: Family Medicine | Admitting: Family Medicine

## 2018-04-24 DIAGNOSIS — Z1231 Encounter for screening mammogram for malignant neoplasm of breast: Secondary | ICD-10-CM | POA: Diagnosis not present

## 2018-05-20 ENCOUNTER — Encounter: Payer: Self-pay | Admitting: Family Medicine

## 2018-05-20 ENCOUNTER — Ambulatory Visit: Payer: 59 | Admitting: Family Medicine

## 2018-05-20 VITALS — BP 138/74 | HR 76 | Temp 98.3°F | Resp 16 | Ht 60.0 in | Wt 242.0 lb

## 2018-05-20 DIAGNOSIS — I10 Essential (primary) hypertension: Secondary | ICD-10-CM

## 2018-05-20 NOTE — Progress Notes (Signed)
Subjective:    Patient ID: [name] [name], female    DOB: 08/28/1960, 58 y.o.   MRN: [id number]  HPI  04/17/18 Patient is a 58 year old female here today to establish care with me.  Previously she had seen my partner Frazier RichardsMary Beth Dixon who is recently retired.  Past medical history significant for morbid obesity, obstructive sleep apnea, vestibular migraines for which she takes Keppra, and hypertension.  Blood pressure today is slightly elevated at 142/90.  Patient states that her blood pressure has been similarly elevated at other doctor's offices.  She initially try to treat this by increasing her Cardizem from 120-180 however she suffered from profound bradycardia when that happened.  She has never tried any other antihypertensive medication.  She has her mammogram scheduled for later this month.  Her last mammogram was December 2018.  She is due for a Pap smear however upon further review of her chart, the patient had a hysterectomy performed in the past for noncancerous reasons/vaginal bleeding.  Therefore she does not require Pap smear.  Her last colonoscopy was in 2016 and was significant for a polyp and therefore they recommended a repeat in 5 years so she will be due for this in 2021.  Otherwise she is doing well with no concerns.  At that time, my plan was: Physical exam today is significant for morbid obesity.  Pap smear was not performed today due to her history of a hysterectomy.  Colonoscopy was performed in 2016 and is not due again until 2021 due to a history of colon polyps.  Therefore cancer screening is up-to-date or will be up-to-date by the end of this month.  Patient is already had her flu shot as well as her tetanus shot.  Therefore her immunizations are up-to-date.  Blood pressure today is elevated as it is at home.  Therefore I recommended adding hydrochlorothiazide 12.5 mg p.o. daily and rechecking blood pressure in 1 month.  Check CBC, CMP, fasting lipid panel as part of her general  preventative care.  Recommend therapeutic lifestyle changes  05/20/18 Here today to recheck blood pressure on HCTZ.  I also recommended adding lipitor due to elevated LDL (please see labs).  Blood pressure is much better since the addition of HCTZ.  She is checking her blood pressure at home and her systolic blood pressures are averaging in the 130s.  Diastolic blood pressures are 60-70.  She denies any chest pain or shortness of breath.  She did start Lipitor.  She denies any myalgias or right upper quadrant pain. Past Medical History:  Diagnosis Date  . Anxiety 2008   seen in ED /w chest pain but told that she was having anxiety  . Arthritis   . Chronic kidney disease 1999   passed kidney stones spontaneously  . History of stress test 2010   done due to chest pain /w Dr. Jacinto HalimGanji. Pt. told that it was wnl.  . Hives of unknown origin    told by PCP Terri Piedra( [place]) to use Zrytec BID to control hives   . Hypertension   . Neuromuscular disorder (HCC)    bacl pain  . Sleep difficulties    has had a sleep study - t old that she didn't meet criteria for machine   . Syncope    Past Surgical History:  Procedure Laterality Date  . DECOMPRESSIVE LUMBAR LAMINECTOMY LEVEL 1 N/A 10/28/2013   Procedure: DECOMPRESSION L4-5;  Surgeon: Venita Lickahari Brooks, MD;  Location: [facility] OR;  Service: Orthopedics;  Laterality: N/A;  . KNEE ARTHROSCOPY  12/2008   left knee  . PARTIAL HYSTERECTOMY  10/1994  . thumb surgery  03/1975   Current Outpatient Medications on File Prior to Visit  Medication Sig Dispense Refill  . atorvastatin (LIPITOR) 20 MG tablet Take 1 tablet (20 mg total) by mouth daily. 90 tablet 1  . Capsaicin-Menthol (SALONPAS GEL EX) Apply topically.    . cetirizine (ZYRTEC) 10 MG tablet Take 10 mg by mouth 2 (two) times daily.    . cyclobenzaprine (FLEXERIL) 10 MG tablet Take 10 mg by mouth 3 (three) times daily as needed for muscle spasms.    Marland Kitchen diltiazem (CARDIZEM CD) 120 MG 24 hr capsule TAKE 1 CAPSULE BY MOUTH  EVERY DAY 30 capsule 5  . hydrochlorothiazide (MICROZIDE) 12.5 MG capsule Take 1 capsule (12.5 mg total) by mouth daily. 30 capsule 3  . levETIRAcetam (KEPPRA) 750 MG tablet Take 1 tablet (750 mg total) by mouth 2 (two) times daily. (Patient taking differently: Take 750 mg by mouth daily. ) 180 tablet 3  . Lysine 500 MG CAPS Take by mouth.    . temazepam (RESTORIL) 15 MG capsule Take 1 capsule (15 mg total) by mouth at bedtime as needed for sleep. 90 capsule 1   Current Facility-Administered Medications on File Prior to Visit  Medication Dose Route Frequency Provider Last Rate Last Dose  . gadopentetate dimeglumine (MAGNEVIST) injection 20 mL  20 mL Intravenous Once PRN Sater, Pearletha Furl, MD       Allergies  Allergen Reactions  . Gabapentin     Dizziness  . Loratadine Rash    Rash and hives   . Penicillins Hives and Rash   Social History   Socioeconomic History  . Marital status: Single    Spouse name: Not on file  . Number of children: 0  . Years of education: Not on file  . Highest education level: Not on file  Occupational History  . Occupation: Radiographer, therapeutic  Social Needs  . Financial resource strain: Not on file  . Food insecurity:    Worry: Not on file    Inability: Not on file  . Transportation needs:    Medical: Not on file    Non-medical: Not on file  Tobacco Use  . Smoking status: Never Smoker  . Smokeless tobacco: Never Used  Substance and Sexual Activity  . Alcohol use: No  . Drug use: No  . Sexual activity: Not on file  Lifestyle  . Physical activity:    Days per week: Not on file    Minutes per session: Not on file  . Stress: Not on file  Relationships  . Social connections:    Talks on phone: Not on file    Gets together: Not on file    Attends religious service: Not on file    Active member of club or organization: Not on file    Attends meetings of clubs or organizations: Not on file    Relationship status: Not on file  . Intimate  partner violence:    Fear of current or ex partner: Not on file    Emotionally abused: Not on file    Physically abused: Not on file    Forced sexual activity: Not on file  Other Topics Concern  . Not on file  Social History Narrative  . Not on file   Family History  Problem Relation Age of Onset  . Stroke Father   . Diabetes Other   .  Hyperlipidemia Other   . Heart disease Other   . Hypertension Other   . Cancer Other   . Breast cancer Neg Hx       Review of Systems  All other systems reviewed and are negative.      Objective:   Physical Exam Vitals signs reviewed.  Constitutional:      General: She is not in acute distress.    Appearance: Normal appearance. She is obese. She is not ill-appearing, toxic-appearing or diaphoretic.  HENT:     Head: Normocephalic and atraumatic.  Neck:     Musculoskeletal: Normal range of motion and neck supple.     Vascular: No carotid bruit.  Cardiovascular:     Rate and Rhythm: Normal rate and regular rhythm.     Pulses: Normal pulses.     Heart sounds: Normal heart sounds. No murmur. No friction rub. No gallop.   Pulmonary:     Effort: Pulmonary effort is normal. No respiratory distress.     Breath sounds: Normal breath sounds. No stridor. No wheezing, rhonchi or rales.  Chest:     Chest wall: No tenderness.  Abdominal:     General: Bowel sounds are normal. There is no distension.     Palpations: Abdomen is soft.  Musculoskeletal:     Right lower leg: No edema.     Left lower leg: No edema.  Lymphadenopathy:     Cervical: No cervical adenopathy.  Neurological:     Mental Status: She is alert.           Assessment & Plan:  Benign essential HTN - Plan: BASIC METABOLIC PANEL WITH GFR  Blood pressure is much better.  Continue HCTZ.  Recheck CMP and fasting lipid panel in 3 months.  Monitor potassium at that lab draw to ensure that she is not developing hypokalemia due to the HCTZ

## 2018-05-29 DIAGNOSIS — I10 Essential (primary) hypertension: Secondary | ICD-10-CM | POA: Diagnosis not present

## 2018-05-29 DIAGNOSIS — M1711 Unilateral primary osteoarthritis, right knee: Secondary | ICD-10-CM | POA: Diagnosis not present

## 2018-06-02 DIAGNOSIS — M1711 Unilateral primary osteoarthritis, right knee: Secondary | ICD-10-CM | POA: Diagnosis not present

## 2018-06-02 DIAGNOSIS — Z6841 Body Mass Index (BMI) 40.0 and over, adult: Secondary | ICD-10-CM | POA: Diagnosis not present

## 2018-06-19 ENCOUNTER — Ambulatory Visit: Payer: 59 | Admitting: Dietician

## 2018-06-30 DIAGNOSIS — M25561 Pain in right knee: Secondary | ICD-10-CM | POA: Diagnosis not present

## 2018-06-30 DIAGNOSIS — M1711 Unilateral primary osteoarthritis, right knee: Secondary | ICD-10-CM | POA: Diagnosis not present

## 2018-06-30 DIAGNOSIS — Z6841 Body Mass Index (BMI) 40.0 and over, adult: Secondary | ICD-10-CM | POA: Diagnosis not present

## 2018-07-11 ENCOUNTER — Other Ambulatory Visit: Payer: Self-pay | Admitting: *Deleted

## 2018-07-11 DIAGNOSIS — R001 Bradycardia, unspecified: Secondary | ICD-10-CM

## 2018-07-11 MED ORDER — DILTIAZEM HCL ER COATED BEADS 120 MG PO CP24: ORAL_CAPSULE | ORAL | 5 refills | Status: DC

## 2018-07-23 ENCOUNTER — Other Ambulatory Visit: Payer: Self-pay | Admitting: Family Medicine

## 2018-07-23 DIAGNOSIS — I10 Essential (primary) hypertension: Secondary | ICD-10-CM

## 2018-07-23 DIAGNOSIS — E785 Hyperlipidemia, unspecified: Secondary | ICD-10-CM

## 2018-07-24 ENCOUNTER — Other Ambulatory Visit: Payer: Self-pay

## 2018-07-24 ENCOUNTER — Other Ambulatory Visit: Payer: 59

## 2018-07-24 DIAGNOSIS — E785 Hyperlipidemia, unspecified: Secondary | ICD-10-CM

## 2018-07-24 DIAGNOSIS — I10 Essential (primary) hypertension: Secondary | ICD-10-CM

## 2018-07-25 ENCOUNTER — Other Ambulatory Visit: Payer: 59

## 2018-07-25 LAB — CBC WITH DIFFERENTIAL/PLATELET
ABSOLUTE MONOCYTES: 531 {cells}/uL (ref 200–950)
Basophils Absolute: 62 cells/uL (ref 0–200)
Basophils Relative: 0.9 %
Eosinophils Absolute: 317 cells/uL (ref 15–500)
Eosinophils Relative: 4.6 %
HCT: 41.3 % (ref 35.0–45.0)
Hemoglobin: 14.1 g/dL (ref 11.7–15.5)
Lymphs Abs: 1849 cells/uL (ref 850–3900)
MCH: 31.3 pg (ref 27.0–33.0)
MCHC: 34.1 g/dL (ref 32.0–36.0)
MCV: 91.6 fL (ref 80.0–100.0)
MPV: 12.1 fL (ref 7.5–12.5)
Monocytes Relative: 7.7 %
Neutro Abs: 4140 cells/uL (ref 1500–7800)
Neutrophils Relative %: 60 %
Platelets: 304 10*3/uL (ref 140–400)
RBC: 4.51 10*6/uL (ref 3.80–5.10)
RDW: 11.9 % (ref 11.0–15.0)
Total Lymphocyte: 26.8 %
WBC: 6.9 10*3/uL (ref 3.8–10.8)

## 2018-07-25 LAB — COMPREHENSIVE METABOLIC PANEL
AG Ratio: 2 (calc) (ref 1.0–2.5)
ALT: 21 U/L (ref 6–29)
AST: 26 U/L (ref 10–35)
Albumin: 4.3 g/dL (ref 3.6–5.1)
Alkaline phosphatase (APISO): 104 U/L (ref 37–153)
BUN: 17 mg/dL (ref 7–25)
CHLORIDE: 108 mmol/L (ref 98–110)
CO2: 27 mmol/L (ref 20–32)
Calcium: 9.5 mg/dL (ref 8.6–10.4)
Creat: 0.97 mg/dL (ref 0.50–1.05)
Globulin: 2.1 g/dL (calc) (ref 1.9–3.7)
Glucose, Bld: 79 mg/dL (ref 65–99)
Potassium: 4.3 mmol/L (ref 3.5–5.3)
Sodium: 146 mmol/L (ref 135–146)
Total Bilirubin: 0.8 mg/dL (ref 0.2–1.2)
Total Protein: 6.4 g/dL (ref 6.1–8.1)

## 2018-07-25 LAB — LIPID PANEL
Cholesterol: 134 mg/dL (ref <200)
HDL: 44 mg/dL — AB (ref >=50)
LDL Cholesterol (Calc): 73 mg/dL (calc)
Non-HDL Cholesterol (Calc): 90 mg/dL (calc) (ref <130)
Total CHOL/HDL Ratio: 3 (calc) (ref <5.0)
Triglycerides: 83 mg/dL (ref <150)

## 2018-08-01 DIAGNOSIS — M1711 Unilateral primary osteoarthritis, right knee: Secondary | ICD-10-CM | POA: Diagnosis not present

## 2018-08-01 DIAGNOSIS — M25561 Pain in right knee: Secondary | ICD-10-CM | POA: Diagnosis not present

## 2018-08-01 DIAGNOSIS — M25562 Pain in left knee: Secondary | ICD-10-CM | POA: Diagnosis not present

## 2018-08-04 ENCOUNTER — Other Ambulatory Visit: Payer: Self-pay | Admitting: Family Medicine

## 2018-08-04 DIAGNOSIS — M1711 Unilateral primary osteoarthritis, right knee: Secondary | ICD-10-CM | POA: Diagnosis not present

## 2018-08-04 DIAGNOSIS — M545 Low back pain: Secondary | ICD-10-CM | POA: Diagnosis not present

## 2018-08-04 DIAGNOSIS — Z6841 Body Mass Index (BMI) 40.0 and over, adult: Secondary | ICD-10-CM | POA: Diagnosis not present

## 2018-09-01 DIAGNOSIS — M25561 Pain in right knee: Secondary | ICD-10-CM | POA: Diagnosis not present

## 2018-09-01 DIAGNOSIS — M1711 Unilateral primary osteoarthritis, right knee: Secondary | ICD-10-CM | POA: Diagnosis not present

## 2018-09-01 DIAGNOSIS — M25562 Pain in left knee: Secondary | ICD-10-CM | POA: Diagnosis not present

## 2018-09-02 ENCOUNTER — Telehealth: Payer: Self-pay | Admitting: *Deleted

## 2018-09-02 NOTE — Telephone Encounter (Signed)
Called pt. Scheduled doxy.me appt for her appt tomorrow at 830am with Dr. Epimenio Foot. Email: [email or web address] Asked her to call back today if she does not receive. I updated medication list, pharmacy, allergies on file.

## 2018-09-03 ENCOUNTER — Ambulatory Visit (INDEPENDENT_AMBULATORY_CARE_PROVIDER_SITE_OTHER): Payer: 59 | Admitting: Neurology

## 2018-09-03 ENCOUNTER — Other Ambulatory Visit: Payer: Self-pay

## 2018-09-03 ENCOUNTER — Encounter: Payer: Self-pay | Admitting: Neurology

## 2018-09-03 DIAGNOSIS — G4733 Obstructive sleep apnea (adult) (pediatric): Secondary | ICD-10-CM | POA: Diagnosis not present

## 2018-09-03 DIAGNOSIS — R51 Headache: Secondary | ICD-10-CM | POA: Diagnosis not present

## 2018-09-03 DIAGNOSIS — G4700 Insomnia, unspecified: Secondary | ICD-10-CM

## 2018-09-03 DIAGNOSIS — R519 Headache, unspecified: Secondary | ICD-10-CM

## 2018-09-03 MED ORDER — TEMAZEPAM 15 MG PO CAPS: 15.0000 mg | ORAL_CAPSULE | Freq: Every evening | ORAL | 1 refills | Status: DC | PRN

## 2018-09-03 MED ORDER — LEVETIRACETAM 750 MG PO TABS: 750.0000 mg | ORAL_TABLET | Freq: Two times a day (BID) | ORAL | 3 refills | Status: DC
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
## 2018-10-14 ENCOUNTER — Other Ambulatory Visit: Payer: Self-pay | Admitting: Family Medicine

## 2018-11-25 DIAGNOSIS — Z0271 Encounter for disability determination: Secondary | ICD-10-CM

## 2018-12-14 ENCOUNTER — Encounter: Payer: Self-pay | Admitting: Family Medicine

## 2018-12-16 ENCOUNTER — Ambulatory Visit: Payer: Self-pay | Admitting: Orthopedic Surgery

## 2018-12-18 ENCOUNTER — Encounter (HOSPITAL_COMMUNITY): Payer: Self-pay

## 2018-12-18 NOTE — Patient Instructions (Addendum)
DUE TO COVID-19 ONLY ONE VISITOR IS ALLOWED TO COME WITH YOU AND STAY IN THE WAITING ROOM ONLY DURING PRE OP AND PROCEDURE. THE ONE VISITOR MAY VISIT WITH YOU IN YOUR PRIVATE ROOM DURING VISITOR HOURS ONLY!!   COVID SWAB TESTING MUST BE COMPLETED ON: Saturday, Aug. 22, 2020 at 10:25AM.  [address], [place] [facility] (Must self quarantine after testing. Follow instructions on handout.)             Your procedure is scheduled on: Wednesday, Aug. 26, 2020   [facility]   Report to Short Stay at 5:30 AM   Call this number if you have problems the morning of surgery [phone]   Do not eat food:After Midnight.   May have liquids until 4:30AM day of surgery   CLEAR LIQUID DIET  Foods Allowed                                                                     Foods Excluded  Water, Black Coffee and tea, regular and decaf                             liquids that you cannot  Plain Jell-O in any flavor  (No red)                                           see through such as: Fruit ices (not with fruit pulp)                                     milk, soups, orange juice  Iced Popsicles (No red)                                    All solid food Carbonated beverages, regular and diet                                    Apple juices Sports drinks like Gatorade (No red) Lightly seasoned clear broth or consume(fat free) Sugar, honey syrup  Sample Menu Breakfast                                Lunch                                     Supper Cranberry juice                    Beef broth                            Chicken broth Jell-O  Grape juice                           Apple juice Coffee or tea                        Jell-O                                      Popsicle                                                Coffee or tea                        Coffee or  tea   Complete one Ensure drink the morning of surgery at 4:30AM the day of surgery.   Brush your teeth the morning of surgery.   Do NOT smoke after Midnight   Take these medicines the morning of surgery with A SIP OF WATER: Atorvastatin, Cetirizine, Diltiazem                               You may not have any metal on your body including hair pins, jewelry, and body piercings             Do not wear make-up, lotions, powders, perfumes/cologne, or deodorant             Do not wear nail polish.  Do not shave  48 hours prior to surgery.                Do not bring valuables to the hospital. [place].   Contacts, dentures or bridgework may not be worn into surgery.   Bring small overnight bag day of surgery.    Special Instructions: Bring a copy of your healthcare power of attorney and living will documents         the day of surgery if you haven't scanned them in before.              Please read over the following fact sheets you were given:  [facility] - Preparing for Surgery Before surgery, you can play an important role.  Because skin is not sterile, your skin needs to be as free of germs as possible.  You can reduce the number of germs on your skin by washing with CHG (chlorahexidine gluconate) soap before surgery.  CHG is an antiseptic cleaner which kills germs and bonds with the skin to continue killing germs even after washing. Please DO NOT use if you have an allergy to CHG or antibacterial soaps.  If your skin becomes reddened/irritated stop using the CHG and inform your nurse when you arrive at Short Stay. Do not shave (including legs and underarms) for at least 48 hours prior to the first CHG shower.  You may shave your face/neck.  Please follow these instructions carefully:  1.  Shower with CHG Soap the night before surgery and the  morning of surgery.  2.  If you choose to  wash your hair, wash your hair first as usual with  your normal  shampoo.  3.  After you shampoo, rinse your hair and body thoroughly to remove the shampoo.                             4.  Use CHG as you would any other liquid soap.  You can apply chg directly to the skin and wash.  Gently with a scrungie or clean washcloth.  5.  Apply the CHG Soap to your body ONLY FROM THE NECK DOWN.   Do   not use on face/ open                           Wound or open sores. Avoid contact with eyes, ears mouth and   genitals (private parts).                       Wash face,  Genitals (private parts) with your normal soap.             6.  Wash thoroughly, paying special attention to the area where your    surgery  will be performed.  7.  Thoroughly rinse your body with warm water from the neck down.  8.  DO NOT shower/wash with your normal soap after using and rinsing off the CHG Soap.                9.  Pat yourself dry with a clean towel.            10.  Wear clean pajamas.            11.  Place clean sheets on your bed the night of your first shower and do not  sleep with pets. Day of Surgery : Do not apply any lotions/deodorants the morning of surgery.  Please wear clean clothes to the hospital/surgery center.  FAILURE TO FOLLOW THESE INSTRUCTIONS MAY RESULT IN THE CANCELLATION OF YOUR SURGERY  PATIENT SIGNATURE_________________________________  NURSE SIGNATURE__________________________________  ________________________________________________________________________   [name] [name]  An incentive spirometer is a tool that can help keep your lungs clear and active. This tool measures how well you are filling your lungs with each breath. Taking long deep breaths may help reverse or decrease the chance of developing breathing (pulmonary) problems (especially infection) following:  A long period of time when you are unable to move or be active. BEFORE THE PROCEDURE   If the spirometer includes an indicator to show your best effort, your nurse or  respiratory therapist will set it to a desired goal.  If possible, sit up straight or lean slightly forward. Try not to slouch.  Hold the incentive spirometer in an upright position. INSTRUCTIONS FOR USE  1. Sit on the edge of your bed if possible, or sit up as far as you can in bed or on a chair. 2. Hold the incentive spirometer in an upright position. 3. Breathe out normally. 4. Place the mouthpiece in your mouth and seal your lips tightly around it. 5. Breathe in slowly and as deeply as possible, raising the piston or the ball toward the top of the column. 6. Hold your breath for 3-5 seconds or for as long as possible. Allow the piston or ball to fall to the bottom of the column. 7. Remove the mouthpiece from your mouth and breathe out normally.  8. Rest for a few seconds and repeat Steps 1 through 7 at least 10 times every 1-2 hours when you are awake. Take your time and take a few normal breaths between deep breaths. 9. The spirometer may include an indicator to show your best effort. Use the indicator as a goal to work toward during each repetition. 10. After each set of 10 deep breaths, practice coughing to be sure your lungs are clear. If you have an incision (the cut made at the time of surgery), support your incision when coughing by placing a pillow or rolled up towels firmly against it. Once you are able to get out of bed, walk around indoors and cough well. You may stop using the incentive spirometer when instructed by your caregiver.  RISKS AND COMPLICATIONS  Take your time so you do not get dizzy or light-headed.  If you are in pain, you may need to take or ask for pain medication before doing incentive spirometry. It is harder to take a deep breath if you are having pain. AFTER USE  Rest and breathe slowly and easily.  It can be helpful to keep track of a log of your progress. Your caregiver can provide you with a simple table to help with this. If you are using the  spirometer at home, follow these instructions: [place] IF:   You are having difficultly using the spirometer.  You have trouble using the spirometer as often as instructed.  Your pain medication is not giving enough relief while using the spirometer.  You develop fever of 100.5 F (38.1 C) or higher. SEEK IMMEDIATE MEDICAL CARE IF:   You cough up bloody sputum that had not been present before.  You develop fever of 102 F (38.9 C) or greater.  You develop worsening pain at or near the incision site. MAKE SURE YOU:   Understand these instructions.  Will watch your condition.  Will get help right away if you are not doing well or get worse. Document Released: 08/27/2006 Document Revised: 07/09/2011 Document Reviewed: 10/28/2006 ExitCare Patient Information 2014 [employer], [place].   ________________________________________________________________________  WHAT IS A BLOOD TRANSFUSION? Blood Transfusion Information  A transfusion is the replacement of blood or some of its parts. Blood is made up of multiple cells which provide different functions.  Red blood cells carry oxygen and are used for blood loss replacement.  White blood cells fight against infection.  Platelets control bleeding.  Plasma helps clot blood.  Other blood products are available for specialized needs, such as hemophilia or other clotting disorders. BEFORE THE TRANSFUSION  Who gives blood for transfusions?   Healthy volunteers who are fully evaluated to make sure their blood is safe. This is blood bank blood. Transfusion therapy is the safest it has ever been in the practice of medicine. Before blood is taken from a donor, a complete history is taken to make sure that person has no history of diseases nor engages in risky social behavior (examples are intravenous drug use or sexual activity with multiple partners). The donor's travel history is screened to minimize risk of transmitting  infections, such as malaria. The donated blood is tested for signs of infectious diseases, such as HIV and hepatitis. The blood is then tested to be sure it is compatible with you in order to minimize the chance of a transfusion reaction. If you or a relative donates blood, this is often done in anticipation of surgery and is not appropriate for emergency situations. It  takes many days to process the donated blood. RISKS AND COMPLICATIONS Although transfusion therapy is very safe and saves many lives, the main dangers of transfusion include:   Getting an infectious disease.  Developing a transfusion reaction. This is an allergic reaction to something in the blood you were given. Every precaution is taken to prevent this. The decision to have a blood transfusion has been considered carefully by your caregiver before blood is given. Blood is not given unless the benefits outweigh the risks. AFTER THE TRANSFUSION  Right after receiving a blood transfusion, you will usually feel much better and more energetic. This is especially true if your red blood cells have gotten low (anemic). The transfusion raises the level of the red blood cells which carry oxygen, and this usually causes an energy increase.  The nurse administering the transfusion will monitor you carefully for complications. HOME CARE INSTRUCTIONS  No special instructions are needed after a transfusion. You may find your energy is better. Speak with your caregiver about any limitations on activity for underlying diseases you may have. SEEK MEDICAL CARE IF:   Your condition is not improving after your transfusion.  You develop redness or irritation at the intravenous (IV) site. SEEK IMMEDIATE MEDICAL CARE IF:  Any of the following symptoms occur over the next 12 hours:  Shaking chills.  You have a temperature by mouth above 102 F (38.9 C), not controlled by medicine.  Chest, back, or muscle pain.  People around you feel you are  not acting correctly or are confused.  Shortness of breath or difficulty breathing.  Dizziness and fainting.  You get a rash or develop hives.  You have a decrease in urine output.  Your urine turns a dark color or changes to pink, red, or brown. Any of the following symptoms occur over the next 10 days:  You have a temperature by mouth above 102 F (38.9 C), not controlled by medicine.  Shortness of breath.  Weakness after normal activity.  The white part of the eye turns yellow (jaundice).  You have a decrease in the amount of urine or are urinating less often.  Your urine turns a dark color or changes to pink, red, or brown. Document Released: 04/13/2000 Document Revised: 07/09/2011 Document Reviewed: 12/01/2007 [facility] Patient Information 2014 [place], [place].  _______________________________________________________________________

## 2018-12-19 ENCOUNTER — Ambulatory Visit: Payer: Self-pay | Admitting: Orthopedic Surgery

## 2018-12-19 ENCOUNTER — Other Ambulatory Visit: Payer: Self-pay

## 2018-12-19 ENCOUNTER — Encounter (HOSPITAL_COMMUNITY)
Admission: RE | Admit: 2018-12-19 | Discharge: 2018-12-19 | Disposition: A | Discharge status: Home / Self Care | Payer: 59 | Source: Ambulatory Visit | Attending: Orthopedic Surgery | Admitting: Orthopedic Surgery

## 2018-12-19 ENCOUNTER — Encounter (HOSPITAL_COMMUNITY): Payer: Self-pay

## 2018-12-19 DIAGNOSIS — Z01818 Encounter for other preprocedural examination: Secondary | ICD-10-CM | POA: Diagnosis present

## 2018-12-19 DIAGNOSIS — Z87442 Personal history of urinary calculi: Secondary | ICD-10-CM | POA: Diagnosis not present

## 2018-12-19 DIAGNOSIS — I4510 Unspecified right bundle-branch block: Secondary | ICD-10-CM | POA: Insufficient documentation

## 2018-12-19 DIAGNOSIS — G709 Myoneural disorder, unspecified: Secondary | ICD-10-CM | POA: Diagnosis not present

## 2018-12-19 DIAGNOSIS — Z20828 Contact with and (suspected) exposure to other viral communicable diseases: Secondary | ICD-10-CM | POA: Insufficient documentation

## 2018-12-19 DIAGNOSIS — M1711 Unilateral primary osteoarthritis, right knee: Secondary | ICD-10-CM | POA: Diagnosis not present

## 2018-12-19 DIAGNOSIS — Z79899 Other long term (current) drug therapy: Secondary | ICD-10-CM | POA: Insufficient documentation

## 2018-12-19 DIAGNOSIS — I10 Essential (primary) hypertension: Secondary | ICD-10-CM | POA: Diagnosis not present

## 2018-12-19 HISTORY — DX: Personal history of other diseases of the nervous system and sense organs: Z86.69

## 2018-12-19 LAB — CBC
HCT: 42.6 % (ref 36.0–46.0)
Hemoglobin: 13.3 g/dL (ref 12.0–15.0)
MCH: 29 pg (ref 26.0–34.0)
MCHC: 31.2 g/dL (ref 30.0–36.0)
MCV: 92.8 fL (ref 80.0–100.0)
Platelets: 284 10*3/uL (ref 150–400)
RBC: 4.59 MIL/uL (ref 3.87–5.11)
RDW: 13 % (ref 11.5–15.5)
WBC: 9.5 10*3/uL (ref 4.0–10.5)
nRBC: 0 % (ref 0.0–0.2)

## 2018-12-19 LAB — URINALYSIS, ROUTINE W REFLEX MICROSCOPIC
Bilirubin Urine: NEGATIVE
Glucose, UA: NEGATIVE mg/dL
Hgb urine dipstick: NEGATIVE
Ketones, ur: NEGATIVE mg/dL
Nitrite: NEGATIVE
Protein, ur: NEGATIVE mg/dL
Specific Gravity, Urine: 1.008 (ref 1.005–1.030)
pH: 6 (ref 5.0–8.0)

## 2018-12-19 LAB — COMPREHENSIVE METABOLIC PANEL
ALT: 20 U/L (ref 0–44)
AST: 21 U/L (ref 15–41)
Albumin: 4.1 g/dL (ref 3.5–5.0)
Alkaline Phosphatase: 109 U/L (ref 38–126)
Anion gap: 8 (ref 5–15)
BUN: 15 mg/dL (ref 6–20)
CO2: 26 mmol/L (ref 22–32)
Calcium: 8.9 mg/dL (ref 8.9–10.3)
Chloride: 108 mmol/L (ref 98–111)
Creatinine, Ser: 0.97 mg/dL (ref 0.44–1.00)
GFR calc Af Amer: >60 mL/min (ref >60)
GFR calc non Af Amer: >60 mL/min (ref >60)
Glucose, Bld: 103 mg/dL — ABNORMAL HIGH (ref 70–99)
Potassium: 3.8 mmol/L (ref 3.5–5.1)
Sodium: 142 mmol/L (ref 135–145)
Total Bilirubin: 0.7 mg/dL (ref 0.3–1.2)
Total Protein: 7 g/dL (ref 6.5–8.1)

## 2018-12-19 LAB — SURGICAL PCR SCREEN
MRSA, PCR: NEGATIVE
Staphylococcus aureus: NEGATIVE

## 2018-12-19 LAB — PROTIME-INR
INR: 0.8 (ref 0.8–1.2)
Prothrombin Time: 11.4 seconds (ref 11.4–15.2)

## 2018-12-19 NOTE — Progress Notes (Signed)
SPOKE W/  Marlowe Kays     SCREENING SYMPTOMS OF COVID 19:   COUGH--NO  RUNNY NOSE--- NO  SORE THROAT---NO  NASAL CONGESTION----NO  SNEEZING----NO  SHORTNESS OF BREATH---NO  DIFFICULTY BREATHING---NO  TEMP >100.0 -----NO  UNEXPLAINED BODY ACHES------NO  CHILLS -------- NO  HEADACHES ---------NO  LOSS OF SMELL/ TASTE --------NO    HAVE YOU OR ANY FAMILY MEMBER TRAVELLED PAST 14 DAYS OUT OF THE   COUNTY---NO STATE----NO COUNTRY----NO  HAVE YOU OR ANY FAMILY MEMBER BEEN EXPOSED TO ANYONE WITH COVID 19? NO

## 2018-12-19 NOTE — H&P (View-Only) (Signed)
TOTAL KNEE ADMISSION H&P  Patient is being admitted for right total knee arthroplasty.  Subjective:  Chief Complaint:right knee pain.  HPI: [name] [name], 58 y.o. female, has a history of pain and functional disability in the right knee due to arthritis and has failed non-surgical conservative treatments for greater than 12 weeks to includeNSAID's and/or analgesics, corticosteriod injections, viscosupplementation injections, flexibility and strengthening excercises, use of assistive devices, weight reduction as appropriate and activity modification.  Onset of symptoms was gradual, starting 4 years ago with rapidlly worsening course since that time. The patient noted prior procedures on the knee to include  arthroscopy and menisectomy on the right knee(s).  Patient currently rates pain in the right knee(s) at 10 out of 10 with activity. Patient has night pain, worsening of pain with activity and weight bearing, pain that interferes with activities of daily living, pain with passive range of motion, crepitus and joint swelling.  Patient has evidence of subchondral cysts, subchondral sclerosis, periarticular osteophytes and joint space narrowing by imaging studies.There is no active infection.  Patient Active Problem List   Diagnosis Date Noted  . Abnormal brain MRI 07/02/2017  . Insomnia 02/26/2017  . Vestibular disorder 01/24/2017  . Headache disorder 01/24/2017  . Vitamin D deficiency 08/02/2015  . HTN (hypertension) 05/25/2015  . Syncope 02/17/2015  . Back pain 10/28/2013  . Obstructive sleep apnea 09/18/2013  . Morbid obesity-BMI 52 07/10/2013   Past Medical History:  Diagnosis Date  . Anxiety 2008   seen in ED /w chest pain but told that she was having anxiety  . Arthritis   . History of kidney stones 1999  . History of migraine   . History of stress test 2010   done due to chest pain /w Dr. Ganji. Pt. told that it was wnl.  . Hives of unknown origin    told by PCP ( Lupton) to  use Zrytec BID to control hives   . Hypertension   . Neuromuscular disorder (HCC)    back pain  . Sleep difficulties    has had a sleep study - t old that she didn't meet criteria for machine   . Syncope    due to pain    Past Surgical History:  Procedure Laterality Date  . COLONOSCOPY  2016  . DECOMPRESSIVE LUMBAR LAMINECTOMY LEVEL 1 N/A 10/28/2013   Procedure: DECOMPRESSION L4-5;  Surgeon: Dahari Brooks, MD;  Location: [facility] OR;  Service: Orthopedics;  Laterality: N/A;  . KNEE ARTHROSCOPY  12/2008   left knee  . KNEE ARTHROSCOPY Right 01/27/2018  . PARTIAL HYSTERECTOMY  10/1994  . thumb surgery Right 03/1975    Current Outpatient Medications  Medication Sig Dispense Refill Last Dose  . atorvastatin (LIPITOR) 20 MG tablet TAKE 1 TABLET BY MOUTH EVERY DAY (Patient taking differently: Take 20 mg by mouth daily. ) 90 tablet 1   . cetirizine (ZYRTEC) 10 MG tablet Take 10 mg by mouth daily.    Taking  . diltiazem (CARDIZEM CD) 120 MG 24 hr capsule TAKE 1 CAPSULE BY MOUTH EVERY DAY (Patient taking differently: Take 120 mg by mouth daily. ) 30 capsule 5 Taking  . hydrochlorothiazide (MICROZIDE) 12.5 MG capsule TAKE 1 CAPSULE BY MOUTH EVERY DAY (Patient taking differently: Take 12.5 mg by mouth daily. ) 90 capsule 1 Taking  . ibuprofen (ADVIL) 200 MG tablet Take 400 mg by mouth every 8 (eight) hours as needed for moderate pain.     . levETIRAcetam (KEPPRA) 750 MG tablet Take   1 tablet (750 mg total) by mouth 2 (two) times daily. (Patient not taking: Reported on 12/16/2018) 180 tablet 3 Not Taking at Unknown time  . temazepam (RESTORIL) 15 MG capsule Take 1 capsule (15 mg total) by mouth at bedtime as needed for sleep. (Patient not taking: Reported on 12/16/2018) 90 capsule 1 Not Taking at Unknown time   No current facility-administered medications for this visit.    Facility-Administered Medications Ordered in Other Visits  Medication Dose Route Frequency Provider Last Rate Last Dose  .  gadopentetate dimeglumine (MAGNEVIST) injection 20 mL  20 mL Intravenous Once PRN Sater, Nanine Means, MD       Allergies  Allergen Reactions  . Gabapentin     Dizziness  . Other     American cockroaches  . Loratadine Hives and Rash  . Penicillins Hives and Rash    Did it involve swelling of the face/tongue/throat, SOB, or low BP? No Did it involve sudden or severe rash/hives, skin peeling, or any reaction on the inside of your mouth or nose? No Did you need to seek medical attention at a hospital or doctor's office? Yes When did it last happen?Childhood allergy If all above answers are "NO", may proceed with cephalosporin use.     Social History   Tobacco Use  . Smoking status: Never Smoker  . Smokeless tobacco: Never Used  Substance Use Topics  . Alcohol use: Not Currently    Comment: rare    Family History  Problem Relation Age of Onset  . Stroke Father   . Diabetes Other   . Hyperlipidemia Other   . Heart disease Other   . Hypertension Other   . Cancer Other   . Breast cancer Neg Hx      Review of Systems  Constitutional: Negative.   HENT: Negative.   Eyes: Negative.   Respiratory: Negative.   Cardiovascular: Negative.   Gastrointestinal: Negative.   Genitourinary: Negative.   Musculoskeletal: Positive for back pain and joint pain.  Skin: Negative.   Neurological: Positive for headaches.  Endo/Heme/Allergies: Negative.   Psychiatric/Behavioral: The patient is nervous/anxious.     Objective:  Physical Exam  Vitals reviewed. Constitutional: She is oriented to person, place, and time. She appears well-developed and well-nourished.  HENT:  Head: Normocephalic and atraumatic.  Eyes: Pupils are equal, round, and reactive to light. Conjunctivae and EOM are normal.  Neck: Normal range of motion. Neck supple.  Cardiovascular: Normal rate, regular rhythm and intact distal pulses.  Respiratory: Effort normal. No respiratory distress.  GI: Soft. She exhibits  no distension.  Genitourinary:    Genitourinary Comments: deferred   Musculoskeletal:     Right knee: She exhibits decreased range of motion, swelling, effusion, abnormal alignment and bony tenderness. Tenderness found. Medial joint line and lateral joint line tenderness noted.  Neurological: She is alert and oriented to person, place, and time. She has normal reflexes.  Skin: Skin is warm and dry.  Psychiatric: She has a normal mood and affect. Her behavior is normal. Judgment and thought content normal.    Vital signs in last 24 hours: @VSRANGES @  Labs:   Estimated body mass index is 43.83 kg/m as calculated from the following:   Height as of an earlier encounter on 12/19/18: 5' (1.524 m).   Weight as of an earlier encounter on 12/19/18: 101.8 kg.   Imaging Review Plain radiographs demonstrate severe degenerative joint disease of the right knee(s). The overall alignment issignificant varus. The bone quality appears to  be adequate for age and reported activity level.      Assessment/Plan:  End stage arthritis, right knee   The patient history, physical examination, clinical judgment of the provider and imaging studies are consistent with end stage degenerative joint disease of the right knee(s) and total knee arthroplasty is deemed medically necessary. The treatment options including medical management, injection therapy arthroscopy and arthroplasty were discussed at length. The risks and benefits of total knee arthroplasty were presented and reviewed. The risks due to aseptic loosening, infection, stiffness, patella tracking problems, thromboembolic complications and other imponderables were discussed. The patient acknowledged the explanation, agreed to proceed with the plan and consent was signed. Patient is being admitted for inpatient treatment for surgery, pain control, PT, OT, prophylactic antibiotics, VTE prophylaxis, progressive ambulation and ADL's and discharge planning. The  patient is planning to be discharged home with OPPT     Patient's anticipated LOS is less than 2 midnights, meeting these requirements: - Younger than 6365 - Lives within 1 hour of care - Has a competent adult at home to recover with post-op recover - NO history of  - Chronic pain requiring opiods  - Diabetes  - Coronary Artery Disease  - Heart failure  - Heart attack  - Stroke  - DVT/VTE  - Cardiac arrhythmia  - Respiratory Failure/COPD  - Renal failure  - Anemia  - Advanced Liver disease

## 2018-12-19 NOTE — H&P (Signed)
TOTAL KNEE ADMISSION H&P  Patient is being admitted for right total knee arthroplasty.  Subjective:  Chief Complaint:right knee pain.  HPI: [name], 58 y.o. female, has a history of pain and functional disability in the right knee due to arthritis and has failed non-surgical conservative treatments for greater than 12 weeks to includeNSAID's and/or analgesics, corticosteriod injections, viscosupplementation injections, flexibility and strengthening excercises, use of assistive devices, weight reduction as appropriate and activity modification.  Onset of symptoms was gradual, starting 4 years ago with rapidlly worsening course since that time. The patient noted prior procedures on the knee to include  arthroscopy and menisectomy on the right knee(s).  Patient currently rates pain in the right knee(s) at 10 out of 10 with activity. Patient has night pain, worsening of pain with activity and weight bearing, pain that interferes with activities of daily living, pain with passive range of motion, crepitus and joint swelling.  Patient has evidence of subchondral cysts, subchondral sclerosis, periarticular osteophytes and joint space narrowing by imaging studies.There is no active infection.  Patient Active Problem List   Diagnosis Date Noted  . Abnormal brain MRI 07/02/2017  . Insomnia 02/26/2017  . Vestibular disorder 01/24/2017  . Headache disorder 01/24/2017  . Vitamin D deficiency 08/02/2015  . HTN (hypertension) 05/25/2015  . Syncope 02/17/2015  . Back pain 10/28/2013  . Obstructive sleep apnea 09/18/2013  . Morbid obesity-BMI 52 07/10/2013   Past Medical History:  Diagnosis Date  . Anxiety 2008   seen in ED /w chest pain but told that she was having anxiety  . Arthritis   . History of kidney stones 1999  . History of migraine   . History of stress test 2010   done due to chest pain /w Dr. Jacinto HalimGanji. Pt. told that it was wnl.  . Hives of unknown origin    told by PCP Terri Piedra( [place]) to  use Zrytec BID to control hives   . Hypertension   . Neuromuscular disorder (HCC)    back pain  . Sleep difficulties    has had a sleep study - t old that she didn't meet criteria for machine   . Syncope    due to pain    Past Surgical History:  Procedure Laterality Date  . COLONOSCOPY  2016  . DECOMPRESSIVE LUMBAR LAMINECTOMY LEVEL 1 N/A 10/28/2013   Procedure: DECOMPRESSION L4-5;  Surgeon: Venita Lickahari Brooks, MD;  Location: [facility] OR;  Service: Orthopedics;  Laterality: N/A;  . KNEE ARTHROSCOPY  12/2008   left knee  . KNEE ARTHROSCOPY Right 01/27/2018  . PARTIAL HYSTERECTOMY  10/1994  . thumb surgery Right 03/1975    Current Outpatient Medications  Medication Sig Dispense Refill Last Dose  . atorvastatin (LIPITOR) 20 MG tablet TAKE 1 TABLET BY MOUTH EVERY DAY (Patient taking differently: Take 20 mg by mouth daily. ) 90 tablet 1   . cetirizine (ZYRTEC) 10 MG tablet Take 10 mg by mouth daily.    Taking  . diltiazem (CARDIZEM CD) 120 MG 24 hr capsule TAKE 1 CAPSULE BY MOUTH EVERY DAY (Patient taking differently: Take 120 mg by mouth daily. ) 30 capsule 5 Taking  . hydrochlorothiazide (MICROZIDE) 12.5 MG capsule TAKE 1 CAPSULE BY MOUTH EVERY DAY (Patient taking differently: Take 12.5 mg by mouth daily. ) 90 capsule 1 Taking  . ibuprofen (ADVIL) 200 MG tablet Take 400 mg by mouth every 8 (eight) hours as needed for moderate pain.     Marland Kitchen. levETIRAcetam (KEPPRA) 750 MG tablet Take  1 tablet (750 mg total) by mouth 2 (two) times daily. (Patient not taking: Reported on 12/16/2018) 180 tablet 3 Not Taking at Unknown time  . temazepam (RESTORIL) 15 MG capsule Take 1 capsule (15 mg total) by mouth at bedtime as needed for sleep. (Patient not taking: Reported on 12/16/2018) 90 capsule 1 Not Taking at Unknown time   No current facility-administered medications for this visit.    Facility-Administered Medications Ordered in Other Visits  Medication Dose Route Frequency Provider Last Rate Last Dose  .  gadopentetate dimeglumine (MAGNEVIST) injection 20 mL  20 mL Intravenous Once PRN Sater, Nanine Means, MD       Allergies  Allergen Reactions  . Gabapentin     Dizziness  . Other     American cockroaches  . Loratadine Hives and Rash  . Penicillins Hives and Rash    Did it involve swelling of the face/tongue/throat, SOB, or low BP? No Did it involve sudden or severe rash/hives, skin peeling, or any reaction on the inside of your mouth or nose? No Did you need to seek medical attention at a hospital or doctor's office? Yes When did it last happen?Childhood allergy If all above answers are "NO", may proceed with cephalosporin use.     Social History   Tobacco Use  . Smoking status: Never Smoker  . Smokeless tobacco: Never Used  Substance Use Topics  . Alcohol use: Not Currently    Comment: rare    Family History  Problem Relation Age of Onset  . Stroke Father   . Diabetes Other   . Hyperlipidemia Other   . Heart disease Other   . Hypertension Other   . Cancer Other   . Breast cancer Neg Hx      Review of Systems  Constitutional: Negative.   HENT: Negative.   Eyes: Negative.   Respiratory: Negative.   Cardiovascular: Negative.   Gastrointestinal: Negative.   Genitourinary: Negative.   Musculoskeletal: Positive for back pain and joint pain.  Skin: Negative.   Neurological: Positive for headaches.  Endo/Heme/Allergies: Negative.   Psychiatric/Behavioral: The patient is nervous/anxious.     Objective:  Physical Exam  Vitals reviewed. Constitutional: She is oriented to person, place, and time. She appears well-developed and well-nourished.  HENT:  Head: Normocephalic and atraumatic.  Eyes: Pupils are equal, round, and reactive to light. Conjunctivae and EOM are normal.  Neck: Normal range of motion. Neck supple.  Cardiovascular: Normal rate, regular rhythm and intact distal pulses.  Respiratory: Effort normal. No respiratory distress.  GI: Soft. She exhibits  no distension.  Genitourinary:    Genitourinary Comments: deferred   Musculoskeletal:     Right knee: She exhibits decreased range of motion, swelling, effusion, abnormal alignment and bony tenderness. Tenderness found. Medial joint line and lateral joint line tenderness noted.  Neurological: She is alert and oriented to person, place, and time. She has normal reflexes.  Skin: Skin is warm and dry.  Psychiatric: She has a normal mood and affect. Her behavior is normal. Judgment and thought content normal.    Vital signs in last 24 hours: @VSRANGES @  Labs:   Estimated body mass index is 43.83 kg/m as calculated from the following:   Height as of an earlier encounter on 12/19/18: 5' (1.524 m).   Weight as of an earlier encounter on 12/19/18: 101.8 kg.   Imaging Review Plain radiographs demonstrate severe degenerative joint disease of the right knee(s). The overall alignment issignificant varus. The bone quality appears to  be adequate for age and reported activity level.      Assessment/Plan:  End stage arthritis, right knee   The patient history, physical examination, clinical judgment of the provider and imaging studies are consistent with end stage degenerative joint disease of the right knee(s) and total knee arthroplasty is deemed medically necessary. The treatment options including medical management, injection therapy arthroscopy and arthroplasty were discussed at length. The risks and benefits of total knee arthroplasty were presented and reviewed. The risks due to aseptic loosening, infection, stiffness, patella tracking problems, thromboembolic complications and other imponderables were discussed. The patient acknowledged the explanation, agreed to proceed with the plan and consent was signed. Patient is being admitted for inpatient treatment for surgery, pain control, PT, OT, prophylactic antibiotics, VTE prophylaxis, progressive ambulation and ADL's and discharge planning. The  patient is planning to be discharged home with OPPT     Patient's anticipated LOS is less than 2 midnights, meeting these requirements: - Younger than 6365 - Lives within 1 hour of care - Has a competent adult at home to recover with post-op recover - NO history of  - Chronic pain requiring opiods  - Diabetes  - Coronary Artery Disease  - Heart failure  - Heart attack  - Stroke  - DVT/VTE  - Cardiac arrhythmia  - Respiratory Failure/COPD  - Renal failure  - Anemia  - Advanced Liver disease

## 2018-12-20 ENCOUNTER — Other Ambulatory Visit (HOSPITAL_COMMUNITY)
Admission: RE | Admit: 2018-12-20 | Discharge: 2018-12-20 | Disposition: A | Discharge status: Home / Self Care | Payer: 59 | Source: Ambulatory Visit | Attending: Orthopedic Surgery | Admitting: Orthopedic Surgery

## 2018-12-20 DIAGNOSIS — Z01818 Encounter for other preprocedural examination: Secondary | ICD-10-CM | POA: Diagnosis not present

## 2018-12-20 LAB — ABO/RH: ABO/RH(D): B POS

## 2018-12-20 LAB — SARS CORONAVIRUS 2 (TAT 6-24 HRS): SARS Coronavirus 2: NEGATIVE

## 2018-12-23 NOTE — Progress Notes (Signed)
Anesthesia Chart Review   Case: [id number] Date/Time: 12/24/18 0715   Procedure: COMPUTER ASSISTED TOTAL KNEE ARTHROPLASTY (Right )   Anesthesia type: Spinal   Pre-op diagnosis: Degenerative joint disease right knee   Location: [facility] ROOM 04 / [facility]   Surgeon: Rod Can, MD      DISCUSSION:58 y.o. never smoker with h/o HTN, migraines, right knee DJD scheduled for above procedure 12/24/2018 with Dr. Rod Can.   Anticipate pt can proceed with planned procedure barring acute status change.   VS: BP (!) 141/62   Pulse 62   Temp 36.9 C (Oral)   Resp 16   Ht 5' (1.524 m)   Wt 101.8 kg   SpO2 98%   BMI 43.83 kg/m   PROVIDERS: Susy Frizzle, MD is PCP last seen 05/20/2018, stable at this visit.    LABS: Labs reviewed: Acceptable for surgery. (all labs ordered are listed, but only abnormal results are displayed)  Labs Reviewed  COMPREHENSIVE METABOLIC PANEL - Abnormal; Notable for the following components:      Result Value   Glucose, Bld 103 (*)    All other components within normal limits  URINALYSIS, ROUTINE W REFLEX MICROSCOPIC - Abnormal; Notable for the following components:   Leukocytes,Ua TRACE (*)    Bacteria, UA RARE (*)    All other components within normal limits  SURGICAL PCR SCREEN  CBC  PROTIME-INR  TYPE AND SCREEN  ABO/RH     IMAGES:   EKG: 12/19/2018 Rate 62 bpm Normal sinus rhythm  Incomplete right bundle branch block  Borderline ECG  CV: Echo 04/08/2015 Study Conclusions  - Left ventricle: The cavity size was normal. There was mild focal   basal hypertrophy of the septum. Systolic function was vigorous.   The estimated ejection fraction was in the range of 65% to 70%.   Wall motion was normal; there were no regional wall motion   abnormalities. Features are consistent with a pseudonormal left   ventricular filling pattern, with concomitant abnormal relaxation   and increased filling pressure (grade 2 diastolic dysfunction). -  Left atrium: The atrium was mildly dilated.  Lexiscan 02/17/2015 Conclusion:  1. Resting EKG NSR, Stress EKG was non diagnostic for ischemia.  No ST-T changes of ischemia noted with pharmacologic stress testing.  Stress symptoms included diaphoresis and weakness.  Resting BP was 152/98 and peak BP was 138/80 mm Hg.  BP response was normal during stress procedure.  2. The perfusion study demonstrated mild gut uptake artifact in the inferior and infero-apical segment.  A small sized apical lateral infarct cannot be excluded.  This does not reach statistical significance by polar plot imaging.  Dynamic gated images reveal normal wall motion and endocardial thickening.  Left ventricular EF was estimated to be 68%.  3. This represents a low risk study. Clinical correlation is recommended.  Past Medical History:  Diagnosis Date  . Anxiety 2008   seen in ED /w chest pain but told that she was having anxiety  . Arthritis   . History of kidney stones 1999  . History of migraine   . History of stress test 2010   done due to chest pain /w Dr. Einar Gip. Pt. told that it was wnl.  . Hives of unknown origin    told by PCP Allyson Sabal) to use Zrytec BID to control hives   . Hypertension   . Neuromuscular disorder ([place])    back pain  . Sleep difficulties    has had a  sleep study - t old that she didn't meet criteria for machine   . Syncope    due to pain    Past Surgical History:  Procedure Laterality Date  . COLONOSCOPY  2016  . DECOMPRESSIVE LUMBAR LAMINECTOMY LEVEL 1 N/A 10/28/2013   Procedure: DECOMPRESSION L4-5;  Surgeon: Venita Lickahari Brooks, MD;  Location: [facility];  Service: Orthopedics;  Laterality: N/A;  . KNEE ARTHROSCOPY  12/2008   left knee  . KNEE ARTHROSCOPY Right 01/27/2018  . PARTIAL HYSTERECTOMY  10/1994  . thumb surgery Right 03/1975    MEDICATIONS: . atorvastatin (LIPITOR) 20 MG tablet  . cetirizine (ZYRTEC) 10 MG tablet  . diltiazem (CARDIZEM CD) 120 MG 24 hr capsule  .  hydrochlorothiazide (MICROZIDE) 12.5 MG capsule  . ibuprofen (ADVIL) 200 MG tablet  . levETIRAcetam (KEPPRA) 750 MG tablet  . temazepam (RESTORIL) 15 MG capsule   No current facility-administered medications for this encounter.    . gadopentetate dimeglumine (MAGNEVIST) injection 20 mL   Janey GentaJessica Kandas Oliveto, PA-C [facility] [phone] 12/23/18  10:32 AM

## 2018-12-23 NOTE — Anesthesia Preprocedure Evaluation (Addendum)
Anesthesia Evaluation  Patient identified by MRN, date of birth, ID band Patient awake    Reviewed: Allergy & Precautions, H&P , NPO status , Patient's Chart, lab work & pertinent test results  History of Anesthesia Complications Negative for: history of anesthetic complications  Airway Mallampati: II  TM Distance: >3 FB Neck ROM: Full    Dental  (+) Teeth Intact, Dental Advisory Given   Pulmonary neg pulmonary ROS,    Pulmonary exam normal breath sounds clear to auscultation       Cardiovascular hypertension, On Medications  Rhythm:Regular Rate:Normal     Neuro/Psych Anxiety  Neuromuscular disease negative neurological ROS  negative psych ROS   GI/Hepatic negative GI ROS, Neg liver ROS,   Endo/Other  Morbid obesity  Renal/GU negative Renal ROS  negative genitourinary   Musculoskeletal   Abdominal (+) + obese,   Peds  Hematology negative hematology ROS (+)   Anesthesia Other Findings   Reproductive/Obstetrics negative OB ROS                             Anesthesia Physical  Anesthesia Plan  ASA: III  Anesthesia Plan: Spinal   Post-op Pain Management:  Regional for Post-op pain   Induction: Intravenous  PONV Risk Score and Plan: 3 and Ondansetron, Dexamethasone, Propofol infusion and Treatment may vary due to age or medical condition  Airway Management Planned:   Additional Equipment: None  Intra-op Plan:   Post-operative Plan:   Informed Consent: I have reviewed the patients History and Physical, chart, labs and discussed the procedure including the risks, benefits and alternatives for the proposed anesthesia with the patient or authorized representative who has indicated his/her understanding and acceptance.     Dental advisory given  Plan Discussed with: CRNA  Anesthesia Plan Comments:        Anesthesia Quick Evaluation

## 2018-12-24 ENCOUNTER — Inpatient Hospital Stay (HOSPITAL_COMMUNITY): Payer: 59 | Admitting: Physician Assistant

## 2018-12-24 ENCOUNTER — Inpatient Hospital Stay (HOSPITAL_COMMUNITY): Payer: 59 | Admitting: Anesthesiology

## 2018-12-24 ENCOUNTER — Encounter (HOSPITAL_COMMUNITY)
Admission: RE | Disposition: A | Discharge status: Home / Self Care | Payer: Self-pay | Source: Home / Self Care | Attending: Orthopedic Surgery

## 2018-12-24 ENCOUNTER — Encounter (HOSPITAL_COMMUNITY): Payer: Self-pay | Admitting: *Deleted

## 2018-12-24 ENCOUNTER — Observation Stay (HOSPITAL_COMMUNITY): Payer: 59

## 2018-12-24 ENCOUNTER — Observation Stay (HOSPITAL_COMMUNITY)
Admission: RE | Admit: 2018-12-24 | Discharge: 2018-12-25 | Disposition: A | Discharge status: Home / Self Care | Payer: 59 | Attending: Orthopedic Surgery | Admitting: Orthopedic Surgery

## 2018-12-24 ENCOUNTER — Other Ambulatory Visit: Payer: Self-pay

## 2018-12-24 DIAGNOSIS — G4733 Obstructive sleep apnea (adult) (pediatric): Secondary | ICD-10-CM | POA: Insufficient documentation

## 2018-12-24 DIAGNOSIS — Z6841 Body Mass Index (BMI) 40.0 and over, adult: Secondary | ICD-10-CM | POA: Diagnosis not present

## 2018-12-24 DIAGNOSIS — E876 Hypokalemia: Secondary | ICD-10-CM | POA: Diagnosis not present

## 2018-12-24 DIAGNOSIS — I10 Essential (primary) hypertension: Secondary | ICD-10-CM | POA: Diagnosis not present

## 2018-12-24 DIAGNOSIS — M1711 Unilateral primary osteoarthritis, right knee: Secondary | ICD-10-CM | POA: Diagnosis not present

## 2018-12-24 DIAGNOSIS — M25561 Pain in right knee: Secondary | ICD-10-CM | POA: Diagnosis present

## 2018-12-24 DIAGNOSIS — Z79899 Other long term (current) drug therapy: Secondary | ICD-10-CM | POA: Diagnosis not present

## 2018-12-24 DIAGNOSIS — I160 Hypertensive urgency: Secondary | ICD-10-CM | POA: Insufficient documentation

## 2018-12-24 DIAGNOSIS — Z96651 Presence of right artificial knee joint: Secondary | ICD-10-CM

## 2018-12-24 HISTORY — PX: KNEE ARTHROPLASTY: SHX992

## 2018-12-24 LAB — TYPE AND SCREEN
ABO/RH(D): B POS
Antibody Screen: NEGATIVE

## 2018-12-24 SURGERY — ARTHROPLASTY, KNEE, TOTAL, USING IMAGELESS COMPUTER-ASSISTED NAVIGATION
Anesthesia: Spinal | Site: Knee | Laterality: Right

## 2018-12-24 MED ORDER — SODIUM CHLORIDE 0.9 % IV SOLN
INTRAVENOUS | Status: DC
  Administered 2018-12-24 – 2018-12-25 (×3): via INTRAVENOUS

## 2018-12-24 MED ORDER — PROPOFOL 10 MG/ML IV BOLUS
INTRAVENOUS | Status: AC
  Filled 2018-12-24: qty 20

## 2018-12-24 MED ORDER — SODIUM CHLORIDE 0.9 % IV SOLN
INTRAVENOUS | Status: DC | PRN
  Administered 2018-12-24: 20 ug/min via INTRAVENOUS

## 2018-12-24 MED ORDER — DEXAMETHASONE SODIUM PHOSPHATE 10 MG/ML IJ SOLN
INTRAMUSCULAR | Status: AC
  Filled 2018-12-24: qty 1

## 2018-12-24 MED ORDER — PROPOFOL 10 MG/ML IV BOLUS
INTRAVENOUS | Status: DC | PRN
  Administered 2018-12-24: 20 mg via INTRAVENOUS
  Administered 2018-12-24: 30 mg via INTRAVENOUS
  Administered 2018-12-24: 20 mg via INTRAVENOUS

## 2018-12-24 MED ORDER — SODIUM CHLORIDE (PF) 0.9 % IJ SOLN
INTRAMUSCULAR | Status: DC | PRN
  Administered 2018-12-24: 30 mL

## 2018-12-24 MED ORDER — LACTATED RINGERS IV SOLN
INTRAVENOUS | Status: DC
  Administered 2018-12-24 (×2): via INTRAVENOUS

## 2018-12-24 MED ORDER — METOCLOPRAMIDE HCL 5 MG PO TABS: 5.0000 mg | ORAL_TABLET | Freq: Three times a day (TID) | ORAL | Status: DC | PRN

## 2018-12-24 MED ORDER — HYDROCHLOROTHIAZIDE 12.5 MG PO CAPS
12.5000 mg | ORAL_CAPSULE | Freq: Every day | ORAL | Status: DC
  Administered 2018-12-25: 12.5 mg via ORAL
  Filled 2018-12-24: qty 1

## 2018-12-24 MED ORDER — ATORVASTATIN CALCIUM 20 MG PO TABS
20.0000 mg | ORAL_TABLET | Freq: Every day | ORAL | Status: DC
  Administered 2018-12-25: 20 mg via ORAL
  Filled 2018-12-24: qty 1

## 2018-12-24 MED ORDER — STERILE WATER FOR IRRIGATION IR SOLN
Status: DC | PRN
  Administered 2018-12-24 (×2): 1000 mL

## 2018-12-24 MED ORDER — LORATADINE 10 MG PO TABS: 10.0000 mg | ORAL_TABLET | Freq: Every day | ORAL | Status: DC

## 2018-12-24 MED ORDER — HYDROMORPHONE HCL 1 MG/ML IJ SOLN
INTRAMUSCULAR | Status: AC
  Filled 2018-12-24: qty 1

## 2018-12-24 MED ORDER — BUPIVACAINE IN DEXTROSE 0.75-8.25 % IT SOLN
INTRATHECAL | Status: DC | PRN
  Administered 2018-12-24: 2 mL via INTRATHECAL

## 2018-12-24 MED ORDER — HYDROCODONE-ACETAMINOPHEN 7.5-325 MG PO TABS
1.0000 | ORAL_TABLET | ORAL | Status: DC | PRN
  Administered 2018-12-24: 1 via ORAL
  Filled 2018-12-24: qty 1

## 2018-12-24 MED ORDER — METOCLOPRAMIDE HCL 5 MG/ML IJ SOLN: 5.0000 mg | Freq: Three times a day (TID) | INTRAMUSCULAR | Status: DC | PRN

## 2018-12-24 MED ORDER — TRANEXAMIC ACID-NACL 1000-0.7 MG/100ML-% IV SOLN
1000.0000 mg | INTRAVENOUS | Status: AC
  Administered 2018-12-24: 1000 mg via INTRAVENOUS
  Filled 2018-12-24: qty 100

## 2018-12-24 MED ORDER — HYDROMORPHONE HCL 1 MG/ML IJ SOLN
0.2500 mg | INTRAMUSCULAR | Status: DC | PRN
  Administered 2018-12-24 (×2): 0.5 mg via INTRAVENOUS

## 2018-12-24 MED ORDER — CELECOXIB 200 MG PO CAPS
200.0000 mg | ORAL_CAPSULE | Freq: Two times a day (BID) | ORAL | Status: DC
  Administered 2018-12-24 – 2018-12-25 (×2): 200 mg via ORAL
  Filled 2018-12-24 (×2): qty 1

## 2018-12-24 MED ORDER — MORPHINE SULFATE (PF) 2 MG/ML IV SOLN: 0.5000 mg | INTRAVENOUS | Status: DC | PRN

## 2018-12-24 MED ORDER — DOCUSATE SODIUM 100 MG PO CAPS
100.0000 mg | ORAL_CAPSULE | Freq: Two times a day (BID) | ORAL | Status: DC
  Administered 2018-12-24 – 2018-12-25 (×2): 100 mg via ORAL
  Filled 2018-12-24 (×2): qty 1

## 2018-12-24 MED ORDER — BUPIVACAINE-EPINEPHRINE (PF) 0.25% -1:200000 IJ SOLN
INTRAMUSCULAR | Status: AC
  Filled 2018-12-24: qty 30

## 2018-12-24 MED ORDER — ASPIRIN 81 MG PO CHEW
81.0000 mg | CHEWABLE_TABLET | Freq: Two times a day (BID) | ORAL | Status: DC
  Administered 2018-12-24 – 2018-12-25 (×2): 81 mg via ORAL
  Filled 2018-12-24 (×2): qty 1

## 2018-12-24 MED ORDER — SODIUM CHLORIDE 0.9 % IR SOLN
Status: DC | PRN
  Administered 2018-12-24: 1000 mL

## 2018-12-24 MED ORDER — ISOPROPYL ALCOHOL 70 % SOLN
Status: AC
  Filled 2018-12-24: qty 480

## 2018-12-24 MED ORDER — FENTANYL CITRATE (PF) 100 MCG/2ML IJ SOLN
INTRAMUSCULAR | Status: DC | PRN
  Administered 2018-12-24: 25 ug via INTRAVENOUS
  Administered 2018-12-24: 50 ug via INTRAVENOUS
  Administered 2018-12-24: 25 ug via INTRAVENOUS

## 2018-12-24 MED ORDER — ISOPROPYL ALCOHOL 70 % SOLN
Status: DC | PRN
  Administered 2018-12-24: 1 via TOPICAL

## 2018-12-24 MED ORDER — FENTANYL CITRATE (PF) 100 MCG/2ML IJ SOLN
INTRAMUSCULAR | Status: AC
  Filled 2018-12-24: qty 2

## 2018-12-24 MED ORDER — CLINDAMYCIN PHOSPHATE 900 MG/50ML IV SOLN
900.0000 mg | INTRAVENOUS | Status: AC
  Administered 2018-12-24: 900 mg via INTRAVENOUS
  Filled 2018-12-24: qty 50

## 2018-12-24 MED ORDER — PROPOFOL 10 MG/ML IV BOLUS
INTRAVENOUS | Status: AC
  Filled 2018-12-24: qty 80

## 2018-12-24 MED ORDER — DIPHENHYDRAMINE HCL 12.5 MG/5ML PO ELIX: 12.5000 mg | ORAL_SOLUTION | ORAL | Status: DC | PRN

## 2018-12-24 MED ORDER — SODIUM CHLORIDE 0.9% IV SOLUTION
INTRAVENOUS | Status: AC | PRN
  Administered 2018-12-24: 1000 mL via INTRAMUSCULAR

## 2018-12-24 MED ORDER — ACETAMINOPHEN 325 MG PO TABS: 325.0000 mg | ORAL_TABLET | Freq: Four times a day (QID) | ORAL | Status: DC | PRN

## 2018-12-24 MED ORDER — HYDROCODONE-ACETAMINOPHEN 5-325 MG PO TABS
1.0000 | ORAL_TABLET | ORAL | Status: DC | PRN
  Administered 2018-12-24: 1 via ORAL
  Administered 2018-12-24 – 2018-12-25 (×4): 2 via ORAL
  Filled 2018-12-24 (×4): qty 2
  Filled 2018-12-24: qty 1

## 2018-12-24 MED ORDER — MIDAZOLAM HCL 2 MG/2ML IJ SOLN
INTRAMUSCULAR | Status: AC
  Filled 2018-12-24: qty 2

## 2018-12-24 MED ORDER — ONDANSETRON HCL 4 MG PO TABS: 4.0000 mg | ORAL_TABLET | Freq: Four times a day (QID) | ORAL | Status: DC | PRN

## 2018-12-24 MED ORDER — DILTIAZEM HCL ER COATED BEADS 120 MG PO CP24
120.0000 mg | ORAL_CAPSULE | Freq: Every day | ORAL | Status: DC
  Administered 2018-12-25: 120 mg via ORAL
  Filled 2018-12-24: qty 1

## 2018-12-24 MED ORDER — ACETAMINOPHEN 10 MG/ML IV SOLN
1000.0000 mg | INTRAVENOUS | Status: AC
  Administered 2018-12-24: 1000 mg via INTRAVENOUS
  Filled 2018-12-24: qty 100

## 2018-12-24 MED ORDER — MENTHOL 3 MG MT LOZG: 1.0000 | LOZENGE | OROMUCOSAL | Status: DC | PRN

## 2018-12-24 MED ORDER — PROPOFOL 500 MG/50ML IV EMUL
INTRAVENOUS | Status: DC | PRN
  Administered 2018-12-24: 135 ug/kg/min via INTRAVENOUS

## 2018-12-24 MED ORDER — ALUM & MAG HYDROXIDE-SIMETH 200-200-20 MG/5ML PO SUSP: 30.0000 mL | ORAL | Status: DC | PRN

## 2018-12-24 MED ORDER — PHENOL 1.4 % MT LIQD: 1.0000 | OROMUCOSAL | Status: DC | PRN

## 2018-12-24 MED ORDER — PROMETHAZINE HCL 25 MG/ML IJ SOLN: 6.2500 mg | INTRAMUSCULAR | Status: DC | PRN

## 2018-12-24 MED ORDER — DEXAMETHASONE SODIUM PHOSPHATE 10 MG/ML IJ SOLN
10.0000 mg | Freq: Once | INTRAMUSCULAR | Status: AC
  Administered 2018-12-25: 10 mg via INTRAVENOUS
  Filled 2018-12-24: qty 1

## 2018-12-24 MED ORDER — SENNA 8.6 MG PO TABS
1.0000 | ORAL_TABLET | Freq: Two times a day (BID) | ORAL | Status: DC
  Administered 2018-12-24 – 2018-12-25 (×3): 8.6 mg via ORAL
  Filled 2018-12-24 (×3): qty 1

## 2018-12-24 MED ORDER — BUPIVACAINE-EPINEPHRINE 0.25% -1:200000 IJ SOLN
INTRAMUSCULAR | Status: DC | PRN
  Administered 2018-12-24: 30 mL

## 2018-12-24 MED ORDER — SODIUM CHLORIDE 0.9 % IV SOLN: INTRAVENOUS | Status: DC

## 2018-12-24 MED ORDER — METHOCARBAMOL 500 MG PO TABS
500.0000 mg | ORAL_TABLET | Freq: Four times a day (QID) | ORAL | Status: DC | PRN
  Administered 2018-12-25 (×2): 500 mg via ORAL
  Filled 2018-12-24 (×2): qty 1

## 2018-12-24 MED ORDER — SODIUM CHLORIDE (PF) 0.9 % IJ SOLN
INTRAMUSCULAR | Status: AC
  Filled 2018-12-24: qty 50

## 2018-12-24 MED ORDER — SODIUM CHLORIDE 0.9 % IR SOLN
Status: DC | PRN
  Administered 2018-12-24: 3000 mL

## 2018-12-24 MED ORDER — CHLORHEXIDINE GLUCONATE 4 % EX LIQD: 60.0000 mL | Freq: Once | CUTANEOUS | Status: DC

## 2018-12-24 MED ORDER — EPHEDRINE 5 MG/ML INJ
INTRAVENOUS | Status: AC
  Filled 2018-12-24: qty 10

## 2018-12-24 MED ORDER — METHOCARBAMOL 500 MG IVPB - SIMPLE MED
INTRAVENOUS | Status: AC
  Filled 2018-12-24: qty 50

## 2018-12-24 MED ORDER — POVIDONE-IODINE 10 % EX SWAB
2.0000 "application " | Freq: Once | CUTANEOUS | Status: AC
  Administered 2018-12-24: 2 via TOPICAL

## 2018-12-24 MED ORDER — ONDANSETRON HCL 4 MG/2ML IJ SOLN
INTRAMUSCULAR | Status: AC
  Filled 2018-12-24: qty 2

## 2018-12-24 MED ORDER — ONDANSETRON HCL 4 MG/2ML IJ SOLN: 4.0000 mg | Freq: Four times a day (QID) | INTRAMUSCULAR | Status: DC | PRN

## 2018-12-24 MED ORDER — KETOROLAC TROMETHAMINE 30 MG/ML IJ SOLN
INTRAMUSCULAR | Status: AC
  Filled 2018-12-24: qty 1

## 2018-12-24 MED ORDER — PHENYLEPHRINE HCL (PRESSORS) 10 MG/ML IV SOLN
INTRAVENOUS | Status: AC
  Filled 2018-12-24: qty 1

## 2018-12-24 MED ORDER — MIDAZOLAM HCL 2 MG/2ML IJ SOLN
INTRAMUSCULAR | Status: DC | PRN
  Administered 2018-12-24: 1 mg via INTRAVENOUS

## 2018-12-24 MED ORDER — POLYETHYLENE GLYCOL 3350 17 G PO PACK: 17.0000 g | PACK | Freq: Every day | ORAL | Status: DC | PRN

## 2018-12-24 MED ORDER — DEXAMETHASONE SODIUM PHOSPHATE 10 MG/ML IJ SOLN
INTRAMUSCULAR | Status: DC | PRN
  Administered 2018-12-24: 8 mg via INTRAVENOUS

## 2018-12-24 MED ORDER — EPHEDRINE SULFATE-NACL 50-0.9 MG/10ML-% IV SOSY
PREFILLED_SYRINGE | INTRAVENOUS | Status: DC | PRN
  Administered 2018-12-24 (×2): 10 mg via INTRAVENOUS

## 2018-12-24 MED ORDER — CLINDAMYCIN PHOSPHATE 600 MG/50ML IV SOLN
600.0000 mg | Freq: Four times a day (QID) | INTRAVENOUS | Status: AC
  Administered 2018-12-24 (×2): 600 mg via INTRAVENOUS
  Filled 2018-12-24 (×2): qty 50

## 2018-12-24 MED ORDER — POVIDONE-IODINE 10 % EX SWAB: 2.0000 "application " | Freq: Once | CUTANEOUS | Status: DC

## 2018-12-24 MED ORDER — KETOROLAC TROMETHAMINE 30 MG/ML IJ SOLN
INTRAMUSCULAR | Status: DC | PRN
  Administered 2018-12-24: 30 mg

## 2018-12-24 MED ORDER — ONDANSETRON HCL 4 MG/2ML IJ SOLN
INTRAMUSCULAR | Status: DC | PRN
  Administered 2018-12-24: 4 mg via INTRAVENOUS

## 2018-12-24 MED ORDER — METHOCARBAMOL 500 MG IVPB - SIMPLE MED
500.0000 mg | Freq: Four times a day (QID) | INTRAVENOUS | Status: DC | PRN
  Administered 2018-12-24: 500 mg via INTRAVENOUS
  Filled 2018-12-24: qty 50

## 2018-12-24 MED ORDER — MEPERIDINE HCL 50 MG/ML IJ SOLN: 6.2500 mg | INTRAMUSCULAR | Status: DC | PRN

## 2018-12-24 SURGICAL SUPPLY — 74 items
ADH SKN CLS APL DERMABOND .7 (GAUZE/BANDAGES/DRESSINGS) ×2
APL PRP STRL LF DISP 70% ISPRP (MISCELLANEOUS) ×2
BAG SPEC THK2 15X12 ZIP CLS (MISCELLANEOUS)
BAG ZIPLOCK 12X15 (MISCELLANEOUS) IMPLANT
BATTERY INSTRU NAVIGATION (MISCELLANEOUS) ×6 IMPLANT
BEARING TIBIA INSRT KNEE SZ4 9 (Insert) IMPLANT
BLADE SAW RECIPROCATING 77.5 (BLADE) ×2 IMPLANT
BNDG ELASTIC 4X5.8 VLCR STR LF (GAUZE/BANDAGES/DRESSINGS) ×2 IMPLANT
BNDG ELASTIC 6X5.8 VLCR STR LF (GAUZE/BANDAGES/DRESSINGS) ×2 IMPLANT
BSPLAT TIB 4 KN TRITANIUM (Knees) ×1 IMPLANT
BTRY SRG DRVR LF (MISCELLANEOUS) ×3
CHLORAPREP W/TINT 26 (MISCELLANEOUS) ×4 IMPLANT
COMPONENT TRI CR RETAIN KNEE (Orthopedic Implant) IMPLANT
COVER SURGICAL LIGHT HANDLE (MISCELLANEOUS) ×2 IMPLANT
COVER WAND RF STERILE (DRAPES) IMPLANT
CUFF TOURN SGL QUICK 34 (TOURNIQUET CUFF) ×2
CUFF TRNQT CYL 34X4.125X (TOURNIQUET CUFF) ×1 IMPLANT
DECANTER SPIKE VIAL GLASS SM (MISCELLANEOUS) ×4 IMPLANT
DERMABOND ADVANCED (GAUZE/BANDAGES/DRESSINGS) ×2
DERMABOND ADVANCED .7 DNX12 (GAUZE/BANDAGES/DRESSINGS) ×2 IMPLANT
DRAPE SHEET LG 3/4 BI-LAMINATE (DRAPES) ×6 IMPLANT
DRAPE U-SHAPE 47X51 STRL (DRAPES) ×2 IMPLANT
DRSG AQUACEL AG ADV 3.5X10 (GAUZE/BANDAGES/DRESSINGS) ×2 IMPLANT
DRSG TEGADERM 4X4.75 (GAUZE/BANDAGES/DRESSINGS) IMPLANT
ELECT BLADE TIP CTD 4 INCH (ELECTRODE) ×2 IMPLANT
ELECT REM PT RETURN 15FT ADLT (MISCELLANEOUS) ×2 IMPLANT
EVACUATOR 1/8 PVC DRAIN (DRAIN) IMPLANT
GAUZE SPONGE 4X4 12PLY STRL (GAUZE/BANDAGES/DRESSINGS) ×2 IMPLANT
GLOVE BIO SURGEON STRL SZ8.5 (GLOVE) ×4 IMPLANT
GLOVE BIOGEL PI IND STRL 8.5 (GLOVE) ×1 IMPLANT
GLOVE BIOGEL PI INDICATOR 8.5 (GLOVE) ×1
GOWN SPEC L3 XXLG W/TWL (GOWN DISPOSABLE) ×2 IMPLANT
HANDPIECE INTERPULSE COAX TIP (DISPOSABLE) ×2
HOLDER FOLEY CATH W/STRAP (MISCELLANEOUS) ×2 IMPLANT
HOOD PEEL AWAY FLYTE STAYCOOL (MISCELLANEOUS) ×6 IMPLANT
JET LAVAGE IRRISEPT WOUND (IRRIGATION / IRRIGATOR) ×2
KIT TURNOVER KIT A (KITS) IMPLANT
KNEE PATELLA ASYMMETRIC 10X32 (Knees) ×1 IMPLANT
KNEE TIBIAL COMP TRI SZ4 (Knees) ×1 IMPLANT
LAVAGE JET IRRISEPT WOUND (IRRIGATION / IRRIGATOR) ×1 IMPLANT
MARKER SKIN DUAL TIP RULER LAB (MISCELLANEOUS) ×2 IMPLANT
NDL SAFETY ECLIPSE 18X1.5 (NEEDLE) ×1 IMPLANT
NDL SPNL 18GX3.5 QUINCKE PK (NEEDLE) ×1 IMPLANT
NEEDLE HYPO 18GX1.5 SHARP (NEEDLE) ×2
NEEDLE SPNL 18GX3.5 QUINCKE PK (NEEDLE) ×2 IMPLANT
NS IRRIG 1000ML POUR BTL (IV SOLUTION) ×2 IMPLANT
PACK TOTAL KNEE CUSTOM (KITS) ×2 IMPLANT
PADDING CAST COTTON 6X4 STRL (CAST SUPPLIES) ×2 IMPLANT
PIN FLUTED HEDLESS FIX 3.5X1/8 (PIN) ×2 IMPLANT
PROTECTOR NERVE ULNAR (MISCELLANEOUS) ×2 IMPLANT
SAW OSC TIP CART 19.5X105X1.3 (SAW) ×2 IMPLANT
SEALER BIPOLAR AQUA 6.0 (INSTRUMENTS) ×2 IMPLANT
SET HNDPC FAN SPRY TIP SCT (DISPOSABLE) ×1 IMPLANT
SET PAD KNEE POSITIONER (MISCELLANEOUS) ×2 IMPLANT
SPONGE DRAIN TRACH 4X4 STRL 2S (GAUZE/BANDAGES/DRESSINGS) IMPLANT
SPONGE LAP 18X18 RF (DISPOSABLE) IMPLANT
SUT MNCRL AB 3-0 PS2 18 (SUTURE) ×2 IMPLANT
SUT MNCRL AB 4-0 PS2 18 (SUTURE) ×2 IMPLANT
SUT MON AB 2-0 CT1 36 (SUTURE) ×4 IMPLANT
SUT STRATAFIX PDO 1 14 VIOLET (SUTURE) ×2
SUT STRATFX PDO 1 14 VIOLET (SUTURE) ×1
SUT VIC AB 1 CTX 36 (SUTURE) ×4
SUT VIC AB 1 CTX36XBRD ANBCTR (SUTURE) ×2 IMPLANT
SUT VIC AB 2-0 CT1 27 (SUTURE) ×6
SUT VIC AB 2-0 CT1 TAPERPNT 27 (SUTURE) ×1 IMPLANT
SUTURE STRATFX PDO 1 14 VIOLET (SUTURE) ×1 IMPLANT
SYR 3ML LL SCALE MARK (SYRINGE) ×2 IMPLANT
TIBIA BEAR INSERT KNEE SZ4 9 (Insert) ×2 IMPLANT
TOWER CARTRIDGE SMART MIX (DISPOSABLE) IMPLANT
TRAY FOLEY MTR SLVR 16FR STAT (SET/KITS/TRAYS/PACK) IMPLANT
TRIA CRUCIATE RETAIN KNEE (Orthopedic Implant) ×2 IMPLANT
WATER STERILE IRR 1000ML POUR (IV SOLUTION) ×4 IMPLANT
WRAP KNEE MAXI GEL POST OP (GAUZE/BANDAGES/DRESSINGS) ×2 IMPLANT
YANKAUER SUCT BULB TIP 10FT TU (MISCELLANEOUS) ×2 IMPLANT

## 2018-12-24 NOTE — Discharge Instructions (Signed)
° °Dr. Suraiya Dickerson °Total Joint Specialist °[facility] °[address] °[name], [name] [address] °[phone] ° °TOTAL KNEE REPLACEMENT POSTOPERATIVE DIRECTIONS ° ° ° °Knee Rehabilitation, Guidelines Following Surgery  °Results after knee surgery are often greatly improved when you follow the exercise, range of motion and muscle strengthening exercises prescribed by your doctor. Safety measures are also important to protect the knee from further injury. Any time any of these exercises cause you to have increased pain or swelling in your knee joint, decrease the amount until you are comfortable again and slowly increase them. If you have problems or questions, call your caregiver or physical therapist for advice.  ° °WEIGHT BEARING °Weight bearing as tolerated with assist device (walker, cane, etc) as directed, use it as long as suggested by your surgeon or therapist, typically at least 4-6 weeks. ° °HOME CARE INSTRUCTIONS  °Remove items at home which could result in a fall. This includes throw rugs or furniture in walking pathways.  °Continue medications as instructed at time of discharge. °You may have some home medications which will be placed on hold until you complete the course of blood thinner medication.  °You may start showering once you are discharged home but do not submerge the incision under water. Just pat the incision dry and apply a dry gauze dressing on daily. °Walk with walker as instructed.  °You may resume a sexual relationship in one month or when given the OK by your doctor.  °· Use walker as long as suggested by your caregivers. °· Avoid periods of inactivity such as sitting longer than an hour when not asleep. This helps prevent blood clots.  °You may put full weight on your legs and walk as much as is comfortable.  °You may return to work once you are cleared by your doctor.  °Do not drive a car for 6 weeks or until released by you surgeon.  °· Do not drive  while taking narcotics.  °Wear the elastic stockings for three weeks following surgery during the day but you may remove then at night. °Make sure you keep all of your appointments after your operation with all of your doctors and caregivers. You should call the office at the above phone number and make an appointment for approximately two weeks after the date of your surgery. °Do not remove your surgical dressing. The dressing is waterproof; you may take showers in 3 days, but do not take tub baths or submerge the dressing. °Please pick up a stool softener and laxative for home use as long as you are requiring pain medications. °· ICE to the affected knee every three hours for 30 minutes at a time and then as needed for pain and swelling.  Continue to use ice on the knee for pain and swelling from surgery. You may notice swelling that will progress down to the foot and ankle.  This is normal after surgery.  Elevate the leg when you are not up walking on it.   °It is important for you to complete the blood thinner medication as prescribed by your doctor. °· Continue to use the breathing machine which will help keep your temperature down.  It is common for your temperature to cycle up and down following surgery, especially at night when you are not up moving around and exerting yourself.  The breathing machine keeps your lungs expanded and your temperature down. ° °RANGE OF MOTION AND STRENGTHENING EXERCISES  °Rehabilitation of the knee is important following   a knee injury or an operation. After just a few days of immobilization, the muscles of the thigh which control the knee become weakened and shrink (atrophy). Knee exercises are designed to build up the tone and strength of the thigh muscles and to improve knee motion. Often times heat used for twenty to thirty minutes before working out will loosen up your tissues and help with improving the range of motion but do not use heat for the first two weeks following  surgery. These exercises can be done on a training (exercise) mat, on the floor, on a table or on a bed. Use what ever works the best and is most comfortable for you Knee exercises include:  °Leg Lifts - While your knee is still immobilized in a splint or cast, you can do straight leg raises. Lift the leg to 60 degrees, hold for 3 sec, and slowly lower the leg. Repeat 10-20 times 2-3 times daily. Perform this exercise against resistance later as your knee gets better.  °Quad and Hamstring Sets - Tighten up the muscle on the front of the thigh (Quad) and hold for 5-10 sec. Repeat this 10-20 times hourly. Hamstring sets are done by pushing the foot backward against an object and holding for 5-10 sec. Repeat as with quad sets.  °A rehabilitation program following serious knee injuries can speed recovery and prevent re-injury in the future due to weakened muscles. Contact your doctor or a physical therapist for more information on knee rehabilitation.  ° °SKILLED REHAB INSTRUCTIONS: °If the patient is transferred to a skilled rehab facility following release from the hospital, a list of the current medications will be sent to the facility for the patient to continue.  When discharged from the skilled rehab facility, please have the facility set up the patient's Home Health Physical Therapy prior to being released. Also, the skilled facility will be responsible for providing the patient with their medications at time of release from the facility to include their pain medication, the muscle relaxants, and their blood thinner medication. If the patient is still at the rehab facility at time of the two week follow up appointment, the skilled rehab facility will also need to assist the patient in arranging follow up appointment in our office and any transportation needs. ° °MAKE SURE YOU:  °Understand these instructions.  °Will watch your condition.  °Will get help right away if you are not doing well or get worse.   ° ° °Pick up stool softner and laxative for home use following surgery while on pain medications. °Do NOT remove your dressing. You may shower.  °Do not take tub baths or submerge incision under water. °May shower starting three days after surgery. °Please use a clean towel to pat the incision dry following showers. °Continue to use ice for pain and swelling after surgery. °Do not use any lotions or creams on the incision until instructed by your surgeon. ° °

## 2018-12-24 NOTE — Op Note (Signed)
OPERATIVE REPORT  SURGEON: Rod Can, MD   ASSISTANT: Nehemiah Massed, PA-C.  PREOPERATIVE DIAGNOSIS: Right knee arthritis.   POSTOPERATIVE DIAGNOSIS: Right knee arthritis.   PROCEDURE: Right total knee arthroplasty.   IMPLANTS: Stryker Triathlon CR femur, size 3. Stryker Tritanium tibia, size 4. X3 polyethelyene insert, size 9 mm, CR. 3 button asymmetric patella, size 32 mm.  ANESTHESIA:  MAC, Regional and Spinal  TOURNIQUET TIME: Not utilized.   ESTIMATED BLOOD LOSS:-450 mL    ANTIBIOTICS: 900 mg clindamycin.  DRAINS: None.  COMPLICATIONS: None   CONDITION: PACU - hemodynamically stable.   BRIEF CLINICAL NOTE: [name] [name] is a 58 y.o. female with a long-standing history of Right knee arthritis. After failing conservative management, the patient was indicated for total knee arthroplasty. The risks, benefits, and alternatives to the procedure were explained, and the patient elected to proceed.  PROCEDURE IN DETAIL: Adductor canal block was obtained in the pre-op holding area. Once inside the operative room, spinal anesthesia was obtained, and a foley catheter was inserted. The patient was then positioned, a nonsterile tourniquet was placed, and the lower extremity was prepped and draped in the normal sterile surgical fashion.  A time-out was called verifying side and site of surgery. The patient received IV antibiotics within 60 minutes of beginning the procedure. The tourniquet was not utilized.   An anterior approach to the knee was performed utilizing a medial peripatellar arthrotomy. A medial release was performed and the patellar fat pad was excised. Stryker navigation was used to cut the distal femur perpendicular to the mechanical axis. A freehand patellar resection was performed, and the patella was sized an prepared with 3 lug holes.  Nagivation was used to make a  neutral proximal tibia resection, taking 6 mm of bone from the less affected lateral side with 3 degrees of slope. The menisci were excised. A spacer block was placed, and the alignment and balance in extension were confirmed.   The distal femur was sized using the 3-degree external rotation guide referencing the posterior femoral cortex. The appropriate 4-in-1 cutting block was pinned into place. Rotation was checked using Whiteside's line, the epicondylar axis, and then confirmed with a spacer block in flexion. The remaining femoral cuts were performed, taking care to protect the MCL.  The tibia was sized and the trial tray was pinned into place. The remaining trail components were inserted. The knee was stable to varus and valgus stress through a full range of motion. The patella tracked centrally, and the PCL was well balanced. The trial components were removed, and the proximal tibial surface was prepared. Final components were impacted into place. The knee was tested for a final time and found to be well balanced.  Patella tracked centrally with no thumbs technique.    The wound was copiously irrigated with Irrisept solution and normal saline using pule lavage.  Marcaine solution was injected into the periarticular soft tissue.  The wound was closed in layers using #1 Vicryl and Stratafix for the fascia, 2-0 Vicryl for the subcutaneous fat, 2-0 Monocryl for the deep dermal layer, 3-0 running Monocryl subcuticular Stitch, and 4-0 Monocryl stay sutures at both ends of the wound. Dermabond was applied to the skin.  Once the glue was fully dried, an Aquacell Ag and compressive dressing were applied.  Tthe patient was transported to the recovery room in stable condition.  Sponge, needle, and instrument counts were correct at the end of the case x2.  The patient tolerated the procedure well  and there were no known complications.  Please note that a surgical assistant was a medical necessity for this  procedure in order to perform it in a safe and expeditious manner. Surgical assistant was necessary to retract the ligaments and vital neurovascular structures to prevent injury to them and also necessary for proper positioning of the limb to allow for anatomic placement of the prosthesis.

## 2018-12-24 NOTE — Evaluation (Signed)
Physical Therapy Evaluation Patient Details Name: [name] [name] MRN: [id number] DOB: 08/29/1960 Today's Date: 12/24/2018   History of Present Illness  s/p R TKA. PMH: HTN, back surgery, morbid obesity, vestibular d/o  Clinical Impression  Pt is s/p TKA resulting in the deficits listed below (see PT Problem List).  Pt amb ~ 8' with Rw and min/guard assist, anticipate steady progress. Pt is very motivated to work with PT.  Pt reports having lost 64lbs in preparation for surgery.  Will continue to follow   Pt will benefit from skilled PT to increase their independence and safety with mobility to allow discharge to the venue listed below.      Follow Up Recommendations Follow surgeon's recommendation for DC plan and follow-up therapies    Equipment Recommendations  None recommended by PT    Recommendations for Other Services       Precautions / Restrictions Precautions Precautions: Knee;Fall Restrictions Weight Bearing Restrictions: No Other Position/Activity Restrictions: WBAT      Mobility  Bed Mobility Overal bed mobility: Needs Assistance Bed Mobility: Supine to Sit     Supine to sit: Min assist     General bed mobility comments: assist with RLE  Transfers Overall transfer level: Needs assistance Equipment used: Rolling walker (2 wheeled) Transfers: Sit to/from Stand Sit to Stand: Min guard;Min assist         General transfer comment: cues for hand placement  Ambulation/Gait Ambulation/Gait assistance: Min assist;Min guard Gait Distance (Feet): 8 Feet   Gait Pattern/deviations: Step-to pattern;Decreased stance time - right;Decreased weight shift to right     General Gait Details: cues for sequence and RW distance from self  Stairs            Wheelchair Mobility    Modified Rankin (Stroke Patients Only)       Balance                                             Pertinent Vitals/Pain Pain Assessment: 0-10 Pain Score: 2   Pain Location: right knee Pain Descriptors / Indicators: Discomfort Pain Intervention(s): Limited activity within patient's tolerance;Monitored during session;Premedicated before session;Repositioned    Home Living Family/patient expects to be discharged to:: Private residence Living Arrangements: Other relatives Available Help at Discharge: Friend(s) Type of Home: House Home Access: Stairs to enter   Technical brewer of Steps: 1 and 1 Home Layout: One level Home Equipment: Environmental consultant - 2 wheels;Cane - single point;Walker - 4 wheels      Prior Function Level of Independence: Independent with assistive device(s)         Comments: amb with rollator prior to surgery     Hand Dominance        Extremity/Trunk Assessment        Lower Extremity Assessment Lower Extremity Assessment: RLE deficits/detail RLE Deficits / Details: ankle grossly WFL; knee extension and hip  flexion 2+/5, limited by post op weakness       Communication   Communication: No difficulties  Cognition Arousal/Alertness: Awake/alert Behavior During Therapy: WFL for tasks assessed/performed Overall Cognitive Status: Within Functional Limits for tasks assessed                                        General Comments  Exercises Total Joint Exercises Ankle Circles/Pumps: AROM;Both;10 reps Quad Sets: 5 reps;Both;AROM   Assessment/Plan    PT Assessment Patient needs continued PT services  PT Problem List Decreased strength;Decreased range of motion;Decreased activity tolerance;Pain;Decreased knowledge of use of DME;Decreased mobility       PT Treatment Interventions DME instruction;Gait training;Therapeutic exercise;Functional mobility training;Therapeutic activities;Patient/family education;Stair training    PT Goals (Current goals can be found in the Care Plan section)  Acute Rehab PT Goals Patient Stated Goal: get back to life PT Goal Formulation: With patient Time  For Goal Achievement: 01/01/19 Potential to Achieve Goals: Good    Frequency 7X/week   Barriers to discharge        Co-evaluation               AM-PAC PT "6 Clicks" Mobility  Outcome Measure Help needed turning from your back to your side while in a flat bed without using bedrails?: A Little Help needed moving from lying on your back to sitting on the side of a flat bed without using bedrails?: A Little Help needed moving to and from a bed to a chair (including a wheelchair)?: A Little Help needed standing up from a chair using your arms (e.g., wheelchair or bedside chair)?: A Little Help needed to walk in hospital room?: A Little Help needed climbing 3-5 steps with a railing? : A Lot 6 Click Score: 17    End of Session Equipment Utilized During Treatment: Gait belt Activity Tolerance: Patient tolerated treatment well Patient left: with call bell/phone within reach;in chair;with chair alarm set   PT Visit Diagnosis: Difficulty in walking, not elsewhere classified (R26.2)    Time: [id number] PT Time Calculation (min) (ACUTE ONLY): 20 min   Charges:   PT Evaluation $PT Eval Low Complexity: 1 Low          Drucilla Chaletara Farris Geiman, PT  Pager: [phone] Acute Rehab Dept [facility]): [phone]   12/24/2018   [facility] HospitalWILLIAMS,[name] Biel 12/24/2018, 5:41 PM

## 2018-12-24 NOTE — Anesthesia Procedure Notes (Signed)
Procedure Name: MAC Date/Time: 12/24/2018 7:46 AM Performed by: Niel Hummer, CRNA Pre-anesthesia Checklist: Patient identified, Emergency Drugs available, Patient being monitored and Suction available Patient Re-evaluated:Patient Re-evaluated prior to induction Oxygen Delivery Method: Simple face mask

## 2018-12-24 NOTE — Interval H&P Note (Signed)
History and Physical Interval Note:  12/24/2018 7:36 AM  Parks Ranger  has presented today for surgery, with the diagnosis of Degenerative joint disease right knee.  The various methods of treatment have been discussed with the patient and family. After consideration of risks, benefits and other options for treatment, the patient has consented to  Procedure(s): COMPUTER ASSISTED TOTAL KNEE ARTHROPLASTY (Right) as a surgical intervention.  The patient's history has been reviewed, patient examined, no change in status, stable for surgery.  I have reviewed the patient's chart and labs.  Questions were answered to the patient's satisfaction.     Hilton Cork Jeancarlo Leffler

## 2018-12-24 NOTE — Transfer of Care (Signed)
Immediate Anesthesia Transfer of Care Note  Patient: [name] [name]  Procedure(s) Performed: COMPUTER ASSISTED TOTAL KNEE ARTHROPLASTY (Right Knee)  Patient Location: PACU  Anesthesia Type:Spinal  Level of Consciousness: awake, alert  and oriented  Airway & Oxygen Therapy: Patient Spontanous Breathing and Patient connected to face mask oxygen  Post-op Assessment: Report given to RN and Post -op Vital signs reviewed and stable  Post vital signs: Reviewed and stable  Last Vitals:  Vitals Value Taken Time  BP    Temp    Pulse 66 12/24/18 1101  Resp 9 12/24/18 1101  SpO2 99 % 12/24/18 1101  Vitals shown include unvalidated device data.  Last Pain:  Vitals:   12/24/18 0548  TempSrc: Oral         Complications: No apparent anesthesia complications

## 2018-12-25 ENCOUNTER — Encounter (HOSPITAL_COMMUNITY): Payer: Self-pay | Admitting: Orthopedic Surgery

## 2018-12-25 DIAGNOSIS — M1711 Unilateral primary osteoarthritis, right knee: Principal | ICD-10-CM

## 2018-12-25 DIAGNOSIS — Z96651 Presence of right artificial knee joint: Secondary | ICD-10-CM

## 2018-12-25 LAB — BASIC METABOLIC PANEL
Anion gap: 8 (ref 5–15)
BUN: 12 mg/dL (ref 6–20)
CO2: 19 mmol/L — ABNORMAL LOW (ref 22–32)
Calcium: 8.1 mg/dL — ABNORMAL LOW (ref 8.9–10.3)
Chloride: 110 mmol/L (ref 98–111)
Creatinine, Ser: 0.7 mg/dL (ref 0.44–1.00)
GFR calc Af Amer: >60 mL/min (ref >60)
GFR calc non Af Amer: >60 mL/min (ref >60)
Glucose, Bld: 214 mg/dL — ABNORMAL HIGH (ref 70–99)
Potassium: 3.3 mmol/L — ABNORMAL LOW (ref 3.5–5.1)
Sodium: 137 mmol/L (ref 135–145)

## 2018-12-25 LAB — CBC
HCT: 34.8 % — ABNORMAL LOW (ref 36.0–46.0)
Hemoglobin: 11.1 g/dL — ABNORMAL LOW (ref 12.0–15.0)
MCH: 29.8 pg (ref 26.0–34.0)
MCHC: 31.9 g/dL (ref 30.0–36.0)
MCV: 93.3 fL (ref 80.0–100.0)
Platelets: 243 10*3/uL (ref 150–400)
RBC: 3.73 MIL/uL — ABNORMAL LOW (ref 3.87–5.11)
RDW: 12.8 % (ref 11.5–15.5)
WBC: 16 10*3/uL — ABNORMAL HIGH (ref 4.0–10.5)
nRBC: 0 % (ref 0.0–0.2)

## 2018-12-25 MED ORDER — DOCUSATE SODIUM 100 MG PO CAPS: 100.0000 mg | ORAL_CAPSULE | Freq: Two times a day (BID) | ORAL | 1 refills | Status: DC

## 2018-12-25 MED ORDER — ONDANSETRON HCL 4 MG PO TABS: 4.0000 mg | ORAL_TABLET | Freq: Four times a day (QID) | ORAL | 0 refills | Status: DC | PRN

## 2018-12-25 MED ORDER — HYDROCODONE-ACETAMINOPHEN 5-325 MG PO TABS: 1.0000 | ORAL_TABLET | ORAL | 0 refills | Status: DC | PRN

## 2018-12-25 MED ORDER — METHOCARBAMOL 500 MG PO TABS: 500.0000 mg | ORAL_TABLET | Freq: Four times a day (QID) | ORAL | 0 refills | Status: DC | PRN

## 2018-12-25 MED ORDER — SENNA 8.6 MG PO TABS: 1.0000 | ORAL_TABLET | Freq: Two times a day (BID) | ORAL | 0 refills | Status: DC

## 2018-12-25 MED ORDER — ASPIRIN 81 MG PO CHEW: 81.0000 mg | CHEWABLE_TABLET | Freq: Two times a day (BID) | ORAL | 1 refills | Status: DC

## 2018-12-25 MED ORDER — POTASSIUM CHLORIDE CRYS ER 20 MEQ PO TBCR
40.0000 meq | EXTENDED_RELEASE_TABLET | Freq: Once | ORAL | Status: AC
  Administered 2018-12-25: 40 meq via ORAL
  Filled 2018-12-25: qty 2

## 2018-12-25 MED ORDER — HYDRALAZINE HCL 20 MG/ML IJ SOLN: 5.0000 mg | INTRAMUSCULAR | Status: DC | PRN

## 2018-12-25 NOTE — Anesthesia Postprocedure Evaluation (Signed)
Anesthesia Post Note  Patient: [name] [name]  Procedure(s) Performed: COMPUTER ASSISTED TOTAL KNEE ARTHROPLASTY (Right Knee)     Patient location during evaluation: PACU Anesthesia Type: Spinal Level of consciousness: awake and alert Pain management: pain level controlled Vital Signs Assessment: post-procedure vital signs reviewed and stable Respiratory status: spontaneous breathing and respiratory function stable Cardiovascular status: blood pressure returned to baseline and stable Postop Assessment: spinal receding Anesthetic complications: no    Last Vitals:  Vitals:   12/25/18 0954 12/25/18 1001  BP: (!) 182/76 (!) 169/70  Pulse: 72 73  Resp: 16   Temp: (!) 36.4 C   SpO2: 100%     Last Pain:  Vitals:   12/25/18 0954  TempSrc: Oral  PainSc:                  [name]

## 2018-12-25 NOTE — Discharge Summary (Signed)
Physician Discharge Summary  Patient ID: [name] [name] MRN: [id number] DOB/AGE: 59/06/1960 59 y.o.  Admit date: 12/24/2018 Discharge date: 12/25/2018  Admission Diagnoses:  Osteoarthritis of right knee  Discharge Diagnoses:  Principal Problem:   Osteoarthritis of right knee   Past Medical History:  Diagnosis Date  . Anxiety 2008   seen in ED /w chest pain but told that she was having anxiety  . Arthritis   . History of kidney stones 1999  . History of migraine   . History of stress test 2010   done due to chest pain /w Dr. Jacinto HalimGanji. Pt. told that it was wnl.  . Hives of unknown origin    told by PCP Terri Piedra( [place]) to use Zrytec BID to control hives   . Hypertension   . Neuromuscular disorder (HCC)    back pain  . Sleep difficulties    has had a sleep study - t old that she didn't meet criteria for machine   . Syncope    due to pain    Surgeries: Procedure(s): COMPUTER ASSISTED TOTAL KNEE ARTHROPLASTY on 12/24/2018   Consultants (if any):   Discharged Condition: Improved  Hospital Course: [name] HakeConnie S [name] is an 58 y.o. female who was admitted 12/24/2018 with a diagnosis of Osteoarthritis of right knee and went to the operating room on 12/24/2018 and underwent the above named procedures.    She was given perioperative antibiotics:  Anti-infectives (From admission, onward)   Start     Dose/Rate Route Frequency Ordered Stop   12/24/18 1400  clindamycin (CLEOCIN) IVPB 600 mg     600 mg 100 mL/hr over 30 Minutes Intravenous Every 6 hours 12/24/18 1305 12/24/18 2016   12/24/18 0600  clindamycin (CLEOCIN) IVPB 900 mg     900 mg 100 mL/hr over 30 Minutes Intravenous On call to O.R. 12/24/18 [phone] 12/24/18 0813    .  She was given sequential compression devices, early ambulation, and ASA for DVT prophylaxis.  Patient's blood pressure was asymptomatically elevated posted; hypokalemia. Hospitalist evaluated the patient and repleted oral potassium. She recommends close follow up with PCP  for BP check.  She benefited maximally from the hospital stay and there were no complications.    Recent vital signs:  Vitals:   12/25/18 1001 12/25/18 1320  BP: (!) 169/70 (!) 154/71  Pulse: 73 73  Resp:  15  Temp:  98 F (36.7 C)  SpO2:  99%    Recent laboratory studies:  Lab Results  Component Value Date   HGB 11.1 (L) 12/25/2018   HGB 13.3 12/19/2018   HGB 14.1 07/24/2018   Lab Results  Component Value Date   WBC 16.0 (H) 12/25/2018   PLT 243 12/25/2018   Lab Results  Component Value Date   INR 0.8 12/19/2018   Lab Results  Component Value Date   NA 137 12/25/2018   K 3.3 (L) 12/25/2018   CL 110 12/25/2018   CO2 19 (L) 12/25/2018   BUN 12 12/25/2018   CREATININE 0.70 12/25/2018   GLUCOSE 214 (H) 12/25/2018    Discharge Medications:   Allergies as of 12/25/2018      Reactions   Gabapentin    Dizziness   Other    American cockroaches   Loratadine Hives, Rash   Penicillins Hives, Rash   Did it involve swelling of the face/tongue/throat, SOB, or low BP? No Did it involve sudden or severe rash/hives, skin peeling, or any reaction on the inside of your mouth or  nose? No Did you need to seek medical attention at a hospital or doctor's office? Yes When did it last happen?Childhood allergy If all above answers are "NO", may proceed with cephalosporin use.      Medication List    TAKE these medications   aspirin 81 MG chewable tablet Chew 1 tablet (81 mg total) by mouth 2 (two) times daily.   atorvastatin 20 MG tablet Commonly known as: LIPITOR TAKE 1 TABLET BY MOUTH EVERY DAY   cetirizine 10 MG tablet Commonly known as: ZYRTEC Take 10 mg by mouth daily.   diltiazem 120 MG 24 hr capsule Commonly known as: CARDIZEM CD TAKE 1 CAPSULE BY MOUTH EVERY DAY What changed:   how much to take  how to take this  when to take this  additional instructions   docusate sodium 100 MG capsule Commonly known as: COLACE Take 1 capsule (100 mg total)  by mouth 2 (two) times daily.   hydrochlorothiazide 12.5 MG capsule Commonly known as: MICROZIDE TAKE 1 CAPSULE BY MOUTH EVERY DAY What changed: how much to take   HYDROcodone-acetaminophen 5-325 MG tablet Commonly known as: NORCO/VICODIN Take 1 tablet by mouth every 4 (four) hours as needed for moderate pain (pain score 4-6).   ibuprofen 200 MG tablet Commonly known as: ADVIL Take 400 mg by mouth every 8 (eight) hours as needed for moderate pain.   levETIRAcetam 750 MG tablet Commonly known as: KEPPRA Take 1 tablet (750 mg total) by mouth 2 (two) times daily.   methocarbamol 500 MG tablet Commonly known as: ROBAXIN Take 1 tablet (500 mg total) by mouth every 6 (six) hours as needed for muscle spasms.   ondansetron 4 MG tablet Commonly known as: ZOFRAN Take 1 tablet (4 mg total) by mouth every 6 (six) hours as needed for nausea.   senna 8.6 MG Tabs tablet Commonly known as: SENOKOT Take 1 tablet (8.6 mg total) by mouth 2 (two) times daily.   temazepam 15 MG capsule Commonly known as: RESTORIL Take 1 capsule (15 mg total) by mouth at bedtime as needed for sleep.       Diagnostic Studies: Dg Knee Right Port  Result Date: 12/24/2018 CLINICAL DATA:  Right knee arthroplasty EXAM: PORTABLE RIGHT KNEE - 1-2 VIEW COMPARISON:  None. FINDINGS: Postoperative changes from right total knee arthroplasty. Arthroplasty components are in their expected alignment without evidence of immediate postoperative complication. Expected postoperative changes within the anterior soft tissues. IMPRESSION: Right total knee arthroplasty without evidence of immediate postoperative complication. Electronically Signed   By: Davina Poke M.D.   On: 12/24/2018 11:38    Disposition: Discharge disposition: 01-Home or Self Care       Discharge Instructions    Call MD / Call 911   Complete by: As directed    If you experience chest pain or shortness of breath, CALL 911 and be transported to the  hospital emergency room.  If you develope a fever above 101 F, pus (white drainage) or increased drainage or redness at the wound, or calf pain, call your surgeon's office.   Constipation Prevention   Complete by: As directed    Drink plenty of fluids.  Prune juice may be helpful.  You may use a stool softener, such as Colace (over the counter) 100 mg twice a day.  Use MiraLax (over the counter) for constipation as needed.   Diet - low sodium heart healthy   Complete by: As directed    Driving restrictions   Complete  by: As directed    No driving for 6 weeks   Increase activity slowly as tolerated   Complete by: As directed    Lifting restrictions   Complete by: As directed    No lifting for 6 weeks   TED hose   Complete by: As directed    Use stockings (TED hose) for 2 weeks on both leg(s).  You may remove them at night for sleeping.      Follow-up Information    Zade Falkner, Arlys John, MD. Schedule an appointment as soon as possible for a visit in 2 weeks.   Specialty: Orthopedic Surgery Why: For wound re-check Contact information: [address] [place] [address] [phone]            Signed: Iline Oven Mariellen Blaney 12/25/2018, 1:34 PM

## 2018-12-25 NOTE — Consult Note (Signed)
Medical Consultation   [name] HakeConnie S Fehnel  ZOX:[id number]  DOB: 07/20/1960  DOA: 12/24/2018  PCP: Donita BrooksPickard, Warren T, MD  Outpatient Specialists: Dr Linna CapriceSwinteck   Requesting physician:  Dr Linna CapriceSwinteck with Orthopedics  Reason for consultation: Hypertension and hypokalemia.   History of Present Illness: [name] [name] is an 58 y.o. female with prior h/o hypertension, kidney stones, anxiety, right knee arthritis, underwent right total knee arthroplasty on 12/24/2018, and today her bp was found to be in 160/70to 180/76 mm hg and potassium of 3.3 . [facility] consulted for management of hypertension and hypokalemia.  Pt denies any chest pain, sob, cough, fever or chills, nausea, vomiting, abdominal pain, diarrhea, headache, dizziness, syncope, tingling or numbness of the legs and hands, denies any dysuria, hematemesis or hematochezia.  She reports her bp is always slightly high when in the hospital and she also reports painful right knee and working with PT, that caused her bp to be higher than usual.     Review of Systems:  As per HPI , " "All others reviewed and are negative  Past Medical History: Past Medical History:  Diagnosis Date  . Anxiety 2008   seen in ED /w chest pain but told that she was having anxiety  . Arthritis   . History of kidney stones 1999  . History of migraine   . History of stress test 2010   done due to chest pain /w Dr. Jacinto HalimGanji. Pt. told that it was wnl.  . Hives of unknown origin    told by PCP Terri Piedra( [place]) to use Zrytec BID to control hives   . Hypertension   . Neuromuscular disorder (HCC)    back pain  . Sleep difficulties    has had a sleep study - t old that she didn't meet criteria for machine   . Syncope    due to pain    Past Surgical History: Past Surgical History:  Procedure Laterality Date  . COLONOSCOPY  2016  . DECOMPRESSIVE LUMBAR LAMINECTOMY LEVEL 1 N/A 10/28/2013   Procedure: DECOMPRESSION L4-5;  Surgeon: Venita Lickahari Brooks, MD;  Location: [facility]  OR;  Service: Orthopedics;  Laterality: N/A;  . KNEE ARTHROSCOPY  12/2008   left knee  . KNEE ARTHROSCOPY Right 01/27/2018  . PARTIAL HYSTERECTOMY  10/1994  . thumb surgery Right 03/1975     Allergies:   Allergies  Allergen Reactions  . Gabapentin     Dizziness  . Other     American cockroaches  . Loratadine Hives and Rash  . Penicillins Hives and Rash    Did it involve swelling of the face/tongue/throat, SOB, or low BP? No Did it involve sudden or severe rash/hives, skin peeling, or any reaction on the inside of your mouth or nose? No Did you need to seek medical attention at a hospital or doctor's office? Yes When did it last happen?Childhood allergy If all above answers are "NO", may proceed with cephalosporin use.      Social History:  reports that she has never smoked. She has never used smokeless tobacco. She reports previous alcohol use. She reports that she does not use drugs.   Family History: Family History  Problem Relation Age of Onset  . Stroke Father   . Diabetes Other   . Hyperlipidemia Other   . Heart disease Other   . Hypertension Other   . Cancer Other   . Breast cancer Neg Hx  Family history reviewed and  pertinent for hypertension.    Physical Exam: Vitals:   12/25/18 0126 12/25/18 0414 12/25/18 0954 12/25/18 1001  BP: (!) 154/71 (!) 145/69 (!) 182/76 (!) 169/70  Pulse: 62 63 72 73  Resp: 16 16 16    Temp: 97.6 F (36.4 C) 97.9 F (36.6 C) (!) 97.5 F (36.4 C)   TempSrc: Oral Oral Oral   SpO2: 99% 100% 100%   Weight:      Height:        Constitutional:  Alert and awake, oriented x3, not in any acute distress. Eyes: PERLA,  ENMT: external ears and nose appear normal, Neck: neck appears normal, no masses, CVS: S1-S2 clear, no murmur. No pedal edema.  Respiratory:  clear to auscultation bilaterally, no wheezing, rales or rhonchi. Respiratory effort normal.  Abdomen: soft nontender, nondistended, normal bowel sounds,   Musculoskeletal: : right knee bandaged.  Neuro:  Alert and oriented and able to move all extremities, did not check her gait.  Psych: judgement and insight appear normal, stable mood and affect, mental status Skin: no rashes or lesions or ulcers, no induration or nodules    Data reviewed:  I have personally reviewed following labs and imaging studies Labs:  CBC: Recent Labs  Lab 12/19/18 1152 12/25/18 0240  WBC 9.5 16.0*  HGB 13.3 11.1*  HCT 42.6 34.8*  MCV 92.8 93.3  PLT 284 243    Basic Metabolic Panel: Recent Labs  Lab 12/19/18 1152 12/25/18 0240  NA 142 137  K 3.8 3.3*  CL 108 110  CO2 26 19*  GLUCOSE 103* 214*  BUN 15 12  CREATININE 0.97 0.70  CALCIUM 8.9 8.1*   GFR Estimated Creatinine Clearance: 81.4 mL/min (by C-G formula based on SCr of 0.7 mg/dL). Liver Function Tests: Recent Labs  Lab 12/19/18 1152  AST 21  ALT 20  ALKPHOS 109  BILITOT 0.7  PROT 7.0  ALBUMIN 4.1   No results for input(s): LIPASE, AMYLASE in the last 168 hours. No results for input(s): AMMONIA in the last 168 hours. Coagulation profile Recent Labs  Lab 12/19/18 1152  INR 0.8    Cardiac Enzymes: No results for input(s): CKTOTAL, CKMB, CKMBINDEX, TROPONINI in the last 168 hours. BNP: Invalid input(s): POCBNP CBG: No results for input(s): GLUCAP in the last 168 hours. D-Dimer No results for input(s): DDIMER in the last 72 hours. Hgb A1c No results for input(s): HGBA1C in the last 72 hours. Lipid Profile No results for input(s): CHOL, HDL, LDLCALC, TRIG, CHOLHDL, LDLDIRECT in the last 72 hours. Thyroid function studies No results for input(s): TSH, T4TOTAL, T3FREE, THYROIDAB in the last 72 hours.  Invalid input(s): FREET3 Anemia work up No results for input(s): VITAMINB12, FOLATE, FERRITIN, TIBC, IRON, RETICCTPCT in the last 72 hours. Urinalysis    Component Value Date/Time   COLORURINE YELLOW 12/19/2018 1152   APPEARANCEUR CLEAR 12/19/2018 1152   LABSPEC 1.008  12/19/2018 1152   PHURINE 6.0 12/19/2018 1152   GLUCOSEU NEGATIVE 12/19/2018 1152   HGBUR NEGATIVE 12/19/2018 1152   BILIRUBINUR NEGATIVE 12/19/2018 1152   KETONESUR NEGATIVE 12/19/2018 1152   PROTEINUR NEGATIVE 12/19/2018 1152   UROBILINOGEN 0.2 12/03/2014 1816   NITRITE NEGATIVE 12/19/2018 1152   LEUKOCYTESUR TRACE (A) 12/19/2018 1152     Microbiology Recent Results (from the past 240 hour(s))  Surgical pcr screen     Status: None   Collection Time: 12/19/18 11:52 AM   Specimen: Nasal Swab  Result Value Ref Range Status  MRSA, PCR NEGATIVE NEGATIVE Final   Staphylococcus aureus NEGATIVE NEGATIVE Final    Comment: (NOTE) The Xpert SA Assay (FDA approved for NASAL specimens in patients 85 years of age and older), is one component of a comprehensive surveillance program. It is not intended to diagnose infection nor to guide or monitor treatment. Performed at [facility], [place] [address], [place], [place] [address]   SARS CORONAVIRUS 2 Nasal Swab Aptima Multi Swab     Status: None   Collection Time: 12/20/18  7:38 PM   Specimen: Aptima Multi Swab; Nasal Swab  Result Value Ref Range Status   SARS Coronavirus 2 NEGATIVE NEGATIVE Final    Comment: (NOTE) SARS-CoV-2 target nucleic acids are NOT DETECTED. The SARS-CoV-2 RNA is generally detectable in upper and lower respiratory specimens during the acute phase of infection. Negative results do not preclude SARS-CoV-2 infection, do not rule out co-infections with other pathogens, and should not be used as the sole basis for treatment or other patient management decisions. Negative results must be combined with clinical observations, patient history, and epidemiological information. The expected result is Negative. Fact Sheet for Patients: SugarRoll.be Fact Sheet for Healthcare Providers: https://www.woods-mathews.com/ This test is not yet approved or cleared by the  [place] FDA and  has been authorized for detection and/or diagnosis of SARS-CoV-2 by FDA under an Emergency Use Authorization (EUA). This EUA will remain  in effect (meaning this test can be used) for the duration of the COVID-19 declaration under Section 56 4(b)(1) of the Act, 21 U.S.C. section 360bbb-3(b)(1), unless the authorization is terminated or revoked sooner. Performed at [facility], [place] [address], [place],  [address]        Inpatient Medications:   Scheduled Meds: . aspirin  81 mg Oral BID  . atorvastatin  20 mg Oral Daily  . celecoxib  200 mg Oral BID  . diltiazem  120 mg Oral Daily  . docusate sodium  100 mg Oral BID  . hydrochlorothiazide  12.5 mg Oral Daily  . senna  1 tablet Oral BID   Continuous Infusions: . sodium chloride Stopped (12/25/18 1035)  . methocarbamol (ROBAXIN) IV Stopped (12/24/18 1224)     Radiological Exams on Admission: Dg Knee Right Port  Result Date: 12/24/2018 CLINICAL DATA:  Right knee arthroplasty EXAM: PORTABLE RIGHT KNEE - 1-2 VIEW COMPARISON:  None. FINDINGS: Postoperative changes from right total knee arthroplasty. Arthroplasty components are in their expected alignment without evidence of immediate postoperative complication. Expected postoperative changes within the anterior soft tissues. IMPRESSION: Right total knee arthroplasty without evidence of immediate postoperative complication. Electronically Signed   By: Davina Poke M.D.   On: 12/24/2018 11:38    Impression/Recommendations Principal Problem:   Osteoarthritis of right knee     Hypertensive urgency:  Probably secondary to a combination of pain and decadron.  Resolved , repeat BP is 154/71 mmhg.  Pt is asymptomatic.  Would not change any her home regimen.    Hypokalemia  replaced the potassium and recommend to check her potassium on Monday at PCP office.   Thank you for this consultation.  Patient stable for discharge home with BP of  154/71 and her potassium has been repleted.  Recommend outpatient follow up with CBC and BMP on Monday to check electrolytes and wbc count.    Time Spent: 35 minutes.   Hosie Poisson M.D. [facility] 12/25/2018, 1:20 PM

## 2018-12-25 NOTE — Progress Notes (Signed)
    Subjective:  Patient reports pain as mild to moderate.  Denies N/V/CP/SOB. No c/o.  Objective:   VITALS:   Vitals:   12/25/18 0126 12/25/18 0414 12/25/18 0954 12/25/18 1001  BP: (!) 154/71 (!) 145/69 (!) 182/76 (!) 169/70  Pulse: 62 63 72 73  Resp: 16 16 16    Temp: 97.6 F (36.4 C) 97.9 F (36.6 C) (!) 97.5 F (36.4 C)   TempSrc: Oral Oral Oral   SpO2: 99% 100% 100%   Weight:      Height:        NAD ABD soft Sensation intact distally Intact pulses distally Dorsiflexion/Plantar flexion intact Incision: dressing C/D/I Compartment soft   Lab Results  Component Value Date   WBC 16.0 (H) 12/25/2018   HGB 11.1 (L) 12/25/2018   HCT 34.8 (L) 12/25/2018   MCV 93.3 12/25/2018   PLT 243 12/25/2018   BMET    Component Value Date/Time   NA 137 12/25/2018 0240   K 3.3 (L) 12/25/2018 0240   CL 110 12/25/2018 0240   CO2 19 (L) 12/25/2018 0240   GLUCOSE 214 (H) 12/25/2018 0240   BUN 12 12/25/2018 0240   CREATININE 0.70 12/25/2018 0240   CREATININE 0.97 07/24/2018 0913   CALCIUM 8.1 (L) 12/25/2018 0240   GFRNONAA >60 12/25/2018 0240   GFRNONAA 83 04/17/2018 1009   GFRAA >60 12/25/2018 0240   GFRAA 96 04/17/2018 1009     Assessment/Plan: 1 Day Post-Op   Principal Problem:   Osteoarthritis of right knee   WBAT with walker DVT ppx: Aspirin, SCDs, TEDS PO pain control PT/OT HTN, hypokalemia: will consult TRH Dispo: ready for d/c home with OPPT when ok with hospitalist    Hilton Cork [name] [name] 12/25/2018, 1:10 PM   Rod Can, MD Cell: [phone] [place] [facility]  [facility] [address], [address], [place], [place] [address] Phone: [phone] [email or web address] Facebook  [employer]

## 2018-12-25 NOTE — Progress Notes (Signed)
Physical Therapy Treatment Patient Details Name: [name] [name] MRN: [id number] DOB: 03/02/1961 Today's Date: 12/25/2018    History of Present Illness s/p R TKA. PMH: HTN, back surgery, morbid obesity, vestibular d/o    PT Comments    Pt very motivated and progressing well with mobility.  This am, pt ambulated increased distance in hall, negotiated stairs and performed home therex program with assist - written instruction provided.  Pt eager for dc home.   Follow Up Recommendations  Follow surgeon's recommendation for DC plan and follow-up therapies     Equipment Recommendations  None recommended by PT    Recommendations for Other Services       Precautions / Restrictions Precautions Precautions: Knee;Fall Restrictions Weight Bearing Restrictions: No Other Position/Activity Restrictions: WBAT    Mobility  Bed Mobility Overal bed mobility: Needs Assistance Bed Mobility: Supine to Sit     Supine to sit: Supervision     General bed mobility comments: Increased time  Transfers Overall transfer level: Needs assistance Equipment used: Rolling walker (2 wheeled) Transfers: Sit to/from Stand Sit to Stand: Min guard;Supervision         General transfer comment: cues for hand placement  Ambulation/Gait Ambulation/Gait assistance: Min guard;Supervision Gait Distance (Feet): 150 Feet Assistive device: Rolling walker (2 wheeled) Gait Pattern/deviations: Step-to pattern;Decreased stance time - right;Decreased weight shift to right     General Gait Details: cues for sequence, posture and position from [employer]   Stairs Stairs: Yes Stairs assistance: Min assist Stair Management: No rails;Step to pattern;Forwards;With walker Number of Stairs: 2 General stair comments: single step twice with cues for sequence and foot/RW placement   Wheelchair Mobility    Modified Rankin (Stroke Patients Only)       Balance Overall balance assessment: Mild deficits observed, not  formally tested                                          Cognition Arousal/Alertness: Awake/alert Behavior During Therapy: WFL for tasks assessed/performed Overall Cognitive Status: Within Functional Limits for tasks assessed                                        Exercises Total Joint Exercises Ankle Circles/Pumps: AROM;Both;20 reps;Supine Quad Sets: Both;AROM;10 reps;Supine Heel Slides: AAROM;Right;15 reps;Supine Straight Leg Raises: AAROM;AROM;Right;15 reps;Supine Long Arc Quad: AAROM;AROM;Right;10 reps;Seated Knee Flexion: AAROM;AROM;Right;10 reps;Seated Goniometric ROM: AAROM R knee -5 - 70    General Comments        Pertinent Vitals/Pain Pain Assessment: 0-10 Pain Score: 4  Pain Location: right knee Pain Descriptors / Indicators: Aching;Sore Pain Intervention(s): Limited activity within patient's tolerance;Monitored during session;Premedicated before session;Ice applied    Home Living                      Prior Function            PT Goals (current goals can now be found in the care plan section) Acute Rehab PT Goals Patient Stated Goal: get back to life PT Goal Formulation: With patient Time For Goal Achievement: 01/01/19 Potential to Achieve Goals: Good Progress towards PT goals: Progressing toward goals    Frequency    7X/week      PT Plan Current plan remains appropriate    Co-evaluation  AM-PAC PT "6 Clicks" Mobility   Outcome Measure  Help needed turning from your back to your side while in a flat bed without using bedrails?: A Little Help needed moving from lying on your back to sitting on the side of a flat bed without using bedrails?: A Little Help needed moving to and from a bed to a chair (including a wheelchair)?: A Little Help needed standing up from a chair using your arms (e.g., wheelchair or bedside chair)?: A Little Help needed to walk in hospital room?: A Little Help  needed climbing 3-5 steps with a railing? : A Little 6 Click Score: 18    End of Session Equipment Utilized During Treatment: Gait belt Activity Tolerance: Patient tolerated treatment well Patient left: with call bell/phone within reach;in chair;with chair alarm set Nurse Communication: Mobility status PT Visit Diagnosis: Difficulty in walking, not elsewhere classified (R26.2)     Time: 8882-8003 PT Time Calculation (min) (ACUTE ONLY): 40 min  Charges:  $Gait Training: 8-22 mins $Therapeutic Exercise: 8-22 mins $Therapeutic Activity: 8-22 mins                     Debe Coder PT Acute Rehabilitation Services Pager [phone] Office [phone]    Tracy Gerken 12/25/2018, 12:31 PM

## 2018-12-26 NOTE — Anesthesia Procedure Notes (Addendum)
Spinal  Patient location during procedure: OR Start time: 12/26/2018 7:50 AM End time: 12/26/2018 7:58 AM Staffing Anesthesiologist: [name], MD Performed: anesthesiologist  Preanesthetic Checklist Completed: patient identified, site marked, surgical consent, pre-op evaluation, timeout performed, IV checked, risks and benefits discussed and monitors and equipment checked Spinal Block Patient position: sitting Prep: site prepped and draped and DuraPrep Patient monitoring: heart rate, continuous pulse ox and blood pressure Approach: right paramedian Location: L3-4 Injection technique: single-shot Needle Needle type: Sprotte  Needle gauge: 24 G Needle length: 9 cm Additional Notes Expiration date of kit checked and confirmed. CRNA attempt x 2. Patient tolerated procedure well, without complications.

## 2018-12-26 NOTE — Addendum Note (Signed)
Addendum  created 12/26/18 0726 by [name], MD   Child order released for a procedure order, Clinical Note Signed, Intraprocedure Blocks edited

## 2019-01-21 ENCOUNTER — Other Ambulatory Visit: Payer: Self-pay | Admitting: Family Medicine

## 2019-01-21 DIAGNOSIS — R001 Bradycardia, unspecified: Secondary | ICD-10-CM

## 2019-02-09 ENCOUNTER — Other Ambulatory Visit: Payer: Self-pay | Admitting: Family Medicine

## 2019-02-09 DIAGNOSIS — R001 Bradycardia, unspecified: Secondary | ICD-10-CM

## 2019-02-09 MED ORDER — HYDROCHLOROTHIAZIDE 12.5 MG PO CAPS: 12.5000 mg | ORAL_CAPSULE | Freq: Every day | ORAL | 1 refills | Status: DC

## 2019-02-09 MED ORDER — DILTIAZEM HCL ER COATED BEADS 120 MG PO CP24: 120.0000 mg | ORAL_CAPSULE | Freq: Every day | ORAL | 1 refills | Status: DC

## 2019-02-14 ENCOUNTER — Other Ambulatory Visit: Payer: Self-pay | Admitting: Family Medicine

## 2019-02-14 DIAGNOSIS — R001 Bradycardia, unspecified: Secondary | ICD-10-CM

## 2019-03-11 ENCOUNTER — Other Ambulatory Visit: Payer: Self-pay | Admitting: Family Medicine

## 2019-04-01 ENCOUNTER — Other Ambulatory Visit: Payer: Self-pay | Admitting: Family Medicine

## 2019-04-16 ENCOUNTER — Other Ambulatory Visit: Payer: Self-pay

## 2019-04-17 ENCOUNTER — Other Ambulatory Visit: Payer: Self-pay

## 2019-04-17 ENCOUNTER — Ambulatory Visit (INDEPENDENT_AMBULATORY_CARE_PROVIDER_SITE_OTHER): Payer: 59 | Admitting: Family Medicine

## 2019-04-17 DIAGNOSIS — F418 Other specified anxiety disorders: Secondary | ICD-10-CM

## 2019-04-17 MED ORDER — ALPRAZOLAM 0.5 MG PO TABS: 0.5000 mg | ORAL_TABLET | Freq: Three times a day (TID) | ORAL | 0 refills | Status: DC | PRN

## 2019-04-17 NOTE — Progress Notes (Signed)
Subjective:    Patient ID: [name] [name], female    DOB: 1960/11/30, 58 y.o.   MRN: [id number]  HPI Patient is being seen today as a telephone visit.  Patient consents to be seen via telephone.  Phone call began at 1019.  Phone call concluded at 1027.  Patient's domestic partner is suffering with severe congestive heart failure.  Patient's partner is in the hospital and is not doing well.  The patient is concerned that this person is going to pass away.  This has her extremely anxious.  She is unable to sleep.  She feels anxious all the time.  At times she is having panic attacks.  She is lost her appetite.  She feels consistently nervous.  She denies depression.  She denies suicidal ideation however she feels overcome with grief and anxiety.  In the past when her mother died, the patient used Xanax just to help get her through the situation.  She was hoping that we could possibly do something similar right now as she potentially grieves the loss of her domestic partner.  She is currently out of work after recent knee surgery.  She denies using any alcohol or drugs.  She is not taking any mood altering substances. Past Medical History:  Diagnosis Date  . Anxiety 2008   seen in ED /w chest pain but told that she was having anxiety  . Arthritis   . History of kidney stones 1999  . History of migraine   . History of stress test 2010   done due to chest pain /w Dr. Einar Gip. Pt. told that it was wnl.  . Hives of unknown origin    told by PCP [name] [name]) to use Zrytec BID to control hives   . Hypertension   . Neuromuscular disorder ([place])    back pain  . Sleep difficulties    has had a sleep study - t old that she didn't meet criteria for machine   . Syncope    due to pain   Past Surgical History:  Procedure Laterality Date  . COLONOSCOPY  2016  . DECOMPRESSIVE LUMBAR LAMINECTOMY LEVEL 1 N/A 10/28/2013   Procedure: DECOMPRESSION L4-5;  Surgeon: Melina Schools, MD;  Location: [facility];  Service:  Orthopedics;  Laterality: N/A;  . KNEE ARTHROPLASTY Right 12/24/2018   Procedure: COMPUTER ASSISTED TOTAL KNEE ARTHROPLASTY;  Surgeon: Rod Can, MD;  Location: [facility];  Service: Orthopedics;  Laterality: Right;  . KNEE ARTHROSCOPY  12/2008   left knee  . KNEE ARTHROSCOPY Right 01/27/2018  . PARTIAL HYSTERECTOMY  10/1994  . thumb surgery Right 03/1975   Current Outpatient Medications on File Prior to Visit  Medication Sig Dispense Refill  . atorvastatin (LIPITOR) 20 MG tablet TAKE 1 TABLET BY MOUTH EVERY DAY (Patient taking differently: Take 20 mg by mouth daily. ) 90 tablet 1  . diltiazem (CARDIZEM CD) 120 MG 24 hr capsule TAKE 1 CAPSULE BY MOUTH EVERY DAY 90 capsule 1  . hydrochlorothiazide (MICROZIDE) 12.5 MG capsule TAKE 1 CAPSULE BY MOUTH EVERY DAY 30 capsule 1  . aspirin 81 MG chewable tablet Chew 1 tablet (81 mg total) by mouth 2 (two) times daily. (Patient not taking: Reported on 04/17/2019) 60 tablet 1  . cetirizine (ZYRTEC) 10 MG tablet Take 10 mg by mouth daily.     Marland Kitchen docusate sodium (COLACE) 100 MG capsule Take 1 capsule (100 mg total) by mouth 2 (two) times daily. (Patient not taking: Reported on 04/17/2019) 60 capsule  1  . HYDROcodone-acetaminophen (NORCO/VICODIN) 5-325 MG tablet Take 1 tablet by mouth every 4 (four) hours as needed for moderate pain (pain score 4-6). (Patient not taking: Reported on 04/17/2019) 40 tablet 0  . ibuprofen (ADVIL) 200 MG tablet Take 400 mg by mouth every 8 (eight) hours as needed for moderate pain.    Marland Kitchen levETIRAcetam (KEPPRA) 750 MG tablet Take 1 tablet (750 mg total) by mouth 2 (two) times daily. (Patient not taking: Reported on 12/16/2018) 180 tablet 3  . methocarbamol (ROBAXIN) 500 MG tablet Take 1 tablet (500 mg total) by mouth every 6 (six) hours as needed for muscle spasms. (Patient not taking: Reported on 04/17/2019) 20 tablet 0  . ondansetron (ZOFRAN) 4 MG tablet Take 1 tablet (4 mg total) by mouth every 6 (six) hours as needed for nausea.  (Patient not taking: Reported on 04/17/2019) 20 tablet 0  . senna (SENOKOT) 8.6 MG TABS tablet Take 1 tablet (8.6 mg total) by mouth 2 (two) times daily. (Patient not taking: Reported on 04/17/2019) 120 tablet 0  . temazepam (RESTORIL) 15 MG capsule Take 1 capsule (15 mg total) by mouth at bedtime as needed for sleep. (Patient not taking: Reported on 12/16/2018) 90 capsule 1   Current Facility-Administered Medications on File Prior to Visit  Medication Dose Route Frequency Provider Last Rate Last Admin  . gadopentetate dimeglumine (MAGNEVIST) injection 20 mL  20 mL Intravenous Once PRN Sater, Pearletha Furl, MD       Allergies  Allergen Reactions  . Gabapentin     Dizziness  . Other     American cockroaches  . Loratadine Hives and Rash  . Penicillins Hives and Rash    Did it involve swelling of the face/tongue/throat, SOB, or low BP? No Did it involve sudden or severe rash/hives, skin peeling, or any reaction on the inside of your mouth or nose? No Did you need to seek medical attention at a hospital or doctor's office? Yes When did it last happen?Childhood allergy If all above answers are "NO", may proceed with cephalosporin use.    Social History   Socioeconomic History  . Marital status: Single    Spouse name: Not on file  . Number of children: 0  . Years of education: Not on file  . Highest education level: Not on file  Occupational History  . Occupation: Radiographer, therapeutic  Tobacco Use  . Smoking status: Never Smoker  . Smokeless tobacco: Never Used  Substance and Sexual Activity  . Alcohol use: Not Currently    Comment: rare  . Drug use: No  . Sexual activity: Not on file  Other Topics Concern  . Not on file  Social History Narrative  . Not on file   Social Determinants of Health   Financial Resource Strain:   . Difficulty of Paying Living Expenses: Not on file  Food Insecurity:   . Worried About Programme researcher, broadcasting/film/video in the Last Year: Not on file  . Ran  Out of Food in the Last Year: Not on file  Transportation Needs:   . Lack of Transportation (Medical): Not on file  . Lack of Transportation (Non-Medical): Not on file  Physical Activity:   . Days of Exercise per Week: Not on file  . Minutes of Exercise per Session: Not on file  Stress:   . Feeling of Stress : Not on file  Social Connections:   . Frequency of Communication with Friends and Family: Not on file  . Frequency of  Social Gatherings with Friends and Family: Not on file  . Attends Religious Services: Not on file  . Active Member of Clubs or Organizations: Not on file  . Attends BankerClub or Organization Meetings: Not on file  . Marital Status: Not on file  Intimate Partner Violence:   . Fear of Current or Ex-Partner: Not on file  . Emotionally Abused: Not on file  . Physically Abused: Not on file  . Sexually Abused: Not on file      Review of Systems  Psychiatric/Behavioral: Negative for agitation, behavioral problems, confusion, decreased concentration, dysphoric mood, hallucinations and self-injury. The patient is nervous/anxious. The patient is not hyperactive.        Objective:   Physical Exam        Assessment & Plan:  Situational anxiety  I offered my condolences on the situation with her domestic partner.  I will gladly give the patient Xanax 0.5 mg tablets.  She can use 1 tablet every 8 hours as needed for anxiety.  I cautioned the patient to use the medication sparingly to avoid dependency and habituation.  Recheck immediately if situation worsens.

## 2019-04-22 ENCOUNTER — Other Ambulatory Visit: Payer: Self-pay | Admitting: Family Medicine

## 2019-05-03 ENCOUNTER — Other Ambulatory Visit: Payer: Self-pay | Admitting: Family Medicine

## 2019-06-03 ENCOUNTER — Other Ambulatory Visit: Payer: Self-pay | Admitting: Family Medicine

## 2019-07-08 ENCOUNTER — Other Ambulatory Visit: Payer: Self-pay | Admitting: Family Medicine

## 2019-07-30 ENCOUNTER — Encounter: Payer: Self-pay | Admitting: Family Medicine

## 2019-08-03 ENCOUNTER — Encounter: Payer: Self-pay | Admitting: Family Medicine

## 2019-08-07 ENCOUNTER — Ambulatory Visit: Payer: 59 | Attending: Internal Medicine

## 2019-08-07 ENCOUNTER — Other Ambulatory Visit: Payer: Self-pay | Admitting: Family Medicine

## 2019-08-07 DIAGNOSIS — Z23 Encounter for immunization: Secondary | ICD-10-CM

## 2019-08-07 NOTE — Progress Notes (Signed)
   Covid-19 Vaccination Clinic  Name:  [name] [name]    MRN: [id number] DOB: [birth year]  08/07/2019  [name] was observed post Covid-19 immunization for 15 minutes without incident. She was provided with Vaccine Information Sheet and instruction to access the V-Safe system.   [name] was instructed to call 911 with any severe reactions post vaccine: Marland Kitchen Difficulty breathing  . Swelling of face and throat  . A fast heartbeat  . A bad rash all over body  . Dizziness and weakness   Immunizations Administered    Name Date Dose VIS Date Route   Pfizer COVID-19 Vaccine 08/07/2019  8:14 AM 0.3 mL 04/10/2019 Intramuscular   Manufacturer: [employer], [employer]   Lot: [id number]   NDC: [address]-1

## 2019-08-13 ENCOUNTER — Encounter: Payer: Self-pay | Admitting: Family Medicine

## 2019-08-27 ENCOUNTER — Ambulatory Visit: Payer: Self-pay | Admitting: Orthopedic Surgery

## 2019-08-31 ENCOUNTER — Ambulatory Visit: Payer: 59 | Attending: Internal Medicine

## 2019-08-31 DIAGNOSIS — Z23 Encounter for immunization: Secondary | ICD-10-CM

## 2019-08-31 NOTE — Progress Notes (Signed)
   Covid-19 Vaccination Clinic  Name:  [name] [name]    MRN: [id number] DOB: January 24, 1961  08/31/2019  [name] was observed post Covid-19 immunization for 15 minutes without incident. She was provided with Vaccine Information Sheet and instruction to access the V-Safe system.   [name] was instructed to call 911 with any severe reactions post vaccine: Marland Kitchen Difficulty breathing  . Swelling of face and throat  . A fast heartbeat  . A bad rash all over body  . Dizziness and weakness   Immunizations Administered    Name Date Dose VIS Date Route   Pfizer COVID-19 Vaccine 08/31/2019  9:14 AM 0.3 mL 06/24/2018 Intramuscular   Manufacturer: [employer], [employer]   Lot: [id number]   NDC: [address]-7

## 2019-09-08 ENCOUNTER — Other Ambulatory Visit: Payer: Self-pay | Admitting: Family Medicine

## 2019-09-08 DIAGNOSIS — R001 Bradycardia, unspecified: Secondary | ICD-10-CM

## 2019-09-09 NOTE — Patient Instructions (Addendum)
DUE TO COVID-19 ONLY ONE VISITOR IS ALLOWED TO COME WITH YOU AND STAY IN THE WAITING ROOM ONLY DURING PRE OP AND PROCEDURE DAY OF SURGERY. THE 1 VISITOR MAY VISIT WITH YOU AFTER SURGERY IN YOUR PRIVATE ROOM DURING VISITING HOURS ONLY!  YOU NEED TO HAVE A COVID 19 TEST ON: 09/14/19 @  12:30 am, THIS TEST MUST BE DONE BEFORE SURGERY, COME  [place], [place] , [address].  ([place]) ONCE YOUR COVID TEST IS COMPLETED, PLEASE BEGIN THE QUARANTINE INSTRUCTIONS AS OUTLINED IN YOUR HANDOUT.                [name] [name]    Your procedure is scheduled on: 09/17/19   Report to [facility] Main  Entrance   Report to SHORT STAY at: 5:30 AM     Call this number if you have problems the morning of surgery [phone]    Remember:   NO SOLID FOOD AFTER MIDNIGHT THE NIGHT PRIOR TO SURGERY. NOTHING BY MOUTH EXCEPT CLEAR LIQUIDS UNTIL: 4:30 am . PLEASE FINISH ENSURE DRINK PER SURGEON ORDER  WHICH NEEDS TO BE COMPLETED AT: 4:30 am .   CLEAR LIQUID DIET   Foods Allowed                                                                     Foods Excluded  Coffee and tea, regular and decaf                             liquids that you cannot  Plain Jell-O any favor except red or purple                                           see through such as: Fruit ices (not with fruit pulp)                                     milk, soups, orange juice  Iced Popsicles                                    All solid food Carbonated beverages, regular and diet                                    Cranberry, grape and apple juices Sports drinks like Gatorade Lightly seasoned clear broth or consume(fat free) Sugar, honey syrup  Sample Menu Breakfast                                Lunch                                     Supper Cranberry juice  Beef broth                            Chicken broth Jell-O                                     Grape juice                            Apple juice Coffee or tea                        Jell-O                                      Popsicle                                                Coffee or tea                        Coffee or tea  _____________________________________________________________________  BRUSH YOUR TEETH MORNING OF SURGERY AND RINSE YOUR MOUTH OUT, NO CHEWING GUM CANDY OR MINTS.     Take these medicines the morning of surgery with A SIP OF WATER: cetirizine,diltiazem.                                 You may not have any metal on your body including hair pins and              piercings  Do not wear jewelry, make-up, lotions, powders or perfumes, deodorant             Do not wear nail polish on your fingernails.  Do not shave  48 hours prior to surgery.               Do not bring valuables to the hospital.  [facility] IS NOT             RESPONSIBLE   FOR VALUABLES.  Contacts, dentures or bridgework may not be worn into surgery.  Leave suitcase in the car. After surgery it may be brought to your room.     Patients discharged the day of surgery will not be allowed to drive home. IF YOU ARE HAVING SURGERY AND GOING HOME THE SAME DAY, YOU MUST HAVE AN ADULT TO DRIVE YOU HOME AND BE WITH YOU FOR 24 HOURS. YOU MAY GO HOME BY TAXI OR UBER OR ORTHERWISE, BUT AN ADULT MUST ACCOMPANY YOU HOME AND STAY WITH YOU FOR 24 HOURS.  Name and phone number of your driver:  Special Instructions: N/A              Please read over the following fact sheets you were given: _____________________________________________________________________  [facility] - Preparing for Surgery Before surgery, you can play an important role.  Because skin is not sterile, your skin needs to be as free of germs as possible.  You can reduce the number of germs on your skin by washing with CHG (chlorahexidine gluconate) soap before  surgery.  CHG is an antiseptic cleaner which kills germs and bonds with the skin to continue killing germs even  after washing. Please DO NOT use if you have an allergy to CHG or antibacterial soaps.  If your skin becomes reddened/irritated stop using the CHG and inform your nurse when you arrive at Short Stay. Do not shave (including legs and underarms) for at least 48 hours prior to the first CHG shower.  You may shave your face/neck. Please follow these instructions carefully:  1.  Shower with CHG Soap the night before surgery and the  morning of Surgery.  2.  If you choose to wash your hair, wash your hair first as usual with your  normal  shampoo.  3.  After you shampoo, rinse your hair and body thoroughly to remove the  shampoo.                           4.  Use CHG as you would any other liquid soap.  You can apply chg directly  to the skin and wash                       Gently with a scrungie or clean washcloth.  5.  Apply the CHG Soap to your body ONLY FROM THE NECK DOWN.   Do not use on face/ open                           Wound or open sores. Avoid contact with eyes, ears mouth and genitals (private parts).                       Wash face,  Genitals (private parts) with your normal soap.             6.  Wash thoroughly, paying special attention to the area where your surgery  will be performed.  7.  Thoroughly rinse your body with warm water from the neck down.  8.  DO NOT shower/wash with your normal soap after using and rinsing off  the CHG Soap.                9.  Pat yourself dry with a clean towel.            10.  Wear clean pajamas.            11.  Place clean sheets on your bed the night of your first shower and do not  sleep with pets. Day of Surgery : Do not apply any lotions/deodorants the morning of surgery.  Please wear clean clothes to the hospital/surgery center.  FAILURE TO FOLLOW THESE INSTRUCTIONS MAY RESULT IN THE CANCELLATION OF YOUR SURGERY PATIENT SIGNATURE_________________________________  NURSE  SIGNATURE__________________________________  ________________________________________________________________________   [name] [name]  An incentive spirometer is a tool that can help keep your lungs clear and active. This tool measures how well you are filling your lungs with each breath. Taking long deep breaths may help reverse or decrease the chance of developing breathing (pulmonary) problems (especially infection) following:  A long period of time when you are unable to move or be active. BEFORE THE PROCEDURE   If the spirometer includes an indicator to show your best effort, your nurse or respiratory therapist will set it to a desired goal.  If possible, sit up straight or lean slightly forward. Try not  to slouch.  Hold the incentive spirometer in an upright position. INSTRUCTIONS FOR USE  1. Sit on the edge of your bed if possible, or sit up as far as you can in bed or on a chair. 2. Hold the incentive spirometer in an upright position. 3. Breathe out normally. 4. Place the mouthpiece in your mouth and seal your lips tightly around it. 5. Breathe in slowly and as deeply as possible, raising the piston or the ball toward the top of the column. 6. Hold your breath for 3-5 seconds or for as long as possible. Allow the piston or ball to fall to the bottom of the column. 7. Remove the mouthpiece from your mouth and breathe out normally. 8. Rest for a few seconds and repeat Steps 1 through 7 at least 10 times every 1-2 hours when you are awake. Take your time and take a few normal breaths between deep breaths. 9. The spirometer may include an indicator to show your best effort. Use the indicator as a goal to work toward during each repetition. 10. After each set of 10 deep breaths, practice coughing to be sure your lungs are clear. If you have an incision (the cut made at the time of surgery), support your incision when coughing by placing a pillow or rolled up towels firmly  against it. Once you are able to get out of bed, walk around indoors and cough well. You may stop using the incentive spirometer when instructed by your caregiver.  RISKS AND COMPLICATIONS  Take your time so you do not get dizzy or light-headed.  If you are in pain, you may need to take or ask for pain medication before doing incentive spirometry. It is harder to take a deep breath if you are having pain. AFTER USE  Rest and breathe slowly and easily.  It can be helpful to keep track of a log of your progress. Your caregiver can provide you with a simple table to help with this. If you are using the spirometer at home, follow these instructions: [place] IF:   You are having difficultly using the spirometer.  You have trouble using the spirometer as often as instructed.  Your pain medication is not giving enough relief while using the spirometer.  You develop fever of 100.5 F (38.1 C) or higher. SEEK IMMEDIATE MEDICAL CARE IF:   You cough up bloody sputum that had not been present before.  You develop fever of 102 F (38.9 C) or greater.  You develop worsening pain at or near the incision site. MAKE SURE YOU:   Understand these instructions.  Will watch your condition.  Will get help right away if you are not doing well or get worse. Document Released: 08/27/2006 Document Revised: 07/09/2011 Document Reviewed: 10/28/2006 [facility] Patient Information 2014 [place], [place].   ________________________________________________________________________

## 2019-09-10 ENCOUNTER — Encounter (HOSPITAL_COMMUNITY)
Admission: RE | Admit: 2019-09-10 | Discharge: 2019-09-10 | Disposition: A | Discharge status: Home / Self Care | Payer: 59 | Source: Ambulatory Visit | Attending: Orthopedic Surgery | Admitting: Orthopedic Surgery

## 2019-09-10 ENCOUNTER — Encounter (HOSPITAL_COMMUNITY): Payer: Self-pay

## 2019-09-10 ENCOUNTER — Other Ambulatory Visit (HOSPITAL_COMMUNITY): Payer: 59

## 2019-09-10 ENCOUNTER — Other Ambulatory Visit: Payer: Self-pay

## 2019-09-10 DIAGNOSIS — Z01812 Encounter for preprocedural laboratory examination: Secondary | ICD-10-CM | POA: Insufficient documentation

## 2019-09-10 LAB — CBC
HCT: 44.5 % (ref 36.0–46.0)
Hemoglobin: 14.1 g/dL (ref 12.0–15.0)
MCH: 29 pg (ref 26.0–34.0)
MCHC: 31.7 g/dL (ref 30.0–36.0)
MCV: 91.6 fL (ref 80.0–100.0)
Platelets: 302 10*3/uL (ref 150–400)
RBC: 4.86 MIL/uL (ref 3.87–5.11)
RDW: 14.2 % (ref 11.5–15.5)
WBC: 7.5 10*3/uL (ref 4.0–10.5)
nRBC: 0 % (ref 0.0–0.2)

## 2019-09-10 LAB — COMPREHENSIVE METABOLIC PANEL
ALT: 19 U/L (ref 0–44)
AST: 22 U/L (ref 15–41)
Albumin: 4.3 g/dL (ref 3.5–5.0)
Alkaline Phosphatase: 94 U/L (ref 38–126)
Anion gap: 10 (ref 5–15)
BUN: 16 mg/dL (ref 6–20)
CO2: 26 mmol/L (ref 22–32)
Calcium: 8.7 mg/dL — ABNORMAL LOW (ref 8.9–10.3)
Chloride: 103 mmol/L (ref 98–111)
Creatinine, Ser: 0.79 mg/dL (ref 0.44–1.00)
GFR calc Af Amer: >60 mL/min (ref >60)
GFR calc non Af Amer: >60 mL/min (ref >60)
Glucose, Bld: 84 mg/dL (ref 70–99)
Potassium: 3.8 mmol/L (ref 3.5–5.1)
Sodium: 139 mmol/L (ref 135–145)
Total Bilirubin: 1.1 mg/dL (ref 0.3–1.2)
Total Protein: 7 g/dL (ref 6.5–8.1)

## 2019-09-10 LAB — URINALYSIS, ROUTINE W REFLEX MICROSCOPIC
Bacteria, UA: NONE SEEN
Bilirubin Urine: NEGATIVE
Glucose, UA: NEGATIVE mg/dL
Hgb urine dipstick: NEGATIVE
Ketones, ur: NEGATIVE mg/dL
Nitrite: NEGATIVE
Protein, ur: NEGATIVE mg/dL
Specific Gravity, Urine: 1.004 — ABNORMAL LOW (ref 1.005–1.030)
pH: 6 (ref 5.0–8.0)

## 2019-09-10 LAB — PROTIME-INR
INR: 1 (ref 0.8–1.2)
Prothrombin Time: 12.4 seconds (ref 11.4–15.2)

## 2019-09-10 LAB — SURGICAL PCR SCREEN
MRSA, PCR: NEGATIVE
Staphylococcus aureus: NEGATIVE

## 2019-09-10 NOTE — Progress Notes (Signed)
PCP -  Dr. Karrie Doffing Cardiologist -   Chest x-ray -  EKG - 12/19/18 EPIC Stress Test -  ECHO -  Cardiac Cath -   Sleep Study - yes CPAP - no  Fasting Blood Sugar -  Checks Blood Sugar _____ times a day  Blood Thinner Instructions: Aspirin Instructions: Last Dose: 03/20/19  Anesthesia review:   Patient denies shortness of breath, fever, cough and chest pain at PAT appointment   Patient verbalized understanding of instructions that were given to them at the PAT appointment. Patient was also instructed that they will need to review over the PAT instructions again at home before surgery.Dr. Karrie Doffing

## 2019-09-14 ENCOUNTER — Ambulatory Visit: Payer: Self-pay | Admitting: Orthopedic Surgery

## 2019-09-14 ENCOUNTER — Other Ambulatory Visit (HOSPITAL_COMMUNITY)
Admission: RE | Admit: 2019-09-14 | Discharge: 2019-09-14 | Disposition: A | Discharge status: Home / Self Care | Payer: 59 | Source: Ambulatory Visit | Attending: Orthopedic Surgery | Admitting: Orthopedic Surgery

## 2019-09-14 DIAGNOSIS — Z20822 Contact with and (suspected) exposure to covid-19: Secondary | ICD-10-CM | POA: Insufficient documentation

## 2019-09-14 DIAGNOSIS — Z01812 Encounter for preprocedural laboratory examination: Secondary | ICD-10-CM | POA: Diagnosis present

## 2019-09-14 LAB — SARS CORONAVIRUS 2 (TAT 6-24 HRS): SARS Coronavirus 2: NEGATIVE

## 2019-09-14 NOTE — H&P (Signed)
TOTAL KNEE ADMISSION H&P  Patient is being admitted for left total knee arthroplasty.  Subjective:  Chief Complaint:left knee pain.  HPI: [name] [name], 59 y.o. female, has a history of pain and functional disability in the left knee due to arthritis and has failed non-surgical conservative treatments for greater than 12 weeks to includeNSAID's and/or analgesics, corticosteriod injections, viscosupplementation injections, flexibility and strengthening excercises, supervised PT with diminished ADL's post treatment, use of assistive devices, weight reduction as appropriate and activity modification.  Onset of symptoms was gradual, starting 5 years ago with gradually worsening course since that time. The patient noted prior procedures on the knee to include  arthroscopy on the left knee(s).  Patient currently rates pain in the left knee(s) at 10 out of 10 with activity. Patient has night pain, worsening of pain with activity and weight bearing, pain that interferes with activities of daily living, pain with passive range of motion, crepitus and joint swelling.  Patient has evidence of subchondral cysts, subchondral sclerosis, periarticular osteophytes and joint space narrowing by imaging studies.  There is no active infection.  Patient Active Problem List   Diagnosis Date Noted  . Osteoarthritis of right knee 12/24/2018  . Abnormal brain MRI 07/02/2017  . Insomnia 02/26/2017  . Vestibular disorder 01/24/2017  . Headache disorder 01/24/2017  . Vitamin D deficiency 08/02/2015  . HTN (hypertension) 05/25/2015  . Syncope 02/17/2015  . Back pain 10/28/2013  . Obstructive sleep apnea 09/18/2013  . Morbid obesity-BMI 52 07/10/2013   Past Medical History:  Diagnosis Date  . Anxiety 2008   seen in ED /w chest pain but told that she was having anxiety  . Arthritis   . History of kidney stones 1999  . History of migraine   . History of stress test 2010   done due to chest pain /w Dr. Ganji. Pt.  told that it was wnl.  . Hives of unknown origin    told by PCP ( Lupton) to use Zrytec BID to control hives   . Hypertension   . Neuromuscular disorder (HCC)    back pain  . Sleep difficulties    has had a sleep study - t old that she didn't meet criteria for machine   . Syncope    due to pain    Past Surgical History:  Procedure Laterality Date  . COLONOSCOPY  2016  . DECOMPRESSIVE LUMBAR LAMINECTOMY LEVEL 1 N/A 10/28/2013   Procedure: DECOMPRESSION L4-5;  Surgeon: Dahari Brooks, MD;  Location: [facility];  Service: Orthopedics;  Laterality: N/A;  . KNEE ARTHROPLASTY Right 12/24/2018   Procedure: COMPUTER ASSISTED TOTAL KNEE ARTHROPLASTY;  Surgeon: Oluwatimileyin Vivier, MD;  Location: [facility];  Service: Orthopedics;  Laterality: Right;  . KNEE ARTHROSCOPY  12/2008   left knee  . KNEE ARTHROSCOPY Right 01/27/2018  . PARTIAL HYSTERECTOMY  10/1994  . thumb surgery Right 03/1975    Current Outpatient Medications  Medication Sig Dispense Refill Last Dose  . ALPRAZolam (XANAX) 0.5 MG tablet Take 1 tablet (0.5 mg total) by mouth 3 (three) times daily as needed. (Patient not taking: Reported on 08/31/2019) 30 tablet 0   . aspirin 81 MG chewable tablet Chew 1 tablet (81 mg total) by mouth 2 (two) times daily. (Patient not taking: Reported on 04/17/2019) 60 tablet 1   . atorvastatin (LIPITOR) 20 MG tablet TAKE 1 TABLET BY MOUTH EVERY DAY (Patient taking differently: Take 20 mg by mouth daily. ) 90 tablet 1   . cetirizine (ZYRTEC) 10   MG tablet Take 10 mg by mouth daily.      Marland Kitchen diltiazem (CARDIZEM CD) 120 MG 24 hr capsule TAKE 1 CAPSULE BY MOUTH EVERY DAY 90 capsule 0   . docusate sodium (COLACE) 100 MG capsule Take 1 capsule (100 mg total) by mouth 2 (two) times daily. (Patient not taking: Reported on 04/17/2019) 60 capsule 1   . hydrochlorothiazide (MICROZIDE) 12.5 MG capsule TAKE 1 CAPSULE BY MOUTH EVERY DAY (Patient taking differently: Take 12.5 mg by mouth daily. ) 30 capsule 5   . levETIRAcetam (KEPPRA)  750 MG tablet Take 1 tablet (750 mg total) by mouth 2 (two) times daily. (Patient not taking: Reported on 12/16/2018) 180 tablet 3   . naproxen sodium (ALEVE) 220 MG tablet Take 220 mg by mouth 2 (two) times daily as needed (pain.).     Marland Kitchen senna (SENOKOT) 8.6 MG TABS tablet Take 1 tablet (8.6 mg total) by mouth 2 (two) times daily. (Patient not taking: Reported on 04/17/2019) 120 tablet 0   . temazepam (RESTORIL) 15 MG capsule Take 1 capsule (15 mg total) by mouth at bedtime as needed for sleep. (Patient not taking: Reported on 12/16/2018) 90 capsule 1    No current facility-administered medications for this visit.   Facility-Administered Medications Ordered in Other Visits  Medication Dose Route Frequency Provider Last Rate Last Admin  . gadopentetate dimeglumine (MAGNEVIST) injection 20 mL  20 mL Intravenous Once PRN Sater, Pearletha Furl, MD       Allergies  Allergen Reactions  . Gabapentin     Dizziness  . Other     American cockroaches  . Loratadine Hives and Rash  . Penicillins Hives and Rash    Did it involve swelling of the face/tongue/throat, SOB, or low BP? No Did it involve sudden or severe rash/hives, skin peeling, or any reaction on the inside of your mouth or nose? No Did you need to seek medical attention at a hospital or doctor's office? Yes When did it last happen?Childhood allergy If all above answers are "NO", may proceed with cephalosporin use.     Social History   Tobacco Use  . Smoking status: Never Smoker  . Smokeless tobacco: Never Used  Substance Use Topics  . Alcohol use: Yes    Comment: rare    Family History  Problem Relation Age of Onset  . Stroke Father   . Diabetes Other   . Hyperlipidemia Other   . Heart disease Other   . Hypertension Other   . Cancer Other   . Breast cancer Neg Hx      Review of Systems  Constitutional: Negative.   HENT: Negative.   Eyes: Negative.   Respiratory: Negative.   Cardiovascular: Negative.    Gastrointestinal: Negative.   Endocrine: Negative.   Genitourinary: Negative.   Musculoskeletal: Positive for arthralgias.  Allergic/Immunologic: Negative.   Neurological: Negative.   Hematological: Negative.   Psychiatric/Behavioral: Negative.     Objective:  Physical Exam  Vitals reviewed. Constitutional: She is oriented to person, place, and time. She appears well-developed and well-nourished.  HENT:  Head: Normocephalic and atraumatic.  Eyes: Pupils are equal, round, and reactive to light. Conjunctivae and EOM are normal.  Neck: No thyromegaly present.  Cardiovascular: Normal rate, regular rhythm and intact distal pulses.  Respiratory: Effort normal. No respiratory distress.  GI: Soft. She exhibits no distension.  Genitourinary:    Genitourinary Comments: deferred   Musculoskeletal:     Cervical back: Normal range of motion.  Left knee: Swelling, effusion and bony tenderness present. Decreased range of motion. Tenderness present over the medial joint line and lateral joint line.  Neurological: She is alert and oriented to person, place, and time. She has normal reflexes.  Skin: Skin is warm and dry.  Psychiatric: She has a normal mood and affect. Her behavior is normal. Judgment and thought content normal.    Vital signs in last 24 hours: @VSRANGES @  Labs:   Estimated body mass index is 41.23 kg/m as calculated from the following:   Height as of 09/10/19: 5' (1.524 m).   Weight as of 09/10/19: 95.8 kg.   Imaging Review Plain radiographs demonstrate severe degenerative joint disease of the left knee(s). The overall alignment issignificant varus. The bone quality appears to be adequate for age and reported activity level.      Assessment/Plan:  End stage arthritis, left knee   The patient history, physical examination, clinical judgment of the provider and imaging studies are consistent with end stage degenerative joint disease of the left knee(s) and total  knee arthroplasty is deemed medically necessary. The treatment options including medical management, injection therapy arthroscopy and arthroplasty were discussed at length. The risks and benefits of total knee arthroplasty were presented and reviewed. The risks due to aseptic loosening, infection, stiffness, patella tracking problems, thromboembolic complications and other imponderables were discussed. The patient acknowledged the explanation, agreed to proceed with the plan and consent was signed. Patient is being admitted for inpatient treatment for surgery, pain control, PT, OT, prophylactic antibiotics, VTE prophylaxis, progressive ambulation and ADL's and discharge planning. The patient is planning to be discharged home with OPPT   Desires o/n stay.    Patient's anticipated LOS is less than 2 midnights, meeting these requirements: - Younger than 63 - Lives within 1 hour of care - Has a competent adult at home to recover with post-op recover - NO history of  - Chronic pain requiring opiods  - Diabetes  - Coronary Artery Disease  - Heart failure  - Heart attack  - Stroke  - DVT/VTE  - Cardiac arrhythmia  - Respiratory Failure/COPD  - Renal failure  - Anemia  - Advanced Liver disease

## 2019-09-14 NOTE — H&P (View-Only) (Signed)
TOTAL KNEE ADMISSION H&P  Patient is being admitted for left total knee arthroplasty.  Subjective:  Chief Complaint:left knee pain.  HPI: [name] [name], 59 y.o. female, has a history of pain and functional disability in the left knee due to arthritis and has failed non-surgical conservative treatments for greater than 12 weeks to includeNSAID's and/or analgesics, corticosteriod injections, viscosupplementation injections, flexibility and strengthening excercises, supervised PT with diminished ADL's post treatment, use of assistive devices, weight reduction as appropriate and activity modification.  Onset of symptoms was gradual, starting 5 years ago with gradually worsening course since that time. The patient noted prior procedures on the knee to include  arthroscopy on the left knee(s).  Patient currently rates pain in the left knee(s) at 10 out of 10 with activity. Patient has night pain, worsening of pain with activity and weight bearing, pain that interferes with activities of daily living, pain with passive range of motion, crepitus and joint swelling.  Patient has evidence of subchondral cysts, subchondral sclerosis, periarticular osteophytes and joint space narrowing by imaging studies.  There is no active infection.  Patient Active Problem List   Diagnosis Date Noted  . Osteoarthritis of right knee 12/24/2018  . Abnormal brain MRI 07/02/2017  . Insomnia 02/26/2017  . Vestibular disorder 01/24/2017  . Headache disorder 01/24/2017  . Vitamin D deficiency 08/02/2015  . HTN (hypertension) 05/25/2015  . Syncope 02/17/2015  . Back pain 10/28/2013  . Obstructive sleep apnea 09/18/2013  . Morbid obesity-BMI 52 07/10/2013   Past Medical History:  Diagnosis Date  . Anxiety 2008   seen in ED /w chest pain but told that she was having anxiety  . Arthritis   . History of kidney stones 1999  . History of migraine   . History of stress test 2010   done due to chest pain /w Dr. Jacinto Halim. Pt.  told that it was wnl.  . Hives of unknown origin    told by PCP Terri Piedra) to use Zrytec BID to control hives   . Hypertension   . Neuromuscular disorder (HCC)    back pain  . Sleep difficulties    has had a sleep study - t old that she didn't meet criteria for machine   . Syncope    due to pain    Past Surgical History:  Procedure Laterality Date  . COLONOSCOPY  2016  . DECOMPRESSIVE LUMBAR LAMINECTOMY LEVEL 1 N/A 10/28/2013   Procedure: DECOMPRESSION L4-5;  Surgeon: Venita Lick, MD;  Location: [facility];  Service: Orthopedics;  Laterality: N/A;  . KNEE ARTHROPLASTY Right 12/24/2018   Procedure: COMPUTER ASSISTED TOTAL KNEE ARTHROPLASTY;  Surgeon: Samson Frederic, MD;  Location: [facility];  Service: Orthopedics;  Laterality: Right;  . KNEE ARTHROSCOPY  12/2008   left knee  . KNEE ARTHROSCOPY Right 01/27/2018  . PARTIAL HYSTERECTOMY  10/1994  . thumb surgery Right 03/1975    Current Outpatient Medications  Medication Sig Dispense Refill Last Dose  . ALPRAZolam (XANAX) 0.5 MG tablet Take 1 tablet (0.5 mg total) by mouth 3 (three) times daily as needed. (Patient not taking: Reported on 08/31/2019) 30 tablet 0   . aspirin 81 MG chewable tablet Chew 1 tablet (81 mg total) by mouth 2 (two) times daily. (Patient not taking: Reported on 04/17/2019) 60 tablet 1   . atorvastatin (LIPITOR) 20 MG tablet TAKE 1 TABLET BY MOUTH EVERY DAY (Patient taking differently: Take 20 mg by mouth daily. ) 90 tablet 1   . cetirizine (ZYRTEC) 10  MG tablet Take 10 mg by mouth daily.      Marland Kitchen diltiazem (CARDIZEM CD) 120 MG 24 hr capsule TAKE 1 CAPSULE BY MOUTH EVERY DAY 90 capsule 0   . docusate sodium (COLACE) 100 MG capsule Take 1 capsule (100 mg total) by mouth 2 (two) times daily. (Patient not taking: Reported on 04/17/2019) 60 capsule 1   . hydrochlorothiazide (MICROZIDE) 12.5 MG capsule TAKE 1 CAPSULE BY MOUTH EVERY DAY (Patient taking differently: Take 12.5 mg by mouth daily. ) 30 capsule 5   . levETIRAcetam (KEPPRA)  750 MG tablet Take 1 tablet (750 mg total) by mouth 2 (two) times daily. (Patient not taking: Reported on 12/16/2018) 180 tablet 3   . naproxen sodium (ALEVE) 220 MG tablet Take 220 mg by mouth 2 (two) times daily as needed (pain.).     Marland Kitchen senna (SENOKOT) 8.6 MG TABS tablet Take 1 tablet (8.6 mg total) by mouth 2 (two) times daily. (Patient not taking: Reported on 04/17/2019) 120 tablet 0   . temazepam (RESTORIL) 15 MG capsule Take 1 capsule (15 mg total) by mouth at bedtime as needed for sleep. (Patient not taking: Reported on 12/16/2018) 90 capsule 1    No current facility-administered medications for this visit.   Facility-Administered Medications Ordered in Other Visits  Medication Dose Route Frequency Provider Last Rate Last Admin  . gadopentetate dimeglumine (MAGNEVIST) injection 20 mL  20 mL Intravenous Once PRN Sater, Pearletha Furl, MD       Allergies  Allergen Reactions  . Gabapentin     Dizziness  . Other     American cockroaches  . Loratadine Hives and Rash  . Penicillins Hives and Rash    Did it involve swelling of the face/tongue/throat, SOB, or low BP? No Did it involve sudden or severe rash/hives, skin peeling, or any reaction on the inside of your mouth or nose? No Did you need to seek medical attention at a hospital or doctor's office? Yes When did it last happen?Childhood allergy If all above answers are "NO", may proceed with cephalosporin use.     Social History   Tobacco Use  . Smoking status: Never Smoker  . Smokeless tobacco: Never Used  Substance Use Topics  . Alcohol use: Yes    Comment: rare    Family History  Problem Relation Age of Onset  . Stroke Father   . Diabetes Other   . Hyperlipidemia Other   . Heart disease Other   . Hypertension Other   . Cancer Other   . Breast cancer Neg Hx      Review of Systems  Constitutional: Negative.   HENT: Negative.   Eyes: Negative.   Respiratory: Negative.   Cardiovascular: Negative.    Gastrointestinal: Negative.   Endocrine: Negative.   Genitourinary: Negative.   Musculoskeletal: Positive for arthralgias.  Allergic/Immunologic: Negative.   Neurological: Negative.   Hematological: Negative.   Psychiatric/Behavioral: Negative.     Objective:  Physical Exam  Vitals reviewed. Constitutional: She is oriented to person, place, and time. She appears well-developed and well-nourished.  HENT:  Head: Normocephalic and atraumatic.  Eyes: Pupils are equal, round, and reactive to light. Conjunctivae and EOM are normal.  Neck: No thyromegaly present.  Cardiovascular: Normal rate, regular rhythm and intact distal pulses.  Respiratory: Effort normal. No respiratory distress.  GI: Soft. She exhibits no distension.  Genitourinary:    Genitourinary Comments: deferred   Musculoskeletal:     Cervical back: Normal range of motion.  Left knee: Swelling, effusion and bony tenderness present. Decreased range of motion. Tenderness present over the medial joint line and lateral joint line.  Neurological: She is alert and oriented to person, place, and time. She has normal reflexes.  Skin: Skin is warm and dry.  Psychiatric: She has a normal mood and affect. Her behavior is normal. Judgment and thought content normal.    Vital signs in last 24 hours: @VSRANGES @  Labs:   Estimated body mass index is 41.23 kg/m as calculated from the following:   Height as of 09/10/19: 5' (1.524 m).   Weight as of 09/10/19: 95.8 kg.   Imaging Review Plain radiographs demonstrate severe degenerative joint disease of the left knee(s). The overall alignment issignificant varus. The bone quality appears to be adequate for age and reported activity level.      Assessment/Plan:  End stage arthritis, left knee   The patient history, physical examination, clinical judgment of the provider and imaging studies are consistent with end stage degenerative joint disease of the left knee(s) and total  knee arthroplasty is deemed medically necessary. The treatment options including medical management, injection therapy arthroscopy and arthroplasty were discussed at length. The risks and benefits of total knee arthroplasty were presented and reviewed. The risks due to aseptic loosening, infection, stiffness, patella tracking problems, thromboembolic complications and other imponderables were discussed. The patient acknowledged the explanation, agreed to proceed with the plan and consent was signed. Patient is being admitted for inpatient treatment for surgery, pain control, PT, OT, prophylactic antibiotics, VTE prophylaxis, progressive ambulation and ADL's and discharge planning. The patient is planning to be discharged home with OPPT   Desires o/n stay.    Patient's anticipated LOS is less than 2 midnights, meeting these requirements: - Younger than 63 - Lives within 1 hour of care - Has a competent adult at home to recover with post-op recover - NO history of  - Chronic pain requiring opiods  - Diabetes  - Coronary Artery Disease  - Heart failure  - Heart attack  - Stroke  - DVT/VTE  - Cardiac arrhythmia  - Respiratory Failure/COPD  - Renal failure  - Anemia  - Advanced Liver disease

## 2019-09-15 ENCOUNTER — Other Ambulatory Visit: Payer: Self-pay | Admitting: *Deleted

## 2019-09-15 MED ORDER — ATORVASTATIN CALCIUM 20 MG PO TABS: 20.0000 mg | ORAL_TABLET | Freq: Every day | ORAL | 0 refills | Status: DC

## 2019-09-16 MED ORDER — VANCOMYCIN HCL 1500 MG/300ML IV SOLN
1500.0000 mg | INTRAVENOUS | Status: AC
  Administered 2019-09-17: 1500 mg via INTRAVENOUS
  Filled 2019-09-16: qty 300

## 2019-09-16 NOTE — Anesthesia Preprocedure Evaluation (Addendum)
Anesthesia Evaluation  Patient identified by MRN, date of birth, ID band Patient awake    Reviewed: Allergy & Precautions, NPO status , Patient's Chart, lab work & pertinent test results  Airway Mallampati: II  TM Distance: >3 FB Neck ROM: Full    Dental no notable dental hx. (+) Teeth Intact, Dental Advisory Given   Pulmonary sleep apnea ,    Pulmonary exam normal breath sounds clear to auscultation       Cardiovascular hypertension, Pt. on medications Normal cardiovascular exam Rhythm:Regular Rate:Normal  SR w RBBB   Neuro/Psych  Headaches, Anxiety negative psych ROS   GI/Hepatic negative GI ROS, Neg liver ROS,   Endo/Other  negative endocrine ROS  Renal/GU negative Renal ROSK+ 3.8 Cr 0.79     Musculoskeletal  (+) Arthritis ,   Abdominal   Peds  Hematology negative hematology ROS (+) Hgb 14.1   Anesthesia Other Findings   Reproductive/Obstetrics                            Anesthesia Physical Anesthesia Plan  ASA: III  Anesthesia Plan: Spinal and Regional   Post-op Pain Management:    Induction: Intravenous  PONV Risk Score and Plan: 3 and Treatment may vary due to age or medical condition, Ondansetron and Dexamethasone  Airway Management Planned: Nasal Cannula and Natural Airway  Additional Equipment: None  Intra-op Plan:   Post-operative Plan:   Informed Consent: I have reviewed the patients History and Physical, chart, labs and discussed the procedure including the risks, benefits and alternatives for the proposed anesthesia with the patient or authorized representative who has indicated his/her understanding and acceptance.     Dental advisory given  Plan Discussed with:   Anesthesia Plan Comments: (Spinal w L adductor canal block)       Anesthesia Quick Evaluation

## 2019-09-17 ENCOUNTER — Ambulatory Visit (HOSPITAL_COMMUNITY)
Admission: RE | Admit: 2019-09-17 | Discharge: 2019-09-18 | Disposition: A | Discharge status: Home / Self Care | Payer: 59 | Attending: Orthopedic Surgery | Admitting: Orthopedic Surgery

## 2019-09-17 ENCOUNTER — Ambulatory Visit (HOSPITAL_COMMUNITY): Payer: 59

## 2019-09-17 ENCOUNTER — Encounter (HOSPITAL_COMMUNITY): Payer: Self-pay | Admitting: Orthopedic Surgery

## 2019-09-17 ENCOUNTER — Other Ambulatory Visit: Payer: Self-pay

## 2019-09-17 ENCOUNTER — Ambulatory Visit (HOSPITAL_COMMUNITY): Payer: 59 | Admitting: Anesthesiology

## 2019-09-17 ENCOUNTER — Encounter (HOSPITAL_COMMUNITY)
Admission: RE | Disposition: A | Discharge status: Home / Self Care | Payer: Self-pay | Source: Home / Self Care | Attending: Orthopedic Surgery

## 2019-09-17 DIAGNOSIS — Z823 Family history of stroke: Secondary | ICD-10-CM | POA: Diagnosis not present

## 2019-09-17 DIAGNOSIS — I10 Essential (primary) hypertension: Secondary | ICD-10-CM | POA: Diagnosis not present

## 2019-09-17 DIAGNOSIS — Z8249 Family history of ischemic heart disease and other diseases of the circulatory system: Secondary | ICD-10-CM | POA: Diagnosis not present

## 2019-09-17 DIAGNOSIS — E559 Vitamin D deficiency, unspecified: Secondary | ICD-10-CM | POA: Insufficient documentation

## 2019-09-17 DIAGNOSIS — Z79899 Other long term (current) drug therapy: Secondary | ICD-10-CM | POA: Diagnosis not present

## 2019-09-17 DIAGNOSIS — Z809 Family history of malignant neoplasm, unspecified: Secondary | ICD-10-CM | POA: Insufficient documentation

## 2019-09-17 DIAGNOSIS — Z6841 Body Mass Index (BMI) 40.0 and over, adult: Secondary | ICD-10-CM | POA: Diagnosis not present

## 2019-09-17 DIAGNOSIS — Z87442 Personal history of urinary calculi: Secondary | ICD-10-CM | POA: Diagnosis not present

## 2019-09-17 DIAGNOSIS — G4733 Obstructive sleep apnea (adult) (pediatric): Secondary | ICD-10-CM | POA: Insufficient documentation

## 2019-09-17 DIAGNOSIS — Z8349 Family history of other endocrine, nutritional and metabolic diseases: Secondary | ICD-10-CM | POA: Diagnosis not present

## 2019-09-17 DIAGNOSIS — Z7982 Long term (current) use of aspirin: Secondary | ICD-10-CM | POA: Insufficient documentation

## 2019-09-17 DIAGNOSIS — G43909 Migraine, unspecified, not intractable, without status migrainosus: Secondary | ICD-10-CM | POA: Insufficient documentation

## 2019-09-17 DIAGNOSIS — Z888 Allergy status to other drugs, medicaments and biological substances status: Secondary | ICD-10-CM | POA: Diagnosis not present

## 2019-09-17 DIAGNOSIS — M1712 Unilateral primary osteoarthritis, left knee: Secondary | ICD-10-CM | POA: Diagnosis present

## 2019-09-17 DIAGNOSIS — Z96652 Presence of left artificial knee joint: Secondary | ICD-10-CM

## 2019-09-17 DIAGNOSIS — Z880 Allergy status to penicillin: Secondary | ICD-10-CM | POA: Insufficient documentation

## 2019-09-17 DIAGNOSIS — I4510 Unspecified right bundle-branch block: Secondary | ICD-10-CM | POA: Insufficient documentation

## 2019-09-17 DIAGNOSIS — Z833 Family history of diabetes mellitus: Secondary | ICD-10-CM | POA: Diagnosis not present

## 2019-09-17 DIAGNOSIS — Z90710 Acquired absence of both cervix and uterus: Secondary | ICD-10-CM | POA: Diagnosis not present

## 2019-09-17 DIAGNOSIS — G4700 Insomnia, unspecified: Secondary | ICD-10-CM | POA: Diagnosis not present

## 2019-09-17 DIAGNOSIS — F419 Anxiety disorder, unspecified: Secondary | ICD-10-CM | POA: Insufficient documentation

## 2019-09-17 HISTORY — PX: KNEE ARTHROPLASTY: SHX992

## 2019-09-17 LAB — TYPE AND SCREEN
ABO/RH(D): B POS
Antibody Screen: NEGATIVE

## 2019-09-17 SURGERY — ARTHROPLASTY, KNEE, TOTAL, USING IMAGELESS COMPUTER-ASSISTED NAVIGATION
Anesthesia: Regional | Site: Knee | Laterality: Left

## 2019-09-17 MED ORDER — ONDANSETRON HCL 4 MG/2ML IJ SOLN: 4.0000 mg | Freq: Once | INTRAMUSCULAR | Status: DC | PRN

## 2019-09-17 MED ORDER — SODIUM CHLORIDE 0.9% IV SOLUTION
INTRAVENOUS | Status: AC | PRN
  Administered 2019-09-17: 1000 mL

## 2019-09-17 MED ORDER — HYDROCODONE-ACETAMINOPHEN 5-325 MG PO TABS
1.0000 | ORAL_TABLET | ORAL | Status: DC | PRN
  Administered 2019-09-17: 2 via ORAL

## 2019-09-17 MED ORDER — SODIUM CHLORIDE 0.9% FLUSH
INTRAVENOUS | Status: DC | PRN
  Administered 2019-09-17: 30 mL

## 2019-09-17 MED ORDER — DILTIAZEM HCL ER COATED BEADS 120 MG PO CP24
120.0000 mg | ORAL_CAPSULE | Freq: Every day | ORAL | Status: DC
  Administered 2019-09-18: 120 mg via ORAL
  Filled 2019-09-17: qty 1

## 2019-09-17 MED ORDER — MIDAZOLAM HCL 2 MG/2ML IJ SOLN
INTRAMUSCULAR | Status: DC | PRN
  Administered 2019-09-17: 2 mg via INTRAVENOUS

## 2019-09-17 MED ORDER — BUPIVACAINE HCL 0.25 % IJ SOLN
INTRAMUSCULAR | Status: AC
  Filled 2019-09-17: qty 1

## 2019-09-17 MED ORDER — MORPHINE SULFATE (PF) 2 MG/ML IV SOLN
0.5000 mg | INTRAVENOUS | Status: DC | PRN
  Filled 2019-09-17: qty 1

## 2019-09-17 MED ORDER — ROPIVACAINE HCL 7.5 MG/ML IJ SOLN
INTRAMUSCULAR | Status: DC | PRN
Start: 2019-09-17 — End: 2019-09-17
  Administered 2019-09-17: 20 mL via PERINEURAL

## 2019-09-17 MED ORDER — POLYETHYLENE GLYCOL 3350 17 G PO PACK: 17.0000 g | PACK | Freq: Every day | ORAL | Status: DC | PRN

## 2019-09-17 MED ORDER — DEXAMETHASONE SODIUM PHOSPHATE 10 MG/ML IJ SOLN: 10.0000 mg | Freq: Once | INTRAMUSCULAR | Status: DC

## 2019-09-17 MED ORDER — HYDROCHLOROTHIAZIDE 12.5 MG PO CAPS
12.5000 mg | ORAL_CAPSULE | Freq: Every day | ORAL | Status: DC
  Administered 2019-09-17 – 2019-09-18 (×2): 12.5 mg via ORAL
  Filled 2019-09-17 (×2): qty 1

## 2019-09-17 MED ORDER — CLONIDINE HCL (ANALGESIA) 100 MCG/ML EP SOLN
EPIDURAL | Status: DC | PRN
  Administered 2019-09-17: 100 ug

## 2019-09-17 MED ORDER — METHOCARBAMOL 500 MG IVPB - SIMPLE MED
INTRAVENOUS | Status: AC
  Filled 2019-09-17: qty 50

## 2019-09-17 MED ORDER — MEPERIDINE HCL 50 MG/ML IJ SOLN: 6.2500 mg | INTRAMUSCULAR | Status: DC | PRN

## 2019-09-17 MED ORDER — DOCUSATE SODIUM 100 MG PO CAPS
100.0000 mg | ORAL_CAPSULE | Freq: Two times a day (BID) | ORAL | Status: DC
  Administered 2019-09-17 – 2019-09-18 (×2): 100 mg via ORAL
  Filled 2019-09-17 (×2): qty 1

## 2019-09-17 MED ORDER — IRRISEPT - 450ML BOTTLE WITH 0.05% CHG IN STERILE WATER, USP 99.95% OPTIME
TOPICAL | Status: DC | PRN
  Administered 2019-09-17: 450 mL

## 2019-09-17 MED ORDER — TRANEXAMIC ACID-NACL 1000-0.7 MG/100ML-% IV SOLN
1000.0000 mg | INTRAVENOUS | Status: AC
  Administered 2019-09-17: 1000 mg via INTRAVENOUS
  Filled 2019-09-17: qty 100

## 2019-09-17 MED ORDER — MIDAZOLAM HCL 2 MG/2ML IJ SOLN
INTRAMUSCULAR | Status: AC
  Filled 2019-09-17: qty 2

## 2019-09-17 MED ORDER — STERILE WATER FOR IRRIGATION IR SOLN
Status: DC | PRN
  Administered 2019-09-17: 1000 mL

## 2019-09-17 MED ORDER — OXYCODONE HCL 5 MG PO TABS: 5.0000 mg | ORAL_TABLET | Freq: Once | ORAL | Status: DC | PRN

## 2019-09-17 MED ORDER — ALUM & MAG HYDROXIDE-SIMETH 200-200-20 MG/5ML PO SUSP: 30.0000 mL | ORAL | Status: DC | PRN

## 2019-09-17 MED ORDER — BUPIVACAINE HCL (PF) 0.25 % IJ SOLN
INTRAMUSCULAR | Status: DC | PRN
  Administered 2019-09-17: 30 mL

## 2019-09-17 MED ORDER — OXYCODONE HCL 5 MG/5ML PO SOLN: 5.0000 mg | Freq: Once | ORAL | Status: DC | PRN

## 2019-09-17 MED ORDER — SODIUM CHLORIDE 0.9 % IV SOLN: INTRAVENOUS | Status: DC

## 2019-09-17 MED ORDER — PHENYLEPHRINE HCL (PRESSORS) 10 MG/ML IV SOLN
INTRAVENOUS | Status: AC
  Filled 2019-09-17: qty 1

## 2019-09-17 MED ORDER — ONDANSETRON HCL 4 MG PO TABS: 4.0000 mg | ORAL_TABLET | Freq: Four times a day (QID) | ORAL | Status: DC | PRN

## 2019-09-17 MED ORDER — KETOROLAC TROMETHAMINE 30 MG/ML IJ SOLN
INTRAMUSCULAR | Status: AC
  Filled 2019-09-17: qty 1

## 2019-09-17 MED ORDER — ASPIRIN 81 MG PO CHEW
81.0000 mg | CHEWABLE_TABLET | Freq: Two times a day (BID) | ORAL | Status: DC
  Administered 2019-09-17 – 2019-09-18 (×2): 81 mg via ORAL
  Filled 2019-09-17 (×2): qty 1

## 2019-09-17 MED ORDER — METHOCARBAMOL 500 MG PO TABS
500.0000 mg | ORAL_TABLET | Freq: Four times a day (QID) | ORAL | Status: DC | PRN
  Administered 2019-09-18: 500 mg via ORAL
  Filled 2019-09-17: qty 1

## 2019-09-17 MED ORDER — ISOPROPYL ALCOHOL 70 % SOLN
Status: AC
  Filled 2019-09-17: qty 480

## 2019-09-17 MED ORDER — ACETAMINOPHEN 10 MG/ML IV SOLN
1000.0000 mg | INTRAVENOUS | Status: AC
  Administered 2019-09-17: 1000 mg via INTRAVENOUS
  Filled 2019-09-17: qty 100

## 2019-09-17 MED ORDER — HYDROCODONE-ACETAMINOPHEN 5-325 MG PO TABS
ORAL_TABLET | ORAL | Status: AC
  Filled 2019-09-17: qty 2

## 2019-09-17 MED ORDER — MENTHOL 3 MG MT LOZG: 1.0000 | LOZENGE | OROMUCOSAL | Status: DC | PRN

## 2019-09-17 MED ORDER — METHOCARBAMOL 500 MG IVPB - SIMPLE MED
500.0000 mg | Freq: Four times a day (QID) | INTRAVENOUS | Status: DC | PRN
  Administered 2019-09-17: 500 mg via INTRAVENOUS
  Filled 2019-09-17: qty 50

## 2019-09-17 MED ORDER — POVIDONE-IODINE 10 % EX SWAB
2.0000 "application " | Freq: Once | CUTANEOUS | Status: AC
  Administered 2019-09-17: 2 via TOPICAL

## 2019-09-17 MED ORDER — LACTATED RINGERS IV SOLN: INTRAVENOUS | Status: DC | PRN

## 2019-09-17 MED ORDER — FENTANYL CITRATE (PF) 100 MCG/2ML IJ SOLN
INTRAMUSCULAR | Status: DC | PRN
  Administered 2019-09-17: 100 ug via INTRAVENOUS

## 2019-09-17 MED ORDER — EPHEDRINE SULFATE-NACL 50-0.9 MG/10ML-% IV SOSY
PREFILLED_SYRINGE | INTRAVENOUS | Status: DC | PRN
  Administered 2019-09-17 (×3): 10 mg via INTRAVENOUS

## 2019-09-17 MED ORDER — PHENYLEPHRINE 40 MCG/ML (10ML) SYRINGE FOR IV PUSH (FOR BLOOD PRESSURE SUPPORT)
PREFILLED_SYRINGE | INTRAVENOUS | Status: DC | PRN
  Administered 2019-09-17 (×2): 80 ug via INTRAVENOUS

## 2019-09-17 MED ORDER — ATORVASTATIN CALCIUM 20 MG PO TABS
20.0000 mg | ORAL_TABLET | Freq: Every day | ORAL | Status: DC
  Administered 2019-09-17: 20 mg via ORAL
  Filled 2019-09-17: qty 1

## 2019-09-17 MED ORDER — PROPOFOL 500 MG/50ML IV EMUL
INTRAVENOUS | Status: DC | PRN
  Administered 2019-09-17: 60 mg via INTRAVENOUS
  Administered 2019-09-17: 100 ug/kg/min via INTRAVENOUS

## 2019-09-17 MED ORDER — BUPIVACAINE IN DEXTROSE 0.75-8.25 % IT SOLN
INTRATHECAL | Status: DC | PRN
  Administered 2019-09-17: 1.8 mL via INTRATHECAL

## 2019-09-17 MED ORDER — PHENOL 1.4 % MT LIQD: 1.0000 | OROMUCOSAL | Status: DC | PRN

## 2019-09-17 MED ORDER — HYDROMORPHONE HCL 1 MG/ML IJ SOLN: 0.2500 mg | INTRAMUSCULAR | Status: DC | PRN

## 2019-09-17 MED ORDER — LIDOCAINE 2% (20 MG/ML) 5 ML SYRINGE
INTRAMUSCULAR | Status: AC
  Filled 2019-09-17: qty 5

## 2019-09-17 MED ORDER — 0.9 % SODIUM CHLORIDE (POUR BTL) OPTIME
TOPICAL | Status: DC | PRN
  Administered 2019-09-17: 1000 mL

## 2019-09-17 MED ORDER — KETOROLAC TROMETHAMINE 30 MG/ML IJ SOLN
INTRAMUSCULAR | Status: DC | PRN
  Administered 2019-09-17: 30 mg

## 2019-09-17 MED ORDER — METOCLOPRAMIDE HCL 5 MG PO TABS: 5.0000 mg | ORAL_TABLET | Freq: Three times a day (TID) | ORAL | Status: DC | PRN

## 2019-09-17 MED ORDER — ACETAMINOPHEN 325 MG PO TABS: 325.0000 mg | ORAL_TABLET | Freq: Four times a day (QID) | ORAL | Status: DC | PRN

## 2019-09-17 MED ORDER — DEXAMETHASONE SODIUM PHOSPHATE 10 MG/ML IJ SOLN
INTRAMUSCULAR | Status: DC | PRN
  Administered 2019-09-17: 10 mg via INTRAVENOUS

## 2019-09-17 MED ORDER — ONDANSETRON HCL 4 MG/2ML IJ SOLN: 4.0000 mg | Freq: Four times a day (QID) | INTRAMUSCULAR | Status: DC | PRN

## 2019-09-17 MED ORDER — DIPHENHYDRAMINE HCL 12.5 MG/5ML PO ELIX: 12.5000 mg | ORAL_SOLUTION | ORAL | Status: DC | PRN

## 2019-09-17 MED ORDER — SODIUM CHLORIDE 0.9 % IR SOLN
Status: DC | PRN
  Administered 2019-09-17: 3000 mL

## 2019-09-17 MED ORDER — FENTANYL CITRATE (PF) 100 MCG/2ML IJ SOLN
INTRAMUSCULAR | Status: AC
  Filled 2019-09-17: qty 2

## 2019-09-17 MED ORDER — PROPOFOL 1000 MG/100ML IV EMUL
INTRAVENOUS | Status: AC
  Filled 2019-09-17: qty 100

## 2019-09-17 MED ORDER — ISOPROPYL ALCOHOL 70 % SOLN
Status: DC | PRN
  Administered 2019-09-17: 1 via TOPICAL

## 2019-09-17 MED ORDER — HYDROCODONE-ACETAMINOPHEN 7.5-325 MG PO TABS
1.0000 | ORAL_TABLET | ORAL | Status: DC | PRN
  Administered 2019-09-17 – 2019-09-18 (×4): 2 via ORAL
  Filled 2019-09-17 (×4): qty 2

## 2019-09-17 MED ORDER — VANCOMYCIN HCL IN DEXTROSE 1-5 GM/200ML-% IV SOLN
1000.0000 mg | Freq: Two times a day (BID) | INTRAVENOUS | Status: AC
  Administered 2019-09-17: 1000 mg via INTRAVENOUS
  Filled 2019-09-17: qty 200

## 2019-09-17 MED ORDER — CELECOXIB 200 MG PO CAPS
200.0000 mg | ORAL_CAPSULE | Freq: Two times a day (BID) | ORAL | Status: DC
  Administered 2019-09-17 – 2019-09-18 (×2): 200 mg via ORAL
  Filled 2019-09-17 (×2): qty 1

## 2019-09-17 MED ORDER — METOCLOPRAMIDE HCL 5 MG/ML IJ SOLN: 5.0000 mg | Freq: Three times a day (TID) | INTRAMUSCULAR | Status: DC | PRN

## 2019-09-17 MED ORDER — ACETAMINOPHEN 10 MG/ML IV SOLN: 1000.0000 mg | Freq: Once | INTRAVENOUS | Status: DC | PRN

## 2019-09-17 MED ORDER — SENNA 8.6 MG PO TABS
1.0000 | ORAL_TABLET | Freq: Two times a day (BID) | ORAL | Status: DC
  Administered 2019-09-17 – 2019-09-18 (×2): 8.6 mg via ORAL
  Filled 2019-09-17 (×2): qty 1

## 2019-09-17 MED ORDER — SODIUM CHLORIDE (PF) 0.9 % IJ SOLN
INTRAMUSCULAR | Status: AC
  Filled 2019-09-17: qty 50

## 2019-09-17 SURGICAL SUPPLY — 79 items
ADH SKN CLS APL DERMABOND .7 (GAUZE/BANDAGES/DRESSINGS) ×2
APL PRP STRL LF DISP 70% ISPRP (MISCELLANEOUS) ×2
BAG SPEC THK2 15X12 ZIP CLS (MISCELLANEOUS)
BAG ZIPLOCK 12X15 (MISCELLANEOUS) IMPLANT
BATTERY INSTRU NAVIGATION (MISCELLANEOUS) ×6 IMPLANT
BEARING TIBIA INSRT KNEE SZ4 9 (Insert) IMPLANT
BLADE SAW RECIPROCATING 77.5 (BLADE) ×2 IMPLANT
BNDG CMPR MD 5X2 ELC HKLP STRL (GAUZE/BANDAGES/DRESSINGS) ×1
BNDG CMPR MED 10X6 ELC LF (GAUZE/BANDAGES/DRESSINGS) ×1
BNDG ELASTIC 2 VLCR STRL LF (GAUZE/BANDAGES/DRESSINGS) ×1 IMPLANT
BNDG ELASTIC 4X5.8 VLCR STR LF (GAUZE/BANDAGES/DRESSINGS) ×2 IMPLANT
BNDG ELASTIC 6X10 VLCR STRL LF (GAUZE/BANDAGES/DRESSINGS) ×1 IMPLANT
BNDG ELASTIC 6X5.8 VLCR STR LF (GAUZE/BANDAGES/DRESSINGS) ×2 IMPLANT
BSPLAT TIB 4 KN TRITANIUM (Knees) ×1 IMPLANT
BTRY SRG DRVR LF (MISCELLANEOUS) ×3
CHLORAPREP W/TINT 26 (MISCELLANEOUS) ×4 IMPLANT
COVER SURGICAL LIGHT HANDLE (MISCELLANEOUS) ×2 IMPLANT
COVER WAND RF STERILE (DRAPES) IMPLANT
CUFF TOURN SGL QUICK 34 (TOURNIQUET CUFF) ×2
CUFF TRNQT CYL 34X4.125X (TOURNIQUET CUFF) ×1 IMPLANT
DECANTER SPIKE VIAL GLASS SM (MISCELLANEOUS) ×4 IMPLANT
DERMABOND ADVANCED (GAUZE/BANDAGES/DRESSINGS) ×2
DERMABOND ADVANCED .7 DNX12 (GAUZE/BANDAGES/DRESSINGS) ×2 IMPLANT
DRAPE SHEET LG 3/4 BI-LAMINATE (DRAPES) ×6 IMPLANT
DRAPE U-SHAPE 47X51 STRL (DRAPES) ×2 IMPLANT
DRSG AQUACEL AG ADV 3.5X10 (GAUZE/BANDAGES/DRESSINGS) ×2 IMPLANT
DRSG TEGADERM 4X4.75 (GAUZE/BANDAGES/DRESSINGS) IMPLANT
ELECT BLADE TIP CTD 4 INCH (ELECTRODE) ×2 IMPLANT
ELECT REM PT RETURN 15FT ADLT (MISCELLANEOUS) ×2 IMPLANT
EVACUATOR 1/8 PVC DRAIN (DRAIN) IMPLANT
FEMORAL RETAINING CRUC KNEE #4 (Knees) ×1 IMPLANT
GAUZE SPONGE 4X4 12PLY STRL (GAUZE/BANDAGES/DRESSINGS) ×2 IMPLANT
GLOVE BIO SURGEON STRL SZ8.5 (GLOVE) ×4 IMPLANT
GLOVE BIOGEL PI IND STRL 8.5 (GLOVE) ×1 IMPLANT
GLOVE BIOGEL PI INDICATOR 8.5 (GLOVE) ×1
GOWN SPEC L3 XXLG W/TWL (GOWN DISPOSABLE) ×2 IMPLANT
HANDPIECE INTERPULSE COAX TIP (DISPOSABLE) ×2
HOLDER FOLEY CATH W/STRAP (MISCELLANEOUS) ×2 IMPLANT
HOOD PEEL AWAY FLYTE STAYCOOL (MISCELLANEOUS) ×6 IMPLANT
JET LAVAGE IRRISEPT WOUND (IRRIGATION / IRRIGATOR) ×2
KIT TURNOVER KIT A (KITS) IMPLANT
KNEE PATELLA ASYMMETRIC 10X32 (Knees) ×1 IMPLANT
KNEE TIBIAL COMP TRI SZ4 (Knees) ×1 IMPLANT
LAVAGE JET IRRISEPT WOUND (IRRIGATION / IRRIGATOR) ×1 IMPLANT
MARKER SKIN DUAL TIP RULER LAB (MISCELLANEOUS) ×2 IMPLANT
NDL SAFETY ECLIPSE 18X1.5 (NEEDLE) ×1 IMPLANT
NDL SPNL 18GX3.5 QUINCKE PK (NEEDLE) ×1 IMPLANT
NEEDLE HYPO 18GX1.5 SHARP (NEEDLE) ×2
NEEDLE SPNL 18GX3.5 QUINCKE PK (NEEDLE) ×2 IMPLANT
NS IRRIG 1000ML POUR BTL (IV SOLUTION) ×2 IMPLANT
PACK TOTAL KNEE CUSTOM (KITS) ×2 IMPLANT
PADDING CAST COTTON 6X4 STRL (CAST SUPPLIES) ×2 IMPLANT
PADDING CAST SYN 6 (CAST SUPPLIES) ×1
PADDING CAST SYNTHETIC 6X4 NS (CAST SUPPLIES) IMPLANT
PENCIL SMOKE EVACUATOR (MISCELLANEOUS) IMPLANT
PIN FLUTED HEDLESS FIX 3.5X1/8 (PIN) ×2 IMPLANT
PROTECTOR NERVE ULNAR (MISCELLANEOUS) ×2 IMPLANT
SAW OSC TIP CART 19.5X105X1.3 (SAW) ×2 IMPLANT
SEALER BIPOLAR AQUA 6.0 (INSTRUMENTS) ×2 IMPLANT
SET HNDPC FAN SPRY TIP SCT (DISPOSABLE) ×1 IMPLANT
SET PAD KNEE POSITIONER (MISCELLANEOUS) ×2 IMPLANT
SPONGE DRAIN TRACH 4X4 STRL 2S (GAUZE/BANDAGES/DRESSINGS) IMPLANT
SUT MNCRL AB 3-0 PS2 18 (SUTURE) ×2 IMPLANT
SUT MNCRL AB 4-0 PS2 18 (SUTURE) ×2 IMPLANT
SUT MON AB 2-0 CT1 36 (SUTURE) ×2 IMPLANT
SUT STRATAFIX PDO 1 14 VIOLET (SUTURE) ×2
SUT STRATFX PDO 1 14 VIOLET (SUTURE) ×1
SUT VIC AB 1 CTX 36 (SUTURE) ×4
SUT VIC AB 1 CTX36XBRD ANBCTR (SUTURE) ×2 IMPLANT
SUT VIC AB 2-0 CT1 27 (SUTURE) ×2
SUT VIC AB 2-0 CT1 TAPERPNT 27 (SUTURE) ×1 IMPLANT
SUTURE STRATFX PDO 1 14 VIOLET (SUTURE) ×1 IMPLANT
SYR 3ML LL SCALE MARK (SYRINGE) ×2 IMPLANT
TIBIA BEAR INSERT KNEE SZ4 9 (Insert) ×2 IMPLANT
TOWER CARTRIDGE SMART MIX (DISPOSABLE) IMPLANT
TRAY FOLEY MTR SLVR 16FR STAT (SET/KITS/TRAYS/PACK) IMPLANT
WATER STERILE IRR 1000ML POUR (IV SOLUTION) ×4 IMPLANT
WRAP KNEE MAXI GEL POST OP (GAUZE/BANDAGES/DRESSINGS) ×2 IMPLANT
YANKAUER SUCT BULB TIP 10FT TU (MISCELLANEOUS) ×2 IMPLANT

## 2019-09-17 NOTE — Anesthesia Postprocedure Evaluation (Signed)
Anesthesia Post Note  Patient: ENORA TRILLO  Procedure(s) Performed: COMPUTER ASSISTED TOTAL KNEE ARTHROPLASTY (Left Knee)     Patient location during evaluation: Nursing Unit Anesthesia Type: Regional Level of consciousness: oriented and awake and alert Pain management: pain level controlled Vital Signs Assessment: post-procedure vital signs reviewed and stable Respiratory status: spontaneous breathing and respiratory function stable Cardiovascular status: blood pressure returned to baseline and stable Postop Assessment: no headache, no backache, no apparent nausea or vomiting and patient able to bend at knees Anesthetic complications: no    Last Vitals:  Vitals:   09/17/19 1145 09/17/19 1200  BP: 121/70 113/62  Pulse: 81 76  Resp: 20 16  Temp: (!) 36.4 C   SpO2: 100% 100%    Last Pain:  Vitals:   09/17/19 1200  TempSrc:   PainSc: 0-No pain                 Trevor Iha

## 2019-09-17 NOTE — Transfer of Care (Signed)
Immediate Anesthesia Transfer of Care Note  Patient: [name] [name]  Procedure(s) Performed: COMPUTER ASSISTED TOTAL KNEE ARTHROPLASTY (Left Knee)  Patient Location: PACU  Anesthesia Type:MAC and Spinal  Level of Consciousness: awake, alert , oriented and patient cooperative  Airway & Oxygen Therapy: Patient Spontanous Breathing and Patient connected to face mask oxygen  Post-op Assessment: Report given to RN and Post -op Vital signs reviewed and stable  Post vital signs: Reviewed and stable  Last Vitals:  Vitals Value Taken Time  BP 112/93 09/17/19 1045  Temp    Pulse 97 09/17/19 1047  Resp 14 09/17/19 1047  SpO2 100 % 09/17/19 1047  Vitals shown include unvalidated device data.  Last Pain:  Vitals:   09/17/19 0623  TempSrc:   PainSc: 7          Complications: No apparent anesthesia complications

## 2019-09-17 NOTE — Interval H&P Note (Signed)
History and Physical Interval Note:  09/17/2019 7:29 AM  [name] [name]  has presented today for surgery, with the diagnosis of degenerative joint disease left knee.  The various methods of treatment have been discussed with the patient and family. After consideration of risks, benefits and other options for treatment, the patient has consented to  Procedure(s): COMPUTER ASSISTED TOTAL KNEE ARTHROPLASTY (Left) as a surgical intervention.  The patient's history has been reviewed, patient examined, no change in status, stable for surgery.  I have reviewed the patient's chart and labs.  Questions were answered to the patient's satisfaction.    The risks, benefits, and alternatives were discussed with the patient. There are risks associated with the surgery including, but not limited to, problems with anesthesia (death), infection, instability (giving out of the joint), dislocation, differences in leg length/angulation/rotation, fracture of bones, loosening or failure of implants, hematoma (blood accumulation) which may require surgical drainage, blood clots, pulmonary embolism, nerve injury (foot drop and lateral thigh numbness), and blood vessel injury. The patient understands these risks and elects to proceed.    [name] [name]

## 2019-09-17 NOTE — Discharge Instructions (Signed)
° °Dr. Dymir Neeson °Total Joint Specialist °[facility] °[address] °[place], [place] [address] °[phone] ° °TOTAL KNEE REPLACEMENT POSTOPERATIVE DIRECTIONS ° ° ° °Knee Rehabilitation, Guidelines Following Surgery  °Results after knee surgery are often greatly improved when you follow the exercise, range of motion and muscle strengthening exercises prescribed by your doctor. Safety measures are also important to protect the knee from further injury. Any time any of these exercises cause you to have increased pain or swelling in your knee joint, decrease the amount until you are comfortable again and slowly increase them. If you have problems or questions, call your caregiver or physical therapist for advice.  ° °WEIGHT BEARING °Weight bearing as tolerated with assist device (walker, cane, etc) as directed, use it as long as suggested by your surgeon or therapist, typically at least 4-6 weeks. ° °HOME CARE INSTRUCTIONS  °Remove items at home which could result in a fall. This includes throw rugs or furniture in walking pathways.  °Continue medications as instructed at time of discharge. °You may have some home medications which will be placed on hold until you complete the course of blood thinner medication.  °You may start showering once you are discharged home but do not submerge the incision under water. Just pat the incision dry and apply a dry gauze dressing on daily. °Walk with walker as instructed.  °You may resume a sexual relationship in one month or when given the OK by your doctor.  °· Use walker as long as suggested by your caregivers. °· Avoid periods of inactivity such as sitting longer than an hour when not asleep. This helps prevent blood clots.  °You may put full weight on your legs and walk as much as is comfortable.  °You may return to work once you are cleared by your doctor.  °Do not drive a car for 6 weeks or until released by you surgeon.  °· Do not drive  while taking narcotics.  °Wear the elastic stockings for three weeks following surgery during the day but you may remove then at night. °Make sure you keep all of your appointments after your operation with all of your doctors and caregivers. You should call the office at the above phone number and make an appointment for approximately two weeks after the date of your surgery. °Do not remove your surgical dressing. The dressing is waterproof; you may take showers in 3 days, but do not take tub baths or submerge the dressing. °Please pick up a stool softener and laxative for home use as long as you are requiring pain medications. °· ICE to the affected knee every three hours for 30 minutes at a time and then as needed for pain and swelling.  Continue to use ice on the knee for pain and swelling from surgery. You may notice swelling that will progress down to the foot and ankle.  This is normal after surgery.  Elevate the leg when you are not up walking on it.   °It is important for you to complete the blood thinner medication as prescribed by your doctor. °· Continue to use the breathing machine which will help keep your temperature down.  It is common for your temperature to cycle up and down following surgery, especially at night when you are not up moving around and exerting yourself.  The breathing machine keeps your lungs expanded and your temperature down. ° °RANGE OF MOTION AND STRENGTHENING EXERCISES  °Rehabilitation of the knee is important following   a knee injury or an operation. After just a few days of immobilization, the muscles of the thigh which control the knee become weakened and shrink (atrophy). Knee exercises are designed to build up the tone and strength of the thigh muscles and to improve knee motion. Often times heat used for twenty to thirty minutes before working out will loosen up your tissues and help with improving the range of motion but do not use heat for the first two weeks following  surgery. These exercises can be done on a training (exercise) mat, on the floor, on a table or on a bed. Use what ever works the best and is most comfortable for you Knee exercises include:  °Leg Lifts - While your knee is still immobilized in a splint or cast, you can do straight leg raises. Lift the leg to 60 degrees, hold for 3 sec, and slowly lower the leg. Repeat 10-20 times 2-3 times daily. Perform this exercise against resistance later as your knee gets better.  °Quad and Hamstring Sets - Tighten up the muscle on the front of the thigh (Quad) and hold for 5-10 sec. Repeat this 10-20 times hourly. Hamstring sets are done by pushing the foot backward against an object and holding for 5-10 sec. Repeat as with quad sets.  °A rehabilitation program following serious knee injuries can speed recovery and prevent re-injury in the future due to weakened muscles. Contact your doctor or a physical therapist for more information on knee rehabilitation.  ° °SKILLED REHAB INSTRUCTIONS: °If the patient is transferred to a skilled rehab facility following release from the hospital, a list of the current medications will be sent to the facility for the patient to continue.  When discharged from the skilled rehab facility, please have the facility set up the patient's Home Health Physical Therapy prior to being released. Also, the skilled facility will be responsible for providing the patient with their medications at time of release from the facility to include their pain medication, the muscle relaxants, and their blood thinner medication. If the patient is still at the rehab facility at time of the two week follow up appointment, the skilled rehab facility will also need to assist the patient in arranging follow up appointment in our office and any transportation needs. ° °MAKE SURE YOU:  °Understand these instructions.  °Will watch your condition.  °Will get help right away if you are not doing well or get worse.   ° ° °Pick up stool softner and laxative for home use following surgery while on pain medications. °Do NOT remove your dressing. You may shower.  °Do not take tub baths or submerge incision under water. °May shower starting three days after surgery. °Please use a clean towel to pat the incision dry following showers. °Continue to use ice for pain and swelling after surgery. °Do not use any lotions or creams on the incision until instructed by your surgeon. ° °

## 2019-09-17 NOTE — Evaluation (Signed)
Physical Therapy Evaluation Patient Details Name: [name] [name] MRN: [id number] DOB: [birth year] Today's Date: 09/17/2019   History of Present Illness  Pt s/p L TKR and with hx of R TKR (2020)   Clinical Impression  Pt s/p L TKR and presents with decreased L LE strength/ROM and post op pain limiting functional mobility.  Pt should progress to dc home with assist of friends and reports first OP PT appt 09/23/19.    Follow Up Recommendations Follow surgeon's recommendation for DC plan and follow-up therapies    Equipment Recommendations  None recommended by PT    Recommendations for Other Services       Precautions / Restrictions Precautions Precautions: Knee;Fall Restrictions Weight Bearing Restrictions: No Other Position/Activity Restrictions: WBAT      Mobility  Bed Mobility Overal bed mobility: Needs Assistance Bed Mobility: Supine to Sit     Supine to sit: Min assist     General bed mobility comments: min cues for sequence and use of R LE to self assist  Transfers Overall transfer level: Needs assistance Equipment used: Rolling walker (2 wheeled) Transfers: Sit to/from Stand Sit to Stand: Min assist         General transfer comment: cues for LE management and use of UEs to self assist  Ambulation/Gait Ambulation/Gait assistance: Min assist Gait Distance (Feet): 45 Feet Assistive device: Rolling walker (2 wheeled) Gait Pattern/deviations: Step-to pattern;Decreased step length - right;Decreased step length - left;Shuffle;Trunk flexed Gait velocity: decr   General Gait Details: cues for posture, position from RW and initial sequence  Stairs            Wheelchair Mobility    Modified Rankin (Stroke Patients Only)       Balance Overall balance assessment: Mild deficits observed, not formally tested                                           Pertinent Vitals/Pain Pain Assessment: 0-10 Pain Score: 5  Pain Location: L  knee Pain Descriptors / Indicators: Aching;Sore Pain Intervention(s): Limited activity within patient's tolerance;Monitored during session;Premedicated before session    [place] expects to be discharged to:: Private residence Living Arrangements: Non-relatives/Friends Available Help at Discharge: Friend(s) Type of Home: House Home Access: Stairs to enter   [employer] of Steps: 1 and 1 Home Layout: One level Home Equipment: Environmental consultant - 2 wheels;Cane - single point;Walker - 4 wheels      Prior Function Level of Independence: Independent               Hand Dominance   Dominant Hand: Right    Extremity/Trunk Assessment   Upper Extremity Assessment Upper Extremity Assessment: Overall WFL for tasks assessed    Lower Extremity Assessment Lower Extremity Assessment: LLE deficits/detail    Cervical / Trunk Assessment Cervical / Trunk Assessment: Normal  Communication   Communication: No difficulties  Cognition Arousal/Alertness: Awake/alert Behavior During Therapy: WFL for tasks assessed/performed Overall Cognitive Status: Within Functional Limits for tasks assessed                                        General Comments      Exercises Total Joint Exercises Ankle Circles/Pumps: AROM;Both;15 reps;Supine   Assessment/Plan    PT Assessment Patient needs continued PT services  PT Problem List Decreased strength;Decreased range of motion;Decreased activity tolerance;Decreased mobility;Decreased knowledge of use of DME;Pain       PT Treatment Interventions DME instruction;Gait training;Stair training;Functional mobility training;Therapeutic activities;Therapeutic exercise;Patient/family education    PT Goals (Current goals can be found in the Care Plan section)  Acute Rehab PT Goals Patient Stated Goal: Regain IND and get back to doing what I want to do PT Goal Formulation: With patient Time For Goal Achievement:  09/24/19 Potential to Achieve Goals: Good    Frequency 7X/week   Barriers to discharge        Co-evaluation               AM-PAC PT "6 Clicks" Mobility  Outcome Measure Help needed turning from your back to your side while in a flat bed without using bedrails?: A Little Help needed moving from lying on your back to sitting on the side of a flat bed without using bedrails?: A Little Help needed moving to and from a bed to a chair (including a wheelchair)?: A Little Help needed standing up from a chair using your arms (e.g., wheelchair or bedside chair)?: A Little Help needed to walk in hospital room?: A Little Help needed climbing 3-5 steps with a railing? : A Little 6 Click Score: 18    End of Session Equipment Utilized During Treatment: Gait belt Activity Tolerance: Patient tolerated treatment well Patient left: in chair;with call bell/phone within reach;with chair alarm set Nurse Communication: Mobility status PT Visit Diagnosis: Difficulty in walking, not elsewhere classified (R26.2)    Time: 6010-9323 PT Time Calculation (min) (ACUTE ONLY): 25 min   Charges:   PT Evaluation $PT Eval Low Complexity: 1 Low PT Treatments $Gait Training: 8-22 mins        Mauro Kaufmann PT Acute Rehabilitation Services Pager [phone] Office [phone]   Vanellope Passmore 09/17/2019, 5:52 PM

## 2019-09-17 NOTE — Anesthesia Procedure Notes (Signed)
Spinal  Patient location during procedure: OR Start time: 09/17/2019 7:44 AM End time: 09/17/2019 7:49 AM Staffing Anesthesiologist: Trevor Iha, MD Preanesthetic Checklist Completed: patient identified, IV checked, site marked, risks and benefits discussed, surgical consent, monitors and equipment checked, pre-op evaluation and timeout performed Spinal Block Patient position: sitting Prep: ChloraPrep and DuraPrep Patient monitoring: heart rate Approach: midline Location: L3-4 Injection technique: single-shot Needle Needle type: Sprotte  Needle gauge: 24 G Needle length: 9 cm Assessment Sensory level: T4

## 2019-09-17 NOTE — Anesthesia Procedure Notes (Signed)
Anesthesia Regional Block: Adductor canal block   Pre-Anesthetic Checklist: ,, timeout performed, Correct Patient, Correct Site, Correct Laterality, Correct Procedure, Correct Position, site marked, Risks and benefits discussed,  Surgical consent,  Pre-op evaluation,  At surgeon's request and post-op pain management  Laterality: Lower and Left  Prep: chloraprep       Needles:  Injection technique: Single-shot  Needle Type: Echogenic Needle     Needle Length: 9cm  Needle Gauge: 22     Additional Needles:   Procedures:,,,, ultrasound used (permanent image in chart),,,,  Narrative:  Start time: 09/17/2019 7:06 AM End time: 09/17/2019 7:11 AM Injection made incrementally with aspirations every 5 mL.  Performed by: Personally  Anesthesiologist: Trevor Iha, MD  Additional Notes: Block assessed prior to surgery. Pt tolerated procedure well.

## 2019-09-17 NOTE — Op Note (Signed)
OPERATIVE REPORT  SURGEON: Rod Can, MD   ASSISTANT: Nehemiah Massed, PA-C  PREOPERATIVE DIAGNOSIS: Left knee arthritis.   POSTOPERATIVE DIAGNOSIS: Left knee arthritis.   PROCEDURE: Left total knee arthroplasty.   IMPLANTS: Stryker Triathlon CR femur, size 4. Stryker Tritanium tibia, size 4. X3 polyethelyene insert, size 9 mm, CR. 3 button asymmetric patella, size 32 mm.  ANESTHESIA:  MAC, Regional and Spinal  TOURNIQUET TIME: Not utilized.   ESTIMATED BLOOD LOSS:-190 mL    ANTIBIOTICS: 1 g Vancomycin.  DRAINS: None.  COMPLICATIONS: None   CONDITION: PACU - hemodynamically stable.   BRIEF CLINICAL NOTE: [name] [name] is a 59 y.o. female with a long-standing history of Left knee arthritis with stiffness. After failing conservative management, the patient was indicated for total knee arthroplasty. The risks, benefits, and alternatives to the procedure were explained, and the patient elected to proceed.  PROCEDURE IN DETAIL: Adductor canal block was obtained in the pre-op holding area. Once inside the operative room, spinal anesthesia was obtained, and a foley catheter was inserted. The patient was then positioned, a nonsterile tourniquet was placed, and the lower extremity was prepped and draped in the normal sterile surgical fashion.  A time-out was called verifying side and site of surgery. The patient received IV antibiotics within 60 minutes of beginning the procedure. The tourniquet was not utilized.   An anterior approach to the knee was performed utilizing a medial parapatellar arthrotomy. A medial release was performed and the patellar fat pad was excised. Stryker navigation was used to cut the distal femur perpendicular to the mechanical axis. A freehand patellar resection was performed, and the patella was sized an prepared with 3 lug holes.  Nagivation was used to  make a neutral proximal tibia resection, taking 9 mm of bone from the less affected lateral side with 3 degrees of slope. The menisci were excised. A spacer block was placed, and the alignment and balance in extension were confirmed.   The distal femur was sized using the 3-degree external rotation guide referencing the posterior femoral cortex. The appropriate 4-in-1 cutting block was pinned into place. Rotation was checked using Whiteside's line, the epicondylar axis, and then confirmed with a spacer block in flexion. The remaining femoral cuts were performed, taking care to protect the MCL.  The tibia was sized and the trial tray was pinned into place. The remaining trail components were inserted. The knee was stable to varus and valgus stress through a full range of motion. The patella tracked centrally, and the PCL was well balanced. The trial components were removed, and the proximal tibial surface was prepared. Final components were impacted into place. The knee was tested for a final time and found to be well balanced.   The wound was copiously irrigated with Irrisept solution and normal saline using pule lavage.  Marcaine solution was injected into the periarticular soft tissue.  The wound was closed in layers using #1 Vicryl and Stratafix for the fascia, 2-0 Vicryl for the subcutaneous fat, 2-0 Monocryl for the deep dermal layer, 3-0 running Monocryl subcuticular Stitch, and 4-0 Monocryl stay sutures at both ends of the wound. Dermabond was applied to the skin.  Once the glue was fully dried, an Aquacell Ag and compressive dressing were applied.  Tthe patient was transported to the recovery room in stable condition.  Sponge, needle, and instrument counts were correct at the end of the case x2.  The patient tolerated the procedure well and there were no known complications.  Please note that a surgical assistant was a medical necessity for this procedure in order to perform it in a safe and  expeditious manner. Surgical assistant was necessary to retract the ligaments and vital neurovascular structures to prevent injury to them and also necessary for proper positioning of the limb to allow for anatomic placement of the prosthesis.

## 2019-09-18 ENCOUNTER — Encounter: Payer: Self-pay | Admitting: *Deleted

## 2019-09-18 DIAGNOSIS — M1712 Unilateral primary osteoarthritis, left knee: Secondary | ICD-10-CM | POA: Diagnosis not present

## 2019-09-18 LAB — CBC
HCT: 34.2 % — ABNORMAL LOW (ref 36.0–46.0)
Hemoglobin: 10.8 g/dL — ABNORMAL LOW (ref 12.0–15.0)
MCH: 29 pg (ref 26.0–34.0)
MCHC: 31.6 g/dL (ref 30.0–36.0)
MCV: 91.7 fL (ref 80.0–100.0)
Platelets: 237 10*3/uL (ref 150–400)
RBC: 3.73 MIL/uL — ABNORMAL LOW (ref 3.87–5.11)
RDW: 13.9 % (ref 11.5–15.5)
WBC: 14.3 10*3/uL — ABNORMAL HIGH (ref 4.0–10.5)
nRBC: 0 % (ref 0.0–0.2)

## 2019-09-18 LAB — BASIC METABOLIC PANEL
Anion gap: 6 (ref 5–15)
BUN: 21 mg/dL — ABNORMAL HIGH (ref 6–20)
CO2: 26 mmol/L (ref 22–32)
Calcium: 8.1 mg/dL — ABNORMAL LOW (ref 8.9–10.3)
Chloride: 107 mmol/L (ref 98–111)
Creatinine, Ser: 0.8 mg/dL (ref 0.44–1.00)
GFR calc Af Amer: >60 mL/min (ref >60)
GFR calc non Af Amer: >60 mL/min (ref >60)
Glucose, Bld: 170 mg/dL — ABNORMAL HIGH (ref 70–99)
Potassium: 3.5 mmol/L (ref 3.5–5.1)
Sodium: 139 mmol/L (ref 135–145)

## 2019-09-18 MED ORDER — SENNA 8.6 MG PO TABS: 2.0000 | ORAL_TABLET | Freq: Every day | ORAL | 0 refills | Status: DC

## 2019-09-18 MED ORDER — HYDROCODONE-ACETAMINOPHEN 5-325 MG PO TABS: 1.0000 | ORAL_TABLET | ORAL | 0 refills | Status: DC | PRN

## 2019-09-18 MED ORDER — ONDANSETRON HCL 4 MG PO TABS: 4.0000 mg | ORAL_TABLET | Freq: Four times a day (QID) | ORAL | 0 refills | Status: DC | PRN

## 2019-09-18 MED ORDER — CELECOXIB 200 MG PO CAPS: 200.0000 mg | ORAL_CAPSULE | Freq: Every day | ORAL | 2 refills | Status: DC

## 2019-09-18 MED ORDER — ASPIRIN 81 MG PO CHEW: 81.0000 mg | CHEWABLE_TABLET | Freq: Two times a day (BID) | ORAL | 0 refills | Status: AC

## 2019-09-18 MED ORDER — DOCUSATE SODIUM 100 MG PO CAPS: 100.0000 mg | ORAL_CAPSULE | Freq: Two times a day (BID) | ORAL | 0 refills | Status: DC

## 2019-09-18 NOTE — Progress Notes (Signed)
Physical Therapy Treatment Patient Details Name: [name] [name] MRN: [id number] DOB: [birth year] Today's Date: 09/18/2019    History of Present Illness Pt s/p L TKR and with hx of R TKR (2020)     PT Comments    Pt performed HEP with assist.  Pt states she has all written instruction from recent R TKR and feels comfortable following through until first OP PT appt.  Will return after bfast for gait/stair training.   Follow Up Recommendations  Follow surgeon's recommendation for DC plan and follow-up therapies     Equipment Recommendations  None recommended by PT    Recommendations for Other Services       Precautions / Restrictions Precautions Precautions: Knee;Fall Restrictions Weight Bearing Restrictions: No Other Position/Activity Restrictions: WBAT    Mobility  Bed Mobility               General bed mobility comments: Pt up in chair and requests to stay for bfast  Transfers                    Ambulation/Gait                 Stairs             Wheelchair Mobility    Modified Rankin (Stroke Patients Only)       Balance                                            Cognition Arousal/Alertness: Awake/alert Behavior During Therapy: WFL for tasks assessed/performed Overall Cognitive Status: Within Functional Limits for tasks assessed                                        Exercises Total Joint Exercises Ankle Circles/Pumps: AROM;Both;15 reps;Supine Quad Sets: AROM;Both;Supine;10 reps Heel Slides: AAROM;Left;15 reps;Supine Straight Leg Raises: AAROM;AROM;Left;15 reps;Supine Long Arc Quad: AROM;Left;Seated;15 reps Goniometric ROM: AAROM L knee -5 - 50    General Comments        Pertinent Vitals/Pain Pain Assessment: 0-10 Pain Score: 5  Pain Location: L knee Pain Descriptors / Indicators: Aching;Sore Pain Intervention(s): Limited activity within patient's tolerance;Monitored during  session;Premedicated before session;Ice applied    Home Living                      Prior Function            PT Goals (current goals can now be found in the care plan section) Acute Rehab PT Goals Patient Stated Goal: Regain IND and get back to doing what I want to do PT Goal Formulation: With patient Time For Goal Achievement: 09/24/19 Potential to Achieve Goals: Good Progress towards PT goals: Progressing toward goals    Frequency    7X/week      PT Plan Current plan remains appropriate    Co-evaluation              AM-PAC PT "6 Clicks" Mobility   Outcome Measure  Help needed turning from your back to your side while in a flat bed without using bedrails?: A Little Help needed moving from lying on your back to sitting on the side of a flat bed without using bedrails?: A Little Help needed moving to and from a  bed to a chair (including a wheelchair)?: A Little Help needed standing up from a chair using your arms (e.g., wheelchair or bedside chair)?: A Little Help needed to walk in hospital room?: A Little Help needed climbing 3-5 steps with a railing? : A Little 6 Click Score: 18    End of Session   Activity Tolerance: Patient tolerated treatment well Patient left: in chair;with call bell/phone within reach;with chair alarm set Nurse Communication: Mobility status PT Visit Diagnosis: Difficulty in walking, not elsewhere classified (R26.2)     Time: 2025-4270 PT Time Calculation (min) (ACUTE ONLY): 18 min  Charges:  $Therapeutic Exercise: 8-22 mins                     [place] Pager [phone] Office [phone]    Maquita Sandoval 09/18/2019, 8:34 AM

## 2019-09-18 NOTE — Discharge Summary (Signed)
Physician Discharge Summary  Patient ID: [name] [name] MRN: [id number] DOB/AGE: 1960/08/11 59 y.o.  Admit date: 09/17/2019 Discharge date: 09/18/2019  Admission Diagnoses:  Osteoarthritis of left knee  Discharge Diagnoses:  Principal Problem:   Osteoarthritis of left knee Active Problems:   Degenerative arthritis of left knee   Past Medical History:  Diagnosis Date  . Anxiety 2008   seen in ED /w chest pain but told that she was having anxiety  . Arthritis   . History of kidney stones 1999  . History of migraine   . History of stress test 2010   done due to chest pain /w Dr. Einar Gip. Pt. told that it was wnl.  . Hives of unknown origin    told by PCP Allyson Sabal) to use Zrytec BID to control hives   . Hypertension   . Neuromuscular disorder ([place])    back pain  . Sleep difficulties    has had a sleep study - t old that she didn't meet criteria for machine   . Syncope    due to pain    Surgeries: Procedure(s): COMPUTER ASSISTED TOTAL KNEE ARTHROPLASTY on 09/17/2019   Consultants (if any):   Discharged Condition: Improved  Hospital Course: [name] [name] is an 59 y.o. female who was admitted 09/17/2019 with a diagnosis of Osteoarthritis of left knee and went to the operating room on 09/17/2019 and underwent the above named procedures.    She was given perioperative antibiotics:  Anti-infectives (From admission, onward)   Start     Dose/Rate Route Frequency Ordered Stop   09/17/19 1900  vancomycin (VANCOCIN) IVPB 1000 mg/200 mL premix     1,000 mg 200 mL/hr over 60 Minutes Intravenous Every 12 hours 09/17/19 1458 09/17/19 2120   09/17/19 0600  vancomycin (VANCOREADY) IVPB 1500 mg/300 mL     1,500 mg 150 mL/hr over 120 Minutes Intravenous On call to O.R. 09/16/19 0109 09/17/19 3235    .  She was given sequential compression devices, early ambulation, and ASA for DVT prophylaxis.  She benefited maximally from the hospital stay and there were no complications.    Recent  vital signs:  Vitals:   09/18/19 0131 09/18/19 0617  BP: (!) 116/54 138/73  Pulse: 71 69  Resp: 15 15  Temp: 97.7 F (36.5 C) (!) 97.5 F (36.4 C)  SpO2: 97% 99%    Recent laboratory studies:  Lab Results  Component Value Date   HGB 10.8 (L) 09/18/2019   HGB 14.1 09/10/2019   HGB 11.1 (L) 12/25/2018   Lab Results  Component Value Date   WBC 14.3 (H) 09/18/2019   PLT 237 09/18/2019   Lab Results  Component Value Date   INR 1.0 09/10/2019   Lab Results  Component Value Date   NA 139 09/18/2019   K 3.5 09/18/2019   CL 107 09/18/2019   CO2 26 09/18/2019   BUN 21 (H) 09/18/2019   CREATININE 0.80 09/18/2019   GLUCOSE 170 (H) 09/18/2019    Discharge Medications:   Allergies as of 09/18/2019      Reactions   Gabapentin    Dizziness   Other    American cockroaches   Loratadine Hives, Rash   Penicillins Hives, Rash   Did it involve swelling of the face/tongue/throat, SOB, or low BP? No Did it involve sudden or severe rash/hives, skin peeling, or any reaction on the inside of your mouth or nose? No Did you need to seek medical attention at a hospital  or doctor's office? Yes When did it last happen?Childhood allergy If all above answers are "NO", may proceed with cephalosporin use.      Medication List    STOP taking these medications   levETIRAcetam 750 MG tablet Commonly known as: KEPPRA   naproxen sodium 220 MG tablet Commonly known as: ALEVE   temazepam 15 MG capsule Commonly known as: RESTORIL     TAKE these medications   ALPRAZolam 0.5 MG tablet Commonly known as: Xanax Take 1 tablet (0.5 mg total) by mouth 3 (three) times daily as needed.   aspirin 81 MG chewable tablet Chew 1 tablet (81 mg total) by mouth 2 (two) times daily.   atorvastatin 20 MG tablet Commonly known as: LIPITOR Take 1 tablet (20 mg total) by mouth daily.   celecoxib 200 MG capsule Commonly known as: CELEBREX Take 1 capsule (200 mg total) by mouth daily.    cetirizine 10 MG tablet Commonly known as: ZYRTEC Take 10 mg by mouth daily.   diltiazem 120 MG 24 hr capsule Commonly known as: CARDIZEM CD TAKE 1 CAPSULE BY MOUTH EVERY DAY   docusate sodium 100 MG capsule Commonly known as: COLACE Take 1 capsule (100 mg total) by mouth 2 (two) times daily.   hydrochlorothiazide 12.5 MG capsule Commonly known as: MICROZIDE TAKE 1 CAPSULE BY MOUTH EVERY DAY What changed: how much to take   HYDROcodone-acetaminophen 5-325 MG tablet Commonly known as: NORCO/VICODIN Take 1 tablet by mouth every 4 (four) hours as needed for moderate pain (pain score 4-6).   ondansetron 4 MG tablet Commonly known as: ZOFRAN Take 1 tablet (4 mg total) by mouth every 6 (six) hours as needed for nausea.   senna 8.6 MG Tabs tablet Commonly known as: SENOKOT Take 2 tablets (17.2 mg total) by mouth at bedtime. What changed:   how much to take  when to take this       Diagnostic Studies: DG Knee Left Port  Result Date: 09/17/2019 CLINICAL DATA:  Status post total knee arthroplasty EXAM: PORTABLE LEFT KNEE - 1-2 VIEW COMPARISON:  None. FINDINGS: Frontal and lateral views obtained. Patient is status post total knee replacement with femoral and tibial prosthetic components well-seated. No fracture or dislocation. No erosive change. Fluid and air within the knee joint is consistent with recent surgery. IMPRESSION: Prosthetic components well-seated.  No fracture or dislocation. Electronically Signed   By: Bretta Bang III M.D.   On: 09/17/2019 11:19    Disposition: Discharge disposition: 01-Home or Self Care       Discharge Instructions    Call MD / Call 911   Complete by: As directed    If you experience chest pain or shortness of breath, CALL 911 and be transported to the hospital emergency room.  If you develope a fever above 101 F, pus (white drainage) or increased drainage or redness at the wound, or calf pain, call your surgeon's office.    Constipation Prevention   Complete by: As directed    Drink plenty of fluids.  Prune juice may be helpful.  You may use a stool softener, such as Colace (over the counter) 100 mg twice a day.  Use MiraLax (over the counter) for constipation as needed.   Diet - low sodium heart healthy   Complete by: As directed    Do not put a pillow under the knee. Place it under the heel.   Complete by: As directed    Driving restrictions   Complete by: As  directed    No driving for 6 weeks   Increase activity slowly as tolerated   Complete by: As directed    Lifting restrictions   Complete by: As directed    No lifting for 6 weeks   TED hose   Complete by: As directed    Use stockings (TED hose) for 2 weeks on both leg(s).  You may remove them at night for sleeping.      Follow-up Information    Ellah Otte, Arlys John, MD. Schedule an appointment as soon as possible for a visit in 2 weeks.   Specialty: Orthopedic Surgery Why: For wound re-check Contact information: [address] [place] [address] [phone]            Signed: Iline Oven Ayza Ripoll 09/18/2019, 7:40 AM

## 2019-09-18 NOTE — Progress Notes (Signed)
Physical Therapy Treatment Patient Details Name: [name] [name] MRN: [id number] DOB: 1960-11-04 Today's Date: 09/18/2019    History of Present Illness Pt s/p L TKR and with hx of R TKR (2020)     PT Comments    Pt progressing well with mobility and eager for dc home.   Follow Up Recommendations  Follow surgeon's recommendation for DC plan and follow-up therapies     Equipment Recommendations  None recommended by PT    Recommendations for Other Services       Precautions / Restrictions Precautions Precautions: Knee;Fall Restrictions Weight Bearing Restrictions: No Other Position/Activity Restrictions: WBAT    Mobility  Bed Mobility               General bed mobility comments: Pt up in chair and requests back to same  Transfers Overall transfer level: Needs assistance Equipment used: Rolling walker (2 wheeled) Transfers: Sit to/from Stand Sit to Stand: Min guard;Supervision         General transfer comment: cues for LE management and use of UEs to self assist  Ambulation/Gait Ambulation/Gait assistance: Min guard;Supervision Gait Distance (Feet): 120 Feet Assistive device: Rolling walker (2 wheeled) Gait Pattern/deviations: Step-to pattern;Decreased step length - right;Decreased step length - left;Shuffle;Trunk flexed Gait velocity: decr   General Gait Details: cues for posture, position from RW and initial sequence   Stairs Stairs: Yes Stairs assistance: Min assist Stair Management: No rails;Step to pattern;Forwards;With walker Number of Stairs: 1 General stair comments: min cues for sequence and foot/RW placement   Wheelchair Mobility    Modified Rankin (Stroke Patients Only)       Balance Overall balance assessment: Mild deficits observed, not formally tested                                          Cognition Arousal/Alertness: Awake/alert Behavior During Therapy: WFL for tasks assessed/performed Overall Cognitive  Status: Within Functional Limits for tasks assessed                                        Exercises Total Joint Exercises Ankle Circles/Pumps: AROM;Both;15 reps;Supine Quad Sets: AROM;Both;Supine;10 reps Heel Slides: AAROM;Left;15 reps;Supine Straight Leg Raises: AAROM;AROM;Left;15 reps;Supine Long Arc Quad: AROM;Left;Seated;15 reps Goniometric ROM: AAROM L knee -5 - 50    General Comments        Pertinent Vitals/Pain Pain Assessment: 0-10 Pain Score: 4  Pain Location: L knee Pain Descriptors / Indicators: Aching;Sore Pain Intervention(s): Limited activity within patient's tolerance;Monitored during session;Premedicated before session;Ice applied    Home Living                      Prior Function            PT Goals (current goals can now be found in the care plan section) Acute Rehab PT Goals Patient Stated Goal: Regain IND and get back to doing what I want to do PT Goal Formulation: With patient Time For Goal Achievement: 09/24/19 Potential to Achieve Goals: Good Progress towards PT goals: Progressing toward goals    Frequency    7X/week      PT Plan Current plan remains appropriate    Co-evaluation              AM-PAC PT "6 Clicks" Mobility  Outcome Measure  Help needed turning from your back to your side while in a flat bed without using bedrails?: A Little Help needed moving from lying on your back to sitting on the side of a flat bed without using bedrails?: A Little Help needed moving to and from a bed to a chair (including a wheelchair)?: A Little Help needed standing up from a chair using your arms (e.g., wheelchair or bedside chair)?: A Little Help needed to walk in hospital room?: A Little Help needed climbing 3-5 steps with a railing? : A Little 6 Click Score: 18    End of Session Equipment Utilized During Treatment: Gait belt Activity Tolerance: Patient tolerated treatment well Patient left: in chair;with  call bell/phone within reach;with chair alarm set Nurse Communication: Mobility status PT Visit Diagnosis: Difficulty in walking, not elsewhere classified (R26.2)     Time: 1470-9295 PT Time Calculation (min) (ACUTE ONLY): 13 min  Charges:  $Gait Training: 8-22 mins $Therapeutic Exercise: 8-22 mins                     Mauro Kaufmann PT Acute Rehabilitation Services Pager [phone] Office [phone]     [name] [name] 09/18/2019, 9:59 AM

## 2019-09-18 NOTE — TOC Transition Note (Signed)
Transition of Care [facility]) - CM/SW Discharge Note   Patient Details  Name: [name] [name] MRN: [id number] Date of Birth: [birth year]  Transition of [facility]) CM/SW Contact:  Lennart Pall, LCSW Phone Number: 09/18/2019, 10:05 AM   Clinical Narrative:   Met briefly with pt and confirmed all DME at home and planning OPPT at [facility].  No needs from TOC.    Final next level of care: OP Rehab Barriers to Discharge: No Barriers Identified   Patient Goals and CMS Choice Patient states their goals for this hospitalization and ongoing recovery are:: go home      Discharge Placement                       Discharge Plan and Services                DME Arranged: N/A(has all needed DME) DME Agency: NA       HH Arranged: NA(plan OPPT at [facility]) [place] Agency: NA        Social Determinants of Health (SDOH) Interventions     Readmission Risk Interventions No flowsheet data found.

## 2019-09-18 NOTE — Progress Notes (Signed)
    Subjective:  Patient reports pain as mild to moderate.  Denies N/V/CP/SOB. C/o L knee pain  Objective:   VITALS:   Vitals:   09/17/19 1601 09/17/19 2047 09/18/19 0131 09/18/19 0617  BP: (!) 147/70 (!) 147/75 (!) 116/54 138/73  Pulse: 90 79 71 69  Resp: 16 15 15 15   Temp: 97.6 F (36.4 C) 98.6 F (37 C) 97.7 F (36.5 C) (!) 97.5 F (36.4 C)  TempSrc: Oral Oral Oral Oral  SpO2: 100% 98% 97% 99%  Weight:      Height:        NAD ABD soft Sensation intact distally Intact pulses distally Dorsiflexion/Plantar flexion intact Incision: dressing C/D/I Compartment soft   Lab Results  Component Value Date   WBC 14.3 (H) 09/18/2019   HGB 10.8 (L) 09/18/2019   HCT 34.2 (L) 09/18/2019   MCV 91.7 09/18/2019   PLT 237 09/18/2019   BMET    Component Value Date/Time   NA 139 09/18/2019 0317   K 3.5 09/18/2019 0317   CL 107 09/18/2019 0317   CO2 26 09/18/2019 0317   GLUCOSE 170 (H) 09/18/2019 0317   BUN 21 (H) 09/18/2019 0317   CREATININE 0.80 09/18/2019 0317   CREATININE 0.97 07/24/2018 0913   CALCIUM 8.1 (L) 09/18/2019 0317   GFRNONAA >60 09/18/2019 0317   GFRNONAA 83 04/17/2018 1009   GFRAA >60 09/18/2019 0317   GFRAA 96 04/17/2018 1009     Assessment/Plan: 1 Day Post-Op   Principal Problem:   Osteoarthritis of left knee Active Problems:   Degenerative arthritis of left knee   WBAT with walker DVT ppx: Aspirin, SCDs, TEDS PO pain control PT/OT Dispo: d/c home with oppt    04/19/2018 [name] [name] 09/18/2019, 7:36 AM   09/20/2019, MD [phone] [facility] is now [facility]  [place] [address], [address], [place], [place] Phone: [phone] [email or web address] Facebook  [phone]

## 2019-09-22 DIAGNOSIS — M25562 Pain in left knee: Secondary | ICD-10-CM | POA: Insufficient documentation

## 2019-10-16 ENCOUNTER — Other Ambulatory Visit: Payer: Self-pay | Admitting: Family Medicine

## 2019-10-30 ENCOUNTER — Other Ambulatory Visit: Payer: Self-pay | Admitting: Family Medicine

## 2019-10-30 DIAGNOSIS — Z1231 Encounter for screening mammogram for malignant neoplasm of breast: Secondary | ICD-10-CM

## 2019-11-05 ENCOUNTER — Other Ambulatory Visit: Payer: Self-pay

## 2019-11-05 ENCOUNTER — Ambulatory Visit
Admission: RE | Admit: 2019-11-05 | Discharge: 2019-11-05 | Disposition: A | Discharge status: Home / Self Care | Payer: 59 | Source: Ambulatory Visit | Attending: Family Medicine | Admitting: Family Medicine

## 2019-11-05 DIAGNOSIS — Z1231 Encounter for screening mammogram for malignant neoplasm of breast: Secondary | ICD-10-CM

## 2019-11-24 ENCOUNTER — Other Ambulatory Visit: Payer: Self-pay

## 2019-11-24 ENCOUNTER — Ambulatory Visit (INDEPENDENT_AMBULATORY_CARE_PROVIDER_SITE_OTHER): Payer: 59 | Admitting: Family Medicine

## 2019-11-24 ENCOUNTER — Encounter: Payer: Self-pay | Admitting: Family Medicine

## 2019-11-24 VITALS — BP 118/68 | HR 64 | Temp 97.1°F | Ht 60.0 in | Wt 216.0 lb

## 2019-11-24 DIAGNOSIS — Z0001 Encounter for general adult medical examination with abnormal findings: Secondary | ICD-10-CM | POA: Diagnosis not present

## 2019-11-24 DIAGNOSIS — Z0000 Encounter for general adult medical examination without abnormal findings: Secondary | ICD-10-CM

## 2019-11-24 DIAGNOSIS — I10 Essential (primary) hypertension: Secondary | ICD-10-CM | POA: Diagnosis not present

## 2019-11-24 DIAGNOSIS — E7800 Pure hypercholesterolemia, unspecified: Secondary | ICD-10-CM | POA: Diagnosis not present

## 2019-11-24 NOTE — Progress Notes (Signed)
Subjective:    Patient ID: [name] [name], female    DOB: 08-Aug-1960, 59 y.o.   MRN: [id number]  HPI Patient is a very pleasant 59 year old female here today for a complete physical exam.  Past medical history is significant for hysterectomy.  Therefore she does not require Pap smears.  Her last colonoscopy was in 2016 and had a polyp.  She is due for repeat colonoscopy this year and this is already been scheduled.  Her mammogram has already been performed earlier this year and was normal.  She had a bone density test 5 years ago which was normal.  Her hysterectomy was a partial hysterectomy and therefore she does not have premature menopause.  Therefore I would not repeat a bone density test until age 62.  She is due for the shingles vaccine.  Covid vaccination is up-to-date.  Past medical history significant for morbid obesity, obstructive sleep apnea, vestibular migraines for which she takes Keppra, and hypertension.  Since I last saw her, she has undergone 2 knee replacements.  Her roommate also was extremely sick and was hospitalized earlier this year.  She was on the verge of death.  Her roommate's daughter also passed away this year.  Therefore the patient has been under tremendous stress.  However she has lost from an initial weight of 270 pounds down to 216 pounds through hard work on her part and diet.  I congratulated her on these changes.  Otherwise she is doing well with no concerns Past Medical History:  Diagnosis Date  . Anxiety 2008   seen in ED /w chest pain but told that she was having anxiety  . Arthritis   . History of kidney stones 1999  . History of migraine   . History of stress test 2010   done due to chest pain /w Dr. Jacinto Halim. Pt. told that it was wnl.  . Hives of unknown origin    told by PCP Terri Piedra) to use Zrytec BID to control hives   . Hypertension   . Neuromuscular disorder ([facility])    back pain  . Sleep difficulties    has had a sleep study - t old that she didn't  meet criteria for machine   . Syncope    due to pain   Past Surgical History:  Procedure Laterality Date  . COLONOSCOPY  2016  . DECOMPRESSIVE LUMBAR LAMINECTOMY LEVEL 1 N/A 10/28/2013   Procedure: DECOMPRESSION L4-5;  Surgeon: Venita Lick, MD;  Location: [facility] OR;  Service: Orthopedics;  Laterality: N/A;  . KNEE ARTHROPLASTY Right 12/24/2018   Procedure: COMPUTER ASSISTED TOTAL KNEE ARTHROPLASTY;  Surgeon: Samson Frederic, MD;  Location: [facility] ORS;  Service: Orthopedics;  Laterality: Right;  . KNEE ARTHROPLASTY Left 09/17/2019   Procedure: COMPUTER ASSISTED TOTAL KNEE ARTHROPLASTY;  Surgeon: Samson Frederic, MD;  Location: [facility] ORS;  Service: Orthopedics;  Laterality: Left;  . KNEE ARTHROSCOPY  12/2008   left knee  . KNEE ARTHROSCOPY Right 01/27/2018  . PARTIAL HYSTERECTOMY  10/1994  . thumb surgery Right 03/1975   Current Outpatient Medications on File Prior to Visit  Medication Sig Dispense Refill  . atorvastatin (LIPITOR) 20 MG tablet TAKE 1 TABLET BY MOUTH EVERY DAY 90 tablet 1  . cetirizine (ZYRTEC) 10 MG tablet Take 10 mg by mouth daily.     Marland Kitchen diltiazem (CARDIZEM CD) 120 MG 24 hr capsule TAKE 1 CAPSULE BY MOUTH EVERY DAY 90 capsule 0  . hydrochlorothiazide (MICROZIDE) 12.5 MG capsule TAKE 1 CAPSULE BY  MOUTH EVERY DAY (Patient taking differently: Take 12.5 mg by mouth daily. ) 30 capsule 5  . ALPRAZolam (XANAX) 0.5 MG tablet Take 1 tablet (0.5 mg total) by mouth 3 (three) times daily as needed. (Patient not taking: Reported on 08/31/2019) 30 tablet 0  . celecoxib (CELEBREX) 200 MG capsule Take 1 capsule (200 mg total) by mouth daily. 30 capsule 2  . docusate sodium (COLACE) 100 MG capsule Take 1 capsule (100 mg total) by mouth 2 (two) times daily. 60 capsule 0  . HYDROcodone-acetaminophen (NORCO/VICODIN) 5-325 MG tablet Take 1 tablet by mouth every 4 (four) hours as needed for moderate pain (pain score 4-6). 40 tablet 0  . ondansetron (ZOFRAN) 4 MG tablet Take 1 tablet (4 mg total) by mouth  every 6 (six) hours as needed for nausea. 20 tablet 0  . senna (SENOKOT) 8.6 MG TABS tablet Take 2 tablets (17.2 mg total) by mouth at bedtime. 60 tablet 0   Current Facility-Administered Medications on File Prior to Visit  Medication Dose Route Frequency Provider Last Rate Last Admin  . gadopentetate dimeglumine (MAGNEVIST) injection 20 mL  20 mL Intravenous Once PRN Sater, Pearletha Furlichard A, MD       Allergies  Allergen Reactions  . Gabapentin     Dizziness  . Other     American cockroaches  . Loratadine Hives and Rash  . Penicillins Hives and Rash    Did it involve swelling of the face/tongue/throat, SOB, or low BP? No Did it involve sudden or severe rash/hives, skin peeling, or any reaction on the inside of your mouth or nose? No Did you need to seek medical attention at a hospital or doctor's office? Yes When did it last happen?Childhood allergy If all above answers are "NO", may proceed with cephalosporin use.    Social History   Socioeconomic History  . Marital status: Single    Spouse name: Not on file  . Number of children: 0  . Years of education: Not on file  . Highest education level: Not on file  Occupational History  . Occupation: Radiographer, therapeuticmanufacturing technician  Tobacco Use  . Smoking status: Never Smoker  . Smokeless tobacco: Never Used  Vaping Use  . Vaping Use: Never used  Substance and Sexual Activity  . Alcohol use: Yes    Comment: rare  . Drug use: No  . Sexual activity: Not on file  Other Topics Concern  . Not on file  Social History Narrative  . Not on file   Social Determinants of Health   Financial Resource Strain:   . Difficulty of Paying Living Expenses:   Food Insecurity:   . Worried About Programme researcher, broadcasting/film/videounning Out of Food in the Last Year:   . Baristaan Out of Food in the Last Year:   Transportation Needs:   . Freight forwarderLack of Transportation (Medical):   Marland Kitchen. Lack of Transportation (Non-Medical):   Physical Activity:   . Days of Exercise per Week:   . Minutes of  Exercise per Session:   Stress:   . Feeling of Stress :   Social Connections:   . Frequency of Communication with Friends and Family:   . Frequency of Social Gatherings with Friends and Family:   . Attends Religious Services:   . Active Member of Clubs or Organizations:   . Attends BankerClub or Organization Meetings:   Marland Kitchen. Marital Status:   Intimate Partner Violence:   . Fear of Current or Ex-Partner:   . Emotionally Abused:   Marland Kitchen. Physically Abused:   .  Sexually Abused:    Family History  Problem Relation Age of Onset  . Stroke Father   . Diabetes Other   . Hyperlipidemia Other   . Heart disease Other   . Hypertension Other   . Cancer Other   . Breast cancer Neg Hx       Review of Systems  All other systems reviewed and are negative.      Objective:   Physical Exam Vitals reviewed.  Constitutional:      General: She is not in acute distress.    Appearance: Normal appearance. She is obese. She is not ill-appearing, toxic-appearing or diaphoretic.  HENT:     Head: Normocephalic and atraumatic.     Right Ear: Tympanic membrane, ear canal and external ear normal. There is no impacted cerumen.     Left Ear: Tympanic membrane, ear canal and external ear normal. There is no impacted cerumen.     Nose: Nose normal. No congestion or rhinorrhea.     Mouth/Throat:     Pharynx: No oropharyngeal exudate or posterior oropharyngeal erythema.  Eyes:     Conjunctiva/sclera: Conjunctivae normal.     Pupils: Pupils are equal, round, and reactive to light.  Neck:     Vascular: No carotid bruit.  Cardiovascular:     Rate and Rhythm: Normal rate and regular rhythm.     Pulses: Normal pulses.     Heart sounds: Normal heart sounds. No murmur heard.  No friction rub. No gallop.   Pulmonary:     Effort: Pulmonary effort is normal. No respiratory distress.     Breath sounds: Normal breath sounds. No stridor. No wheezing, rhonchi or rales.  Chest:     Chest wall: No tenderness.  Abdominal:       General: Bowel sounds are normal. There is no distension.     Palpations: Abdomen is soft. There is no mass.     Tenderness: There is no abdominal tenderness. There is no right CVA tenderness, left CVA tenderness, guarding or rebound.     Hernia: No hernia is present.  Musculoskeletal:     Cervical back: Normal range of motion and neck supple.     Right lower leg: No edema.     Left lower leg: No edema.  Lymphadenopathy:     Cervical: No cervical adenopathy.  Skin:    General: Skin is warm and dry.     Coloration: Skin is not jaundiced or pale.     Findings: No bruising, erythema, lesion or rash.  Neurological:     General: No focal deficit present.     Mental Status: She is alert and oriented to person, place, and time.     Cranial Nerves: No cranial nerve deficit.     Sensory: No sensory deficit.     Motor: No weakness.     Coordination: Coordination normal.     Gait: Gait normal.     Deep Tendon Reflexes: Reflexes normal.           Assessment & Plan:  Pure hypercholesterolemia - Plan: CBC with Differential/Platelet, COMPLETE METABOLIC PANEL WITH GFR, Lipid panel  Benign essential HTN  General medical exam  Physical exam today significant for an elevated BMI.  However the patient has worked hard on this and has lost substantial weight.  I congratulated her.  Her blood pressure today is well controlled at 118/68.  I will check a CBC, CMP, and fasting lipid panel.  Mammogram is up-to-date.  Patient has already  scheduled her colonoscopy.  She does not require a Pap smear due to her medical history.  She does not require a bone density test until age 29.  I did recommend the shingles vaccine.  The remainder of her immunizations are up-to-date.  Regular anticipatory guidance is provided

## 2019-11-25 LAB — CBC WITH DIFFERENTIAL/PLATELET
Absolute Monocytes: 401 cells/uL (ref 200–950)
Basophils Absolute: 41 cells/uL (ref 0–200)
Basophils Relative: 0.7 %
Eosinophils Absolute: 360 cells/uL (ref 15–500)
Eosinophils Relative: 6.1 %
HCT: 39.1 % (ref 35.0–45.0)
Hemoglobin: 12.9 g/dL (ref 11.7–15.5)
Lymphs Abs: 1664 cells/uL (ref 850–3900)
MCH: 28.8 pg (ref 27.0–33.0)
MCHC: 33 g/dL (ref 32.0–36.0)
MCV: 87.3 fL (ref 80.0–100.0)
MPV: 11.3 fL (ref 7.5–12.5)
Monocytes Relative: 6.8 %
Neutro Abs: 3434 cells/uL (ref 1500–7800)
Neutrophils Relative %: 58.2 %
Platelets: 293 10*3/uL (ref 140–400)
RBC: 4.48 10*6/uL (ref 3.80–5.10)
RDW: 13.1 % (ref 11.0–15.0)
Total Lymphocyte: 28.2 %
WBC: 5.9 10*3/uL (ref 3.8–10.8)

## 2019-11-25 LAB — COMPLETE METABOLIC PANEL WITH GFR
AG Ratio: 1.9 (calc) (ref 1.0–2.5)
ALT: 14 U/L (ref 6–29)
AST: 17 U/L (ref 10–35)
Albumin: 3.9 g/dL (ref 3.6–5.1)
Alkaline phosphatase (APISO): 90 U/L (ref 37–153)
BUN: 12 mg/dL (ref 7–25)
CO2: 26 mmol/L (ref 20–32)
Calcium: 8.9 mg/dL (ref 8.6–10.4)
Chloride: 108 mmol/L (ref 98–110)
Creat: 0.76 mg/dL (ref 0.50–0.99)
GFR, Est African American: 99 mL/min/{1.73_m2} (ref >=60)
GFR, Est Non African American: 85 mL/min/{1.73_m2} (ref >=60)
Globulin: 2.1 g/dL (calc) (ref 1.9–3.7)
Glucose, Bld: 72 mg/dL (ref 65–99)
Potassium: 4.1 mmol/L (ref 3.5–5.3)
Sodium: 144 mmol/L (ref 135–146)
Total Bilirubin: 0.6 mg/dL (ref 0.2–1.2)
Total Protein: 6 g/dL — ABNORMAL LOW (ref 6.1–8.1)

## 2019-11-25 LAB — LIPID PANEL
Cholesterol: 130 mg/dL (ref <200)
HDL: 50 mg/dL (ref >=50)
LDL Cholesterol (Calc): 63 mg/dL (calc)
Non-HDL Cholesterol (Calc): 80 mg/dL (calc) (ref <130)
Total CHOL/HDL Ratio: 2.6 (calc) (ref <5.0)
Triglycerides: 87 mg/dL (ref <150)

## 2019-11-30 ENCOUNTER — Other Ambulatory Visit: Payer: Self-pay | Admitting: Family Medicine

## 2019-11-30 DIAGNOSIS — R001 Bradycardia, unspecified: Secondary | ICD-10-CM

## 2019-12-08 ENCOUNTER — Other Ambulatory Visit: Payer: 59

## 2020-02-01 ENCOUNTER — Other Ambulatory Visit: Payer: Self-pay | Admitting: Family Medicine

## 2020-07-09 IMAGING — DX DG KNEE 1-2V PORT*L*
2 series · 2 of 2 positions shown · non-contrast
Comparison: None.

CLINICAL DATA: Status post total knee arthroplasty

EXAM:
PORTABLE LEFT KNEE - 1-2 VIEW

[knee ap (1 of 2)]
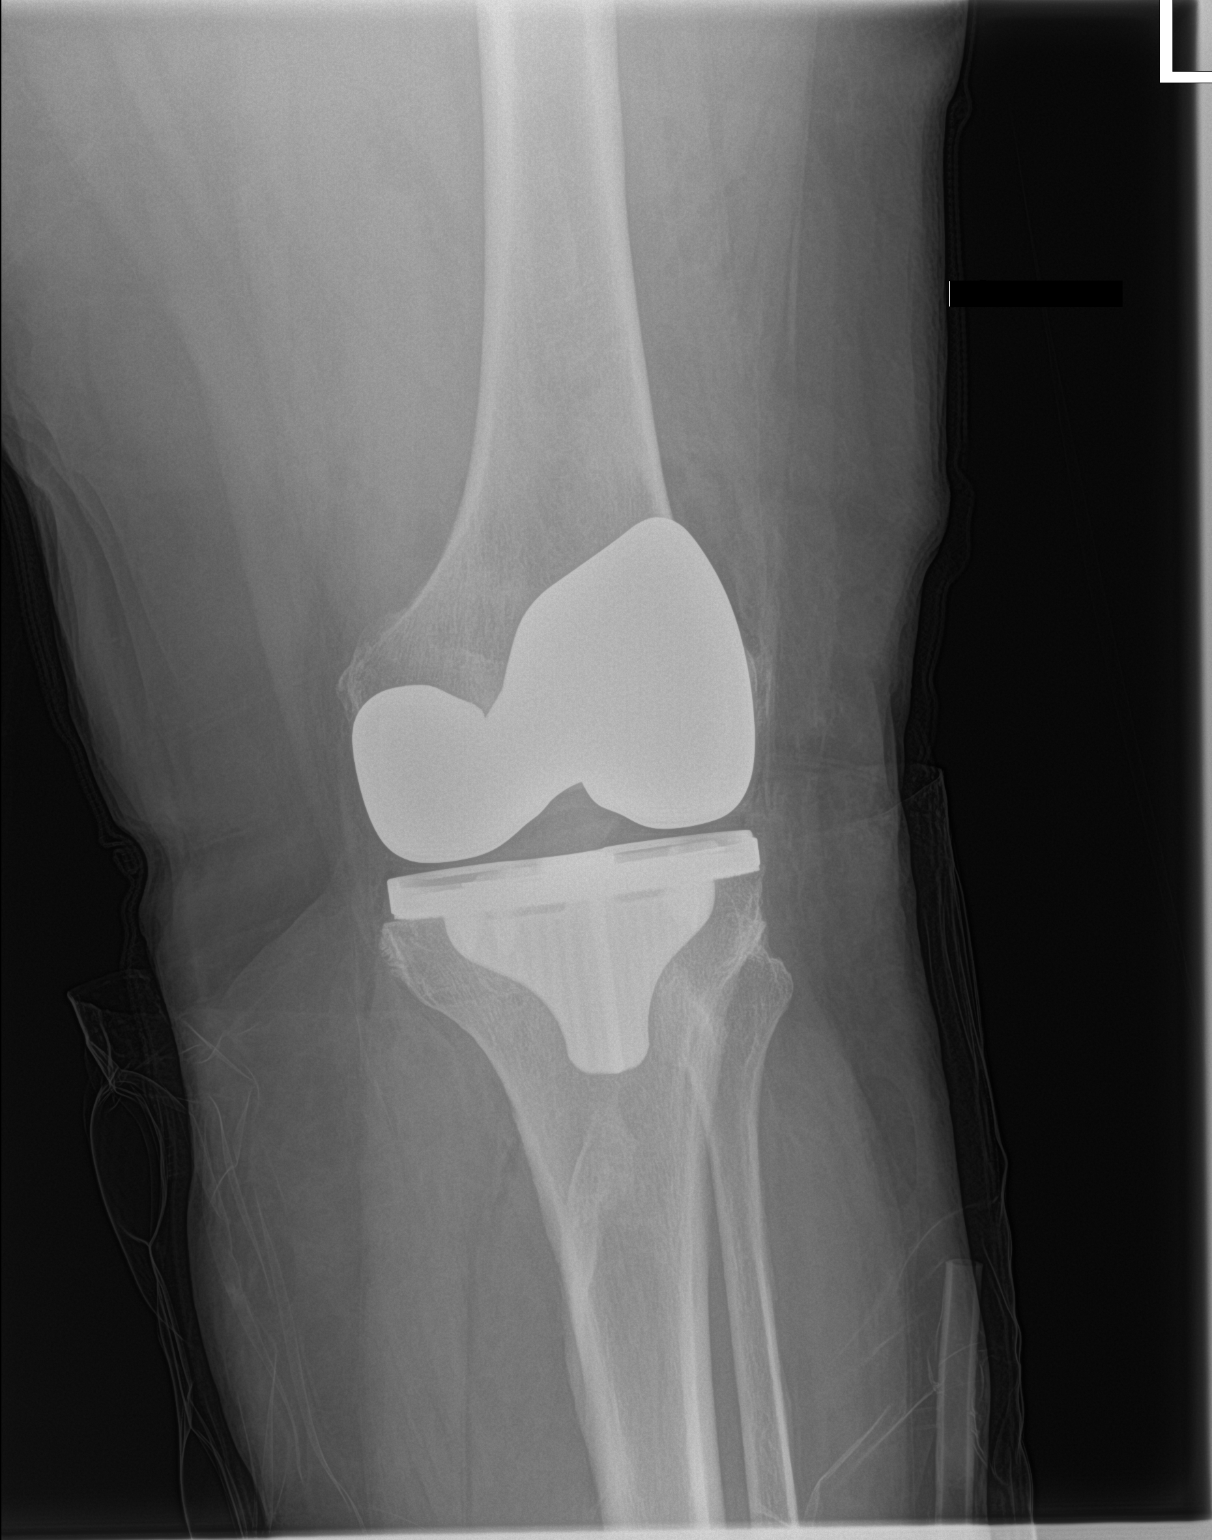

[knee ap (2 of 2)]
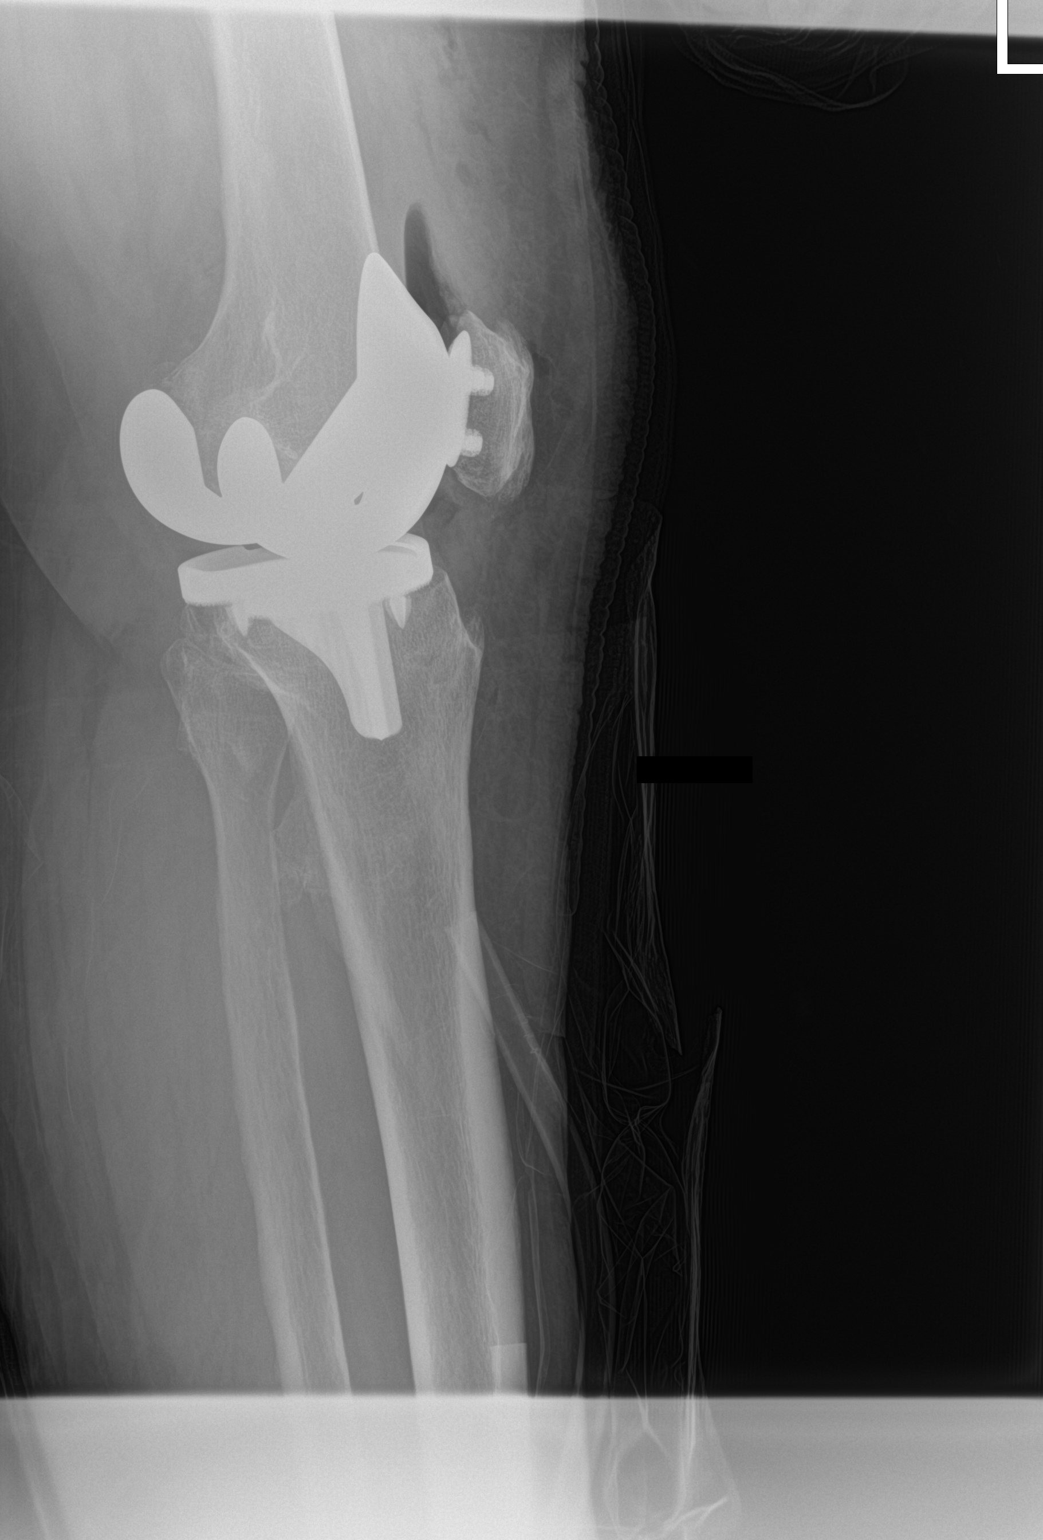

[2 of 2 positions shown; findings below may reference images not displayed]

FINDINGS: Frontal and lateral views obtained. Patient is status post total
knee replacement with femoral and tibial prosthetic components
well-seated. No fracture or dislocation. No erosive change. Fluid
and air within the knee joint is consistent with recent surgery.
IMPRESSION: Prosthetic components well-seated.  No fracture or dislocation.

## 2020-08-10 ENCOUNTER — Other Ambulatory Visit: Payer: Self-pay | Admitting: Family Medicine

## 2020-08-10 ENCOUNTER — Other Ambulatory Visit: Payer: Self-pay

## 2020-08-10 MED ORDER — ATORVASTATIN CALCIUM 20 MG PO TABS: 1.0000 | ORAL_TABLET | Freq: Every day | ORAL | 1 refills | Status: DC

## 2020-09-20 ENCOUNTER — Other Ambulatory Visit: Payer: Self-pay | Admitting: Family Medicine

## 2020-09-20 DIAGNOSIS — R001 Bradycardia, unspecified: Secondary | ICD-10-CM

## 2020-09-22 ENCOUNTER — Other Ambulatory Visit: Payer: Self-pay | Admitting: Family Medicine

## 2020-09-22 DIAGNOSIS — Z1231 Encounter for screening mammogram for malignant neoplasm of breast: Secondary | ICD-10-CM

## 2020-10-13 ENCOUNTER — Encounter (HOSPITAL_COMMUNITY): Payer: Self-pay

## 2020-10-13 ENCOUNTER — Ambulatory Visit (HOSPITAL_COMMUNITY)
Admission: EM | Admit: 2020-10-13 | Discharge: 2020-10-13 | Disposition: A | Discharge status: Home / Self Care | Payer: Medicare Other

## 2020-10-13 ENCOUNTER — Other Ambulatory Visit: Payer: Self-pay

## 2020-10-13 DIAGNOSIS — L501 Idiopathic urticaria: Secondary | ICD-10-CM

## 2020-10-13 MED ORDER — HYDROXYZINE HCL 25 MG PO TABS: 12.5000 mg | ORAL_TABLET | Freq: Three times a day (TID) | ORAL | 0 refills | Status: DC | PRN

## 2020-10-13 MED ORDER — PREDNISONE 10 MG PO TABS
10.0000 mg | ORAL_TABLET | Freq: Every day | ORAL | 0 refills | Status: DC
Start: 2020-10-13 — End: 2020-11-24

## 2020-10-13 NOTE — ED Triage Notes (Signed)
Reports hives in torso and leg x 2 days. States she has chronic hives and takes Zyrtec 20 mg Zyrtec daily.

## 2020-10-13 NOTE — ED Provider Notes (Signed)
[facility]   MRN: [id number] DOB: [birth year]  Subjective:   [name] [name] is a 60 y.o. female presenting for 1 day history of acute onset recurrent urticaria scattered over her body except the genital area and face.  Denies fever, facial swelling, oral swelling, shortness of breath or wheezing.  Reports that she has had this problem over the past 2 to 3 years.  Has had extensive work-up.  She takes Zyrtec daily but does not know if it is working anymore. Denies eating any new foods, starting new medications, exposure to poisonous plants, new hygiene products, new cleaning products or detergents.   No current facility-administered medications for this encounter.  Current Outpatient Medications:    Multiple Vitamin (MULTIVITAMIN) tablet, Take 1 tablet by mouth daily., Disp: , Rfl:    ALPRAZolam (XANAX) 0.5 MG tablet, Take 1 tablet (0.5 mg total) by mouth 3 (three) times daily as needed. (Patient not taking: No sig reported), Disp: 30 tablet, Rfl: 0   atorvastatin (LIPITOR) 20 MG tablet, Take 1 tablet (20 mg total) by mouth daily., Disp: 90 tablet, Rfl: 1   celecoxib (CELEBREX) 200 MG capsule, Take 1 capsule (200 mg total) by mouth daily., Disp: 30 capsule, Rfl: 2   cetirizine (ZYRTEC) 10 MG tablet, Take 10 mg by mouth daily. , Disp: , Rfl:    diltiazem (CARDIZEM CD) 120 MG 24 hr capsule, TAKE 1 CAPSULE BY MOUTH EVERY DAY, Disp: 90 capsule, Rfl: 2   docusate sodium (COLACE) 100 MG capsule, Take 1 capsule (100 mg total) by mouth 2 (two) times daily., Disp: 60 capsule, Rfl: 0   hydrochlorothiazide (MICROZIDE) 12.5 MG capsule, TAKE 1 CAPSULE BY MOUTH EVERY DAY, Disp: 90 capsule, Rfl: 1   HYDROcodone-acetaminophen (NORCO/VICODIN) 5-325 MG tablet, Take 1 tablet by mouth every 4 (four) hours as needed for moderate pain (pain score 4-6)., Disp: 40 tablet, Rfl: 0   ondansetron (ZOFRAN) 4 MG tablet, Take 1 tablet (4 mg total) by mouth every 6 (six) hours as needed for nausea., Disp:  20 tablet, Rfl: 0   senna (SENOKOT) 8.6 MG TABS tablet, Take 2 tablets (17.2 mg total) by mouth at bedtime., Disp: 60 tablet, Rfl: 0  Facility-Administered Medications Ordered in Other Encounters:    gadopentetate dimeglumine (MAGNEVIST) injection 20 mL, 20 mL, Intravenous, Once PRN, Sater, Pearletha Furl, MD   Allergies  Allergen Reactions   Gabapentin     Dizziness   Other     American cockroaches   Penicillin G     Other reaction(s): hives   Loratadine Hives and Rash   Penicillins Hives and Rash    Did it involve swelling of the face/tongue/throat, SOB, or low BP? No Did it involve sudden or severe rash/hives, skin peeling, or any reaction on the inside of your mouth or nose? No Did you need to seek medical attention at a hospital or doctor's office? Yes When did it last happen?      Childhood allergy If all above answers are "NO", may proceed with cephalosporin use.     Past Medical History:  Diagnosis Date   Anxiety 2008   seen in ED /w chest pain but told that she was having anxiety   Arthritis    History of kidney stones 1999   History of migraine    History of stress test 2010   done due to chest pain /w Dr. Jacinto Halim. Pt. told that it was wnl.   Hives of unknown origin  told by PCP Terri Piedra) to use Zrytec BID to control hives    Hypertension    Neuromuscular disorder (HCC)    back pain   Sleep difficulties    has had a sleep study - t old that she didn't meet criteria for machine    Syncope    due to pain     Past Surgical History:  Procedure Laterality Date   COLONOSCOPY  2016   DECOMPRESSIVE LUMBAR LAMINECTOMY LEVEL 1 N/A 10/28/2013   Procedure: DECOMPRESSION L4-5;  Surgeon: Venita Lick, MD;  Location: [facility];  Service: Orthopedics;  Laterality: N/A;   KNEE ARTHROPLASTY Right 12/24/2018   Procedure: COMPUTER ASSISTED TOTAL KNEE ARTHROPLASTY;  Surgeon: Samson Frederic, MD;  Location: [facility];  Service: Orthopedics;  Laterality: Right;   KNEE ARTHROPLASTY Left  09/17/2019   Procedure: COMPUTER ASSISTED TOTAL KNEE ARTHROPLASTY;  Surgeon: Samson Frederic, MD;  Location: [facility];  Service: Orthopedics;  Laterality: Left;   KNEE ARTHROSCOPY  12/2008   left knee   KNEE ARTHROSCOPY Right 01/27/2018   PARTIAL HYSTERECTOMY  10/1994   thumb surgery Right 03/1975    Family History  Problem Relation Age of Onset   Stroke Father    Diabetes Other    Hyperlipidemia Other    Heart disease Other    Hypertension Other    Cancer Other    Breast cancer Neg Hx     Social History   Tobacco Use   Smoking status: Never   Smokeless tobacco: Never  Vaping Use   Vaping Use: Never used  Substance Use Topics   Alcohol use: Yes    Comment: rare   Drug use: No    ROS   Objective:   Vitals: BP (!) 151/83 (BP Location: Right Arm)   Pulse 76   Temp 97.7 F (36.5 C) (Oral)   Resp 18   SpO2 96%   Physical Exam Constitutional:      General: She is not in acute distress.    Appearance: Normal appearance. She is well-developed. She is obese. She is not ill-appearing, toxic-appearing or diaphoretic.  HENT:     Head: Normocephalic and atraumatic.     Comments: No oral or facial swelling.    Right Ear: External ear normal.     Left Ear: External ear normal.     Nose: Nose normal.     Mouth/Throat:     Mouth: Mucous membranes are moist.     Comments: Airway is patent. Eyes:     General: No scleral icterus.       Right eye: No discharge.        Left eye: No discharge.     Extraocular Movements: Extraocular movements intact.     Conjunctiva/sclera: Conjunctivae normal.     Pupils: Pupils are equal, round, and reactive to light.  Cardiovascular:     Rate and Rhythm: Normal rate.  Pulmonary:     Effort: Pulmonary effort is normal.  Skin:    General: Skin is warm and dry.     Findings: Rash (scattered urticarial lesions over her extremities, torso sparing the face) present.  Neurological:     General: No focal deficit present.     Mental Status:  She is alert and oriented to person, place, and time.  Psychiatric:        Mood and Affect: Mood normal.        Behavior: Behavior normal.     Assessment and Plan :   PDMP not reviewed  this encounter.  1. Chronic idiopathic urticaria     Patient has typically responded very well to steroids with her urticaria.  Recommended oral prednisone, hydroxyzine.  Follow-up with PCP and allergist. Counseled patient on potential for adverse effects with medications prescribed/recommended today, ER and return-to-clinic precautions discussed, patient verbalized understanding.    Wallis Bamberg, [place] 10/13/20 [phone]

## 2020-11-08 ENCOUNTER — Ambulatory Visit: Payer: 59

## 2020-11-22 ENCOUNTER — Other Ambulatory Visit: Payer: Self-pay

## 2020-11-22 ENCOUNTER — Other Ambulatory Visit: Payer: Medicare Other

## 2020-11-22 DIAGNOSIS — E7800 Pure hypercholesterolemia, unspecified: Secondary | ICD-10-CM

## 2020-11-22 DIAGNOSIS — I10 Essential (primary) hypertension: Secondary | ICD-10-CM

## 2020-11-23 LAB — COMPLETE METABOLIC PANEL WITH GFR
AG Ratio: 1.9 (calc) (ref 1.0–2.5)
ALT: 14 U/L (ref 6–29)
AST: 16 U/L (ref 10–35)
Albumin: 3.9 g/dL (ref 3.6–5.1)
Alkaline phosphatase (APISO): 99 U/L (ref 37–153)
BUN: 18 mg/dL (ref 7–25)
CO2: 27 mmol/L (ref 20–32)
Calcium: 9.2 mg/dL (ref 8.6–10.4)
Chloride: 108 mmol/L (ref 98–110)
Creat: 0.87 mg/dL (ref 0.50–1.05)
Globulin: 2.1 g/dL (calc) (ref 1.9–3.7)
Glucose, Bld: 82 mg/dL (ref 65–99)
Potassium: 4.2 mmol/L (ref 3.5–5.3)
Sodium: 143 mmol/L (ref 135–146)
Total Bilirubin: 0.7 mg/dL (ref 0.2–1.2)
Total Protein: 6 g/dL — ABNORMAL LOW (ref 6.1–8.1)
eGFR: 76 mL/min/{1.73_m2} (ref >=60)

## 2020-11-23 LAB — CBC WITH DIFFERENTIAL/PLATELET
Absolute Monocytes: 554 cells/uL (ref 200–950)
Basophils Absolute: 40 cells/uL (ref 0–200)
Basophils Relative: 0.6 %
Eosinophils Absolute: 389 cells/uL (ref 15–500)
Eosinophils Relative: 5.9 %
HCT: 41.3 % (ref 35.0–45.0)
Hemoglobin: 13.1 g/dL (ref 11.7–15.5)
Lymphs Abs: 2092 cells/uL (ref 850–3900)
MCH: 26.8 pg — ABNORMAL LOW (ref 27.0–33.0)
MCHC: 31.7 g/dL — ABNORMAL LOW (ref 32.0–36.0)
MCV: 84.5 fL (ref 80.0–100.0)
MPV: 11.3 fL (ref 7.5–12.5)
Monocytes Relative: 8.4 %
Neutro Abs: 3524 cells/uL (ref 1500–7800)
Neutrophils Relative %: 53.4 %
Platelets: 315 10*3/uL (ref 140–400)
RBC: 4.89 10*6/uL (ref 3.80–5.10)
RDW: 19.2 % — ABNORMAL HIGH (ref 11.0–15.0)
Total Lymphocyte: 31.7 %
WBC: 6.6 10*3/uL (ref 3.8–10.8)

## 2020-11-23 LAB — LIPID PANEL
Cholesterol: 134 mg/dL (ref <200)
HDL: 52 mg/dL (ref >=50)
LDL Cholesterol (Calc): 64 mg/dL (calc)
Non-HDL Cholesterol (Calc): 82 mg/dL (calc) (ref <130)
Total CHOL/HDL Ratio: 2.6 (calc) (ref <5.0)
Triglycerides: 99 mg/dL (ref <150)

## 2020-11-23 LAB — TSH: TSH: 1.68 mIU/L (ref 0.40–4.50)

## 2020-11-24 ENCOUNTER — Encounter: Payer: Self-pay | Admitting: Family Medicine

## 2020-11-24 ENCOUNTER — Other Ambulatory Visit: Payer: Self-pay

## 2020-11-24 ENCOUNTER — Ambulatory Visit (INDEPENDENT_AMBULATORY_CARE_PROVIDER_SITE_OTHER): Payer: Medicare Other | Admitting: Family Medicine

## 2020-11-24 VITALS — BP 132/74 | HR 70 | Temp 97.9°F | Resp 16 | Ht 60.0 in | Wt 247.0 lb

## 2020-11-24 DIAGNOSIS — I10 Essential (primary) hypertension: Secondary | ICD-10-CM

## 2020-11-24 DIAGNOSIS — Z0000 Encounter for general adult medical examination without abnormal findings: Secondary | ICD-10-CM

## 2020-11-24 DIAGNOSIS — F321 Major depressive disorder, single episode, moderate: Secondary | ICD-10-CM | POA: Diagnosis not present

## 2020-11-24 MED ORDER — ESCITALOPRAM OXALATE 10 MG PO TABS: 10.0000 mg | ORAL_TABLET | Freq: Every day | ORAL | 5 refills | Status: DC

## 2020-11-24 MED ORDER — ALPRAZOLAM 0.5 MG PO TABS: 0.5000 mg | ORAL_TABLET | Freq: Two times a day (BID) | ORAL | 0 refills | Status: DC | PRN

## 2020-11-24 NOTE — Progress Notes (Signed)
Subjective:    Patient ID: [name] [name], female    DOB: [birth year], 60 y.o.   MRN: [id number]  HPI Patient is a very pleasant 60 year old female here today for a complete physical exam.  Past medical history is significant for hysterectomy.  Therefore she does not require Pap smears.  She had a colonoscopy last year that was normal.  She is not yet due for another bone density.  Mammogram is already been scheduled for September.  Unfortunately she still reports feeling very tired.  She is not sleeping well at night.  She estimates that she is getting about 4 hours of sleep at best.  She denies any apneic episodes or snoring or shortness of breath.  She does report depression and anhedonia.  She admits that she is eating for comfort and therefore she is gained weight back.  She reports lack of energy and lack of focus.  She denies any suicidal ideation. Past Medical History:  Diagnosis Date   Anxiety 2008   seen in ED /w chest pain but told that she was having anxiety   Arthritis    History of kidney stones 1999   History of migraine    History of stress test 2010   done due to chest pain /w Dr. Einar Gip. Pt. told that it was wnl.   Hives of unknown origin    told by PCP Allyson Sabal) to use Zrytec BID to control hives    Hypertension    Neuromuscular disorder ([place])    back pain   Sleep difficulties    has had a sleep study - t old that she didn't meet criteria for machine    Syncope    due to pain   Past Surgical History:  Procedure Laterality Date   COLONOSCOPY  2016   DECOMPRESSIVE LUMBAR LAMINECTOMY LEVEL 1 N/A 10/28/2013   Procedure: DECOMPRESSION L4-5;  Surgeon: Melina Schools, MD;  Location: [facility];  Service: Orthopedics;  Laterality: N/A;   KNEE ARTHROPLASTY Right 12/24/2018   Procedure: COMPUTER ASSISTED TOTAL KNEE ARTHROPLASTY;  Surgeon: Rod Can, MD;  Location: [facility];  Service: Orthopedics;  Laterality: Right;   KNEE ARTHROPLASTY Left 09/17/2019   Procedure: COMPUTER  ASSISTED TOTAL KNEE ARTHROPLASTY;  Surgeon: Rod Can, MD;  Location: [facility];  Service: Orthopedics;  Laterality: Left;   KNEE ARTHROSCOPY  12/2008   left knee   KNEE ARTHROSCOPY Right 01/27/2018   PARTIAL HYSTERECTOMY  10/1994   thumb surgery Right 03/1975   Current Outpatient Medications on File Prior to Visit  Medication Sig Dispense Refill   atorvastatin (LIPITOR) 20 MG tablet Take 1 tablet (20 mg total) by mouth daily. 90 tablet 1   cetirizine (ZYRTEC) 10 MG tablet Take 10 mg by mouth daily.      diltiazem (CARDIZEM CD) 120 MG 24 hr capsule TAKE 1 CAPSULE BY MOUTH EVERY DAY 90 capsule 2   hydrochlorothiazide (MICROZIDE) 12.5 MG capsule TAKE 1 CAPSULE BY MOUTH EVERY DAY 90 capsule 1   Multiple Vitamin (MULTIVITAMIN) tablet Take 1 tablet by mouth daily.     Current Facility-Administered Medications on File Prior to Visit  Medication Dose Route Frequency Provider Last Rate Last Admin   gadopentetate dimeglumine (MAGNEVIST) injection 20 mL  20 mL Intravenous Once PRN Sater, Nanine Means, MD       Allergies  Allergen Reactions   Gabapentin     Dizziness   Other     American cockroaches   Penicillin G  Other reaction(s): hives   Penicillins Hives and Rash    Did it involve swelling of the face/tongue/throat, SOB, or low BP? No Did it involve sudden or severe rash/hives, skin peeling, or any reaction on the inside of your mouth or nose? No Did you need to seek medical attention at a hospital or doctor's office? Yes When did it last happen?      Childhood allergy If all above answers are "NO", may proceed with cephalosporin use.    Social History   Socioeconomic History   Marital status: Single    Spouse name: Not on file   Number of children: 0   Years of education: Not on file   Highest education level: Not on file  Occupational History   Occupation: Education administrator  Tobacco Use   Smoking status: Never   Smokeless tobacco: Never  Vaping Use   Vaping Use:  Never used  Substance and Sexual Activity   Alcohol use: Yes    Comment: rare   Drug use: No   Sexual activity: Not on file  Other Topics Concern   Not on file  Social History Narrative   Not on file   Social Determinants of Health   Financial Resource Strain: Not on file  Food Insecurity: Not on file  Transportation Needs: Not on file  Physical Activity: Not on file  Stress: Not on file  Social Connections: Not on file  Intimate Partner Violence: Not on file   Family History  Problem Relation Age of Onset   Stroke Father    Diabetes Other    Hyperlipidemia Other    Heart disease Other    Hypertension Other    Cancer Other    Breast cancer Neg Hx       Review of Systems     Objective:   Physical Exam Constitutional:      General: She is not in acute distress.    Appearance: Normal appearance. She is obese. She is not ill-appearing, toxic-appearing or diaphoretic.  HENT:     Head: Normocephalic and atraumatic.     Right Ear: Tympanic membrane and ear canal normal.     Left Ear: Tympanic membrane and ear canal normal.     Nose: No congestion or rhinorrhea.     Mouth/Throat:     Mouth: Mucous membranes are moist.     Pharynx: Oropharynx is clear. No oropharyngeal exudate or posterior oropharyngeal erythema.  Eyes:     Extraocular Movements: Extraocular movements intact.     Conjunctiva/sclera: Conjunctivae normal.     Pupils: Pupils are equal, round, and reactive to light.  Neck:     Vascular: No carotid bruit.  Cardiovascular:     Rate and Rhythm: Normal rate and regular rhythm.     Pulses: Normal pulses.     Heart sounds: Normal heart sounds. No murmur heard.   No friction rub. No gallop.  Pulmonary:     Effort: Pulmonary effort is normal. No respiratory distress.     Breath sounds: Normal breath sounds. No stridor. No wheezing, rhonchi or rales.  Abdominal:     General: Bowel sounds are normal. There is no distension.     Palpations: There is no mass.      Tenderness: There is no abdominal tenderness. There is no right CVA tenderness, left CVA tenderness, guarding or rebound.     Hernia: No hernia is present.  Musculoskeletal:        General: No swelling.  Cervical back: Normal range of motion and neck supple. No rigidity.     Right lower leg: Edema present.     Left lower leg: Edema present.  Lymphadenopathy:     Cervical: No cervical adenopathy.  Skin:    General: Skin is warm.     Coloration: Skin is not jaundiced or pale.     Findings: No bruising, erythema, lesion or rash.  Neurological:     General: No focal deficit present.     Mental Status: She is alert and oriented to person, place, and time. Mental status is at baseline.     Cranial Nerves: No cranial nerve deficit.     Sensory: No sensory deficit.     Motor: No weakness.     Coordination: Coordination normal.  Psychiatric:        Mood and Affect: Mood normal.        Behavior: Behavior normal.        Thought Content: Thought content normal.        Judgment: Judgment normal.      General medical exam  Current moderate episode of major depressive disorder without prior episode (HCC)  Benign essential HTN I will start the patient on Lexapro 10 mg a day and use Xanax 0.5 mg p.o. nightly as needed insomnia.  Reassess in 4 to 5 weeks.  Blood work looks outstanding Lab on 11/22/2020  Component Date Value Ref Range Status   Cholesterol 11/22/2020 134  <200 mg/dL Final   HDL 11/22/2020 52  > OR = 50 mg/dL Final   Triglycerides 11/22/2020 99  <150 mg/dL Final   LDL Cholesterol (Calc) 11/22/2020 64  mg/dL (calc) Final   Comment: Reference range: <100 . Desirable range <100 mg/dL for primary prevention;   <70 mg/dL for patients with CHD or diabetic patients  with > or = 2 CHD risk factors. Marland Kitchen LDL-C is now calculated using the Martin-Hopkins  calculation, which is a validated novel method providing  better accuracy than the Friedewald equation in the  estimation  of LDL-C.  Cresenciano Genre et al. Annamaria Helling. 4982;641(58): 2061-2068  (http://education.[facility].com/faq/FAQ164)    Total CHOL/HDL Ratio 11/22/2020 2.6  <5.0 (calc) Final   Non-HDL Cholesterol (Calc) 11/22/2020 82  <130 mg/dL (calc) Final   Comment: For patients with diabetes plus 1 major ASCVD risk  factor, treating to a non-HDL-C goal of <100 mg/dL  (LDL-C of <70 mg/dL) is considered a therapeutic  option.    Glucose, Bld 11/22/2020 82  65 - 99 mg/dL Final   Comment: .            Fasting reference interval .    BUN 11/22/2020 18  7 - 25 mg/dL Final   Creat 11/22/2020 0.87  0.50 - 1.05 mg/dL Final   eGFR 11/22/2020 76  > OR = 60 mL/min/1.55m Final   Comment: The eGFR is based on the CKD-EPI 2021 equation. To calculate  the new eGFR from a previous Creatinine or Cystatin C result, go to https://www.kidney.org/professionals/ kdoqi/gfr%5Fcalculator    BUN/Creatinine Ratio 030/94/0768NOT APPLICABLE  6 - 22 (calc) Final   Sodium 11/22/2020 143  135 - 146 mmol/L Final   Potassium 11/22/2020 4.2  3.5 - 5.3 mmol/L Final   Chloride 11/22/2020 108  98 - 110 mmol/L Final   CO2 11/22/2020 27  20 - 32 mmol/L Final   Calcium 11/22/2020 9.2  8.6 - 10.4 mg/dL Final   Total Protein 11/22/2020 6.0 (A) 6.1 - 8.1 g/dL Final  Albumin 11/22/2020 3.9  3.6 - 5.1 g/dL Final   Globulin 11/22/2020 2.1  1.9 - 3.7 g/dL (calc) Final   AG Ratio 11/22/2020 1.9  1.0 - 2.5 (calc) Final   Total Bilirubin 11/22/2020 0.7  0.2 - 1.2 mg/dL Final   Alkaline phosphatase (APISO) 11/22/2020 99  37 - 153 U/L Final   AST 11/22/2020 16  10 - 35 U/L Final   ALT 11/22/2020 14  6 - 29 U/L Final   WBC 11/22/2020 6.6  3.8 - 10.8 Thousand/uL Final   RBC 11/22/2020 4.89  3.80 - 5.10 Million/uL Final   Hemoglobin 11/22/2020 13.1  11.7 - 15.5 g/dL Final   HCT 11/22/2020 41.3  35.0 - 45.0 % Final   MCV 11/22/2020 84.5  80.0 - 100.0 fL Final   MCH 11/22/2020 26.8 (A) 27.0 - 33.0 pg Final   MCHC 11/22/2020 31.7 (A) 32.0 - 36.0  g/dL Final   RDW 11/22/2020 19.2 (A) 11.0 - 15.0 % Final   Platelets 11/22/2020 315  140 - 400 Thousand/uL Final   MPV 11/22/2020 11.3  7.5 - 12.5 fL Final   Neutro Abs 11/22/2020 3,524  1,500 - 7,800 cells/uL Final   Lymphs Abs 11/22/2020 2,092  850 - 3,900 cells/uL Final   Absolute Monocytes 11/22/2020 554  200 - 950 cells/uL Final   Eosinophils Absolute 11/22/2020 389  15 - 500 cells/uL Final   Basophils Absolute 11/22/2020 40  0 - 200 cells/uL Final   Neutrophils Relative % 11/22/2020 53.4  % Final   Total Lymphocyte 11/22/2020 31.7  % Final   Monocytes Relative 11/22/2020 8.4  % Final   Eosinophils Relative 11/22/2020 5.9  % Final   Basophils Relative 11/22/2020 0.6  % Final   TSH 11/22/2020 1.68  0.40 - 4.50 mIU/L Final   Immunizations are up-to-date Immunization History  Administered Date(s) Administered   Influenza Split 01/28/2013   Influenza,inj,Quad PF,6+ Mos 04/11/2017, 01/23/2018, 02/09/2019   Influenza,inj,quad, With Preservative 04/11/2017   Influenza-Unspecified 04/30/2009, 01/29/2015, 02/09/2019, 02/02/2020   PFIZER Comirnaty(Gray Top)Covid-19 Tri-Sucrose Vaccine 10/10/2020   PFIZER(Purple Top)SARS-COV-2 Vaccination 08/07/2019, 08/31/2019, 03/22/2020   Pneumococcal Polysaccharide-23 01/28/2013   Tdap 04/30/2005, 07/27/2015   Cancer screening is up-to-date except for the mammogram which is already been scheduled.  Biggest issue for this patient is weight loss.  I encouraged her to cut sodas and soft drinks out of her diet.  Try to get 30 minutes a day of aerobic exercise.  I believe by treating depression, I hopefully can help with motivation to help her lose weight.

## 2020-12-17 ENCOUNTER — Other Ambulatory Visit: Payer: Self-pay | Admitting: Family Medicine

## 2020-12-29 ENCOUNTER — Other Ambulatory Visit: Payer: Self-pay

## 2020-12-29 ENCOUNTER — Ambulatory Visit
Admission: RE | Admit: 2020-12-29 | Discharge: 2020-12-29 | Disposition: A | Discharge status: Home / Self Care | Payer: 59 | Source: Ambulatory Visit | Attending: Family Medicine | Admitting: Family Medicine

## 2020-12-29 DIAGNOSIS — Z1231 Encounter for screening mammogram for malignant neoplasm of breast: Secondary | ICD-10-CM

## 2021-02-05 ENCOUNTER — Other Ambulatory Visit: Payer: Self-pay | Admitting: Family Medicine

## 2021-02-14 ENCOUNTER — Other Ambulatory Visit: Payer: Self-pay | Admitting: Family Medicine

## 2021-05-16 ENCOUNTER — Telehealth: Payer: Self-pay

## 2021-05-16 NOTE — Telephone Encounter (Signed)
Pt called inquiring about a bill she received from [facility]. Pt states that she has spoke with her insurance and they have asked if she would call the this office to have claim resubmitted to [facility]. Pt info for [employer]:  Inv: [id number] Amount: $[id number] Insur: Medicare Best Contact: [phone]

## 2021-06-01 NOTE — Telephone Encounter (Signed)
Sent msg to [place] at [facility].

## 2021-06-05 NOTE — Telephone Encounter (Signed)
Patient is aware that insurance information has been sent to [place] at [facility] and she can disregard the bill at this time.

## 2021-06-20 ENCOUNTER — Ambulatory Visit (INDEPENDENT_AMBULATORY_CARE_PROVIDER_SITE_OTHER): Payer: Medicare Other | Admitting: Family Medicine

## 2021-06-20 ENCOUNTER — Encounter: Payer: Self-pay | Admitting: Family Medicine

## 2021-06-20 ENCOUNTER — Other Ambulatory Visit: Payer: Self-pay

## 2021-06-20 VITALS — BP 152/98 | HR 65 | Temp 97.5°F | Resp 18 | Ht 60.0 in | Wt 255.0 lb

## 2021-06-20 DIAGNOSIS — R55 Syncope and collapse: Secondary | ICD-10-CM | POA: Diagnosis not present

## 2021-06-20 LAB — CBC WITH DIFFERENTIAL/PLATELET
Absolute Monocytes: 770 cells/uL (ref 200–950)
Basophils Absolute: 73 cells/uL (ref 0–200)
Basophils Relative: 0.7 %
Eosinophils Absolute: 499 cells/uL (ref 15–500)
Eosinophils Relative: 4.8 %
HCT: 41.1 % (ref 35.0–45.0)
Hemoglobin: 13.5 g/dL (ref 11.7–15.5)
Lymphs Abs: 2174 cells/uL (ref 850–3900)
MCH: 28.6 pg (ref 27.0–33.0)
MCHC: 32.8 g/dL (ref 32.0–36.0)
MCV: 87.1 fL (ref 80.0–100.0)
MPV: 11.6 fL (ref 7.5–12.5)
Monocytes Relative: 7.4 %
Neutro Abs: 6885 cells/uL (ref 1500–7800)
Neutrophils Relative %: 66.2 %
Platelets: 319 10*3/uL (ref 140–400)
RBC: 4.72 10*6/uL (ref 3.80–5.10)
RDW: 13.5 % (ref 11.0–15.0)
Total Lymphocyte: 20.9 %
WBC: 10.4 10*3/uL (ref 3.8–10.8)

## 2021-06-20 LAB — COMPLETE METABOLIC PANEL WITH GFR
AG Ratio: 1.8 (calc) (ref 1.0–2.5)
ALT: 16 U/L (ref 6–29)
AST: 18 U/L (ref 10–35)
Albumin: 4.1 g/dL (ref 3.6–5.1)
Alkaline phosphatase (APISO): 120 U/L (ref 37–153)
BUN: 16 mg/dL (ref 7–25)
CO2: 29 mmol/L (ref 20–32)
Calcium: 9.2 mg/dL (ref 8.6–10.4)
Chloride: 101 mmol/L (ref 98–110)
Creat: 0.86 mg/dL (ref 0.50–1.05)
Globulin: 2.3 g/dL (calc) (ref 1.9–3.7)
Glucose, Bld: 82 mg/dL (ref 65–99)
Potassium: 4.1 mmol/L (ref 3.5–5.3)
Sodium: 138 mmol/L (ref 135–146)
Total Bilirubin: 0.6 mg/dL (ref 0.2–1.2)
Total Protein: 6.4 g/dL (ref 6.1–8.1)
eGFR: 77 mL/min/{1.73_m2} (ref >=60)

## 2021-06-20 NOTE — Progress Notes (Signed)
Subjective:    Patient ID: [name] [name], female    DOB: Dec 26, 1960, 61 y.o.   MRN: [id number]  Patient states that over the last 2 weeks, several episodes where she became extremely lightheaded.  Denies any palpitations.  She denies any chest pain.  She denies any shortness of breath with.  The last 2 events occurred after she used the bathroom.  She was walking out of the bathroom when suddenly she became lightheaded.  She felt like her "feet were going to step through the floor".  She broke out in a diffuse sweat all over her body.  Symptoms lasted several minutes and then resolved spontaneously.  She never lost consciousness vertigo.  She states that she has had vertigo several times and this is not vertigo.  She has had 3 other events that occurred that were not associated with urination or defecation.  1 time was getting out of a hot shower.  Again she felt extremely lightheaded.  She never passed out but she did feel like she was possibly going to faint.  She again broke out in a diffuse all over her body.  One event she checked her blood sugar and it was in the 80s. Past Medical History:  Diagnosis Date   Anxiety 2008   seen in ED /w chest pain but told that she was having anxiety   Arthritis    History of kidney stones 1999   History of migraine    History of stress test 2010   done due to chest pain /w Dr. Jacinto Halim. Pt. told that it was wnl.   Hives of unknown origin    told by PCP Terri Piedra) to use Zrytec BID to control hives    Hypertension    Neuromuscular disorder (HCC)    back pain   Sleep difficulties    has had a sleep study - t old that she didn't meet criteria for machine    Syncope    due to pain   Past Surgical History:  Procedure Laterality Date   COLONOSCOPY  2016   DECOMPRESSIVE LUMBAR LAMINECTOMY LEVEL 1 N/A 10/28/2013   Procedure: DECOMPRESSION L4-5;  Surgeon: Venita Lick, MD;  Location: [facility];  Service: Orthopedics;  Laterality: N/A;   KNEE ARTHROPLASTY Right  12/24/2018   Procedure: COMPUTER ASSISTED TOTAL KNEE ARTHROPLASTY;  Surgeon: Samson Frederic, MD;  Location: [facility];  Service: Orthopedics;  Laterality: Right;   KNEE ARTHROPLASTY Left 09/17/2019   Procedure: COMPUTER ASSISTED TOTAL KNEE ARTHROPLASTY;  Surgeon: Samson Frederic, MD;  Location: [facility];  Service: Orthopedics;  Laterality: Left;   KNEE ARTHROSCOPY  12/2008   left knee   KNEE ARTHROSCOPY Right 01/27/2018   PARTIAL HYSTERECTOMY  10/1994   thumb surgery Right 03/1975   Current Outpatient Medications on File Prior to Visit  Medication Sig Dispense Refill   ALPRAZolam (XANAX) 0.5 MG tablet Take 1 tablet (0.5 mg total) by mouth 2 (two) times daily as needed for anxiety or sleep. 30 tablet 0   atorvastatin (LIPITOR) 20 MG tablet TAKE 1 TABLET BY MOUTH EVERY DAY 90 tablet 0   celecoxib (CELEBREX) 200 MG capsule Take 200 mg by mouth daily.     cetirizine (ZYRTEC) 10 MG tablet Take 10 mg by mouth daily.      diltiazem (CARDIZEM CD) 120 MG 24 hr capsule TAKE 1 CAPSULE BY MOUTH EVERY DAY 90 capsule 2   escitalopram (LEXAPRO) 10 MG tablet TAKE 1 TABLET BY MOUTH EVERY DAY 90 tablet  2   hydrochlorothiazide (MICROZIDE) 12.5 MG capsule TAKE 1 CAPSULE BY MOUTH EVERY DAY 90 capsule 0   Multiple Vitamin (MULTIVITAMIN) tablet Take 1 tablet by mouth daily.     methocarbamol (ROBAXIN) 750 MG tablet Take 750 mg by mouth 3 (three) times daily. (Patient not taking: Reported on 06/20/2021)     Current Facility-Administered Medications on File Prior to Visit  Medication Dose Route Frequency Provider Last Rate Last Admin   gadopentetate dimeglumine (MAGNEVIST) injection 20 mL  20 mL Intravenous Once PRN Sater, Pearletha Furl, MD       Allergies  Allergen Reactions   Gabapentin     Dizziness   Other     American cockroaches   Penicillin G     Other reaction(s): hives   Penicillins Hives and Rash    Did it involve swelling of the face/tongue/throat, SOB, or low BP? No Did it involve sudden or severe  rash/hives, skin peeling, or any reaction on the inside of your mouth or nose? No Did you need to seek medical attention at a hospital or doctor's office? Yes When did it last happen?      Childhood allergy If all above answers are NO, may proceed with cephalosporin use.    Social History   Socioeconomic History   Marital status: Single    Spouse name: Not on file   Number of children: 0   Years of education: Not on file   Highest education level: Not on file  Occupational History   Occupation: Radiographer, therapeutic  Tobacco Use   Smoking status: Never   Smokeless tobacco: Never  Vaping Use   Vaping Use: Never used  Substance and Sexual Activity   Alcohol use: Yes    Comment: rare   Drug use: No   Sexual activity: Not on file  Other Topics Concern   Not on file  Social History Narrative   Not on file   Social Determinants of Health   Financial Resource Strain: Not on file  Food Insecurity: Not on file  Transportation Needs: Not on file  Physical Activity: Not on file  Stress: Not on file  Social Connections: Not on file  Intimate Partner Violence: Not on file   Family History  Problem Relation Age of Onset   Stroke Father    Diabetes Other    Hyperlipidemia Other    Heart disease Other    Hypertension Other    Cancer Other    Breast cancer Neg Hx       Review of Systems  Neurological:  Positive for dizziness.      Objective:   Physical Exam Constitutional:      General: She is not in acute distress.    Appearance: Normal appearance. She is obese. She is not ill-appearing, toxic-appearing or diaphoretic.  HENT:     Head: Normocephalic and atraumatic.     Right Ear: Tympanic membrane and ear canal normal.     Left Ear: Tympanic membrane and ear canal normal.     Nose: No congestion or rhinorrhea.     Mouth/Throat:     Mouth: Mucous membranes are moist.     Pharynx: Oropharynx is clear. No oropharyngeal exudate or posterior oropharyngeal  erythema.  Eyes:     Extraocular Movements: Extraocular movements intact.     Conjunctiva/sclera: Conjunctivae normal.     Pupils: Pupils are equal, round, and reactive to light.  Neck:     Vascular: No carotid bruit.  Cardiovascular:  Rate and Rhythm: Normal rate and regular rhythm.     Pulses: Normal pulses.     Heart sounds: Normal heart sounds. No murmur heard.   No friction rub. No gallop.  Pulmonary:     Effort: Pulmonary effort is normal. No respiratory distress.     Breath sounds: Normal breath sounds. No stridor. No wheezing, rhonchi or rales.  Abdominal:     General: Bowel sounds are normal. There is no distension.     Palpations: There is no mass.     Tenderness: There is no abdominal tenderness. There is no right CVA tenderness, left CVA tenderness, guarding or rebound.     Hernia: No hernia is present.  Musculoskeletal:        General: No swelling.     Cervical back: Normal range of motion and neck supple. No rigidity.     Right lower leg: Edema present.     Left lower leg: Edema present.  Lymphadenopathy:     Cervical: No cervical adenopathy.  Skin:    General: Skin is warm.     Coloration: Skin is not jaundiced or pale.     Findings: No bruising, erythema, lesion or rash.  Neurological:     General: No focal deficit present.     Mental Status: She is alert and oriented to person, place, and time. Mental status is at baseline.     Cranial Nerves: No cranial nerve deficit.     Sensory: No sensory deficit.     Motor: No weakness.     Coordination: Coordination normal.  Psychiatric:        Mood and Affect: Mood normal.        Behavior: Behavior normal.        Thought Content: Thought content normal.        Judgment: Judgment normal.      Near syncope - Plan: CBC with Differential/Platelet, COMPLETE METABOLIC PANEL WITH GFR I believe the patient is having near syncopal episodes brought on by vasovagal events.  2 of the events were brought on by going to  the restroom.  One of the events was triggered by heat in the shower.  Therefore, 3 out of the 5 minutes were triggered by classic vasovagal activities.  The other 2 are unexplained.  Therefore I recommended cardiology consultation for an echocardiogram as well as an event monitor to determine the source of her lightheadedness.  Dix-Hallpike maneuver is negative bilaterally today and there is no evidence of vertigo.  There is no neurologic deficit on exam to suggest an.  Therefore I believe that her events are triggered by cerebral hypoperfusion most likely due to vasovagal event.  Check a CBC to rule out anemia and check a CMP to rule out any electrolyte abnormalities or dehydration

## 2021-06-23 ENCOUNTER — Ambulatory Visit (INDEPENDENT_AMBULATORY_CARE_PROVIDER_SITE_OTHER): Payer: 59 | Admitting: Cardiology

## 2021-06-23 ENCOUNTER — Other Ambulatory Visit: Payer: Self-pay | Admitting: Family Medicine

## 2021-06-23 ENCOUNTER — Other Ambulatory Visit: Payer: Self-pay

## 2021-06-23 ENCOUNTER — Encounter: Payer: Self-pay | Admitting: Cardiology

## 2021-06-23 VITALS — BP 162/81 | HR 69 | Ht 60.0 in | Wt 252.2 lb

## 2021-06-23 DIAGNOSIS — I10 Essential (primary) hypertension: Secondary | ICD-10-CM

## 2021-06-23 DIAGNOSIS — R55 Syncope and collapse: Secondary | ICD-10-CM

## 2021-06-23 DIAGNOSIS — R001 Bradycardia, unspecified: Secondary | ICD-10-CM

## 2021-06-23 MED ORDER — HYDROCHLOROTHIAZIDE 12.5 MG PO CAPS: ORAL_CAPSULE | ORAL | 3 refills | Status: DC

## 2021-06-23 NOTE — Progress Notes (Signed)
Cardiology Office Note   Date:  06/23/2021   ID:  Saiya, Crist [birth year], MRN [id number]  PCP:  Donita Brooks, MD  Cardiologist:   Chisom Muntean [place], MD   Chief Complaint  Patient presents with   Near Syncope      History of Present Illness: [name] [name] is a 61 y.o. female who is seen at the request of Dr Tanya Nones for evaluation of near syncope.  She was previously seen in 2017 for similar complaints. She is a morbidly obese WF with history of HTN.  Episodes of syncope date back at least to 2010. She had an Echo and stress test at that time with Dr. Jacinto Halim. She denies any SOB or chest pain. No other palpitations. She was seen by Dr. Graciela Husbands in December 2016 who felt her symptoms were consistent with neurocardiogenic syncope. She describes a tilt table test but I do not see a report of this. Before she stopped taking Neurotin for back pain her dizziness improved.   She reports she hasn't had any dizziness since 2017 until a few weeks ago she had 5 episodes within 2 weeks. She feels sudden lightheadedness and flushing with getting up. Associated with profuse sweating. Usually gets something to drink and sits down. Symptoms resolve in about 15 minutes- often followed by HA. No recent change in meds. Does have a lot of stress. Significant weight gain in last 2 years which she attributes to stress eating.   Past Medical History:  Diagnosis Date   Anxiety 2008   seen in ED /w chest pain but told that she was having anxiety   Arthritis    History of kidney stones 1999   History of migraine    History of stress test 2010   done due to chest pain /w Dr. Jacinto Halim. Pt. told that it was wnl.   Hives of unknown origin    told by PCP Terri Piedra) to use Zrytec BID to control hives    Hypertension    Neuromuscular disorder (HCC)    back pain   Sleep difficulties    has had a sleep study - t old that she didn't meet criteria for machine    Syncope    due to pain    Past Surgical  History:  Procedure Laterality Date   COLONOSCOPY  2016   DECOMPRESSIVE LUMBAR LAMINECTOMY LEVEL 1 N/A 10/28/2013   Procedure: DECOMPRESSION L4-5;  Surgeon: Venita Lick, MD;  Location: [facility];  Service: Orthopedics;  Laterality: N/A;   KNEE ARTHROPLASTY Right 12/24/2018   Procedure: COMPUTER ASSISTED TOTAL KNEE ARTHROPLASTY;  Surgeon: Samson Frederic, MD;  Location: [facility];  Service: Orthopedics;  Laterality: Right;   KNEE ARTHROPLASTY Left 09/17/2019   Procedure: COMPUTER ASSISTED TOTAL KNEE ARTHROPLASTY;  Surgeon: Samson Frederic, MD;  Location: [facility];  Service: Orthopedics;  Laterality: Left;   KNEE ARTHROSCOPY  12/2008   left knee   KNEE ARTHROSCOPY Right 01/27/2018   PARTIAL HYSTERECTOMY  10/1994   thumb surgery Right 03/1975     Current Outpatient Medications  Medication Sig Dispense Refill   ALPRAZolam (XANAX) 0.5 MG tablet Take 1 tablet (0.5 mg total) by mouth 2 (two) times daily as needed for anxiety or sleep. 30 tablet 0   atorvastatin (LIPITOR) 20 MG tablet TAKE 1 TABLET BY MOUTH EVERY DAY 90 tablet 0   celecoxib (CELEBREX) 200 MG capsule Take 200 mg by mouth daily.     cetirizine (ZYRTEC) 10 MG tablet Take 10  mg by mouth daily.      diltiazem (CARDIZEM CD) 120 MG 24 hr capsule TAKE 1 CAPSULE BY MOUTH EVERY DAY 90 capsule 2   escitalopram (LEXAPRO) 10 MG tablet TAKE 1 TABLET BY MOUTH EVERY DAY 90 tablet 2   hydrochlorothiazide (MICROZIDE) 12.5 MG capsule TAKE 1 CAPSULE BY MOUTH EVERY DAY 90 capsule 0   Multiple Vitamin (MULTIVITAMIN) tablet Take 1 tablet by mouth daily.     No current facility-administered medications for this visit.   Facility-Administered Medications Ordered in Other Visits  Medication Dose Route Frequency Provider Last Rate Last Admin   gadopentetate dimeglumine (MAGNEVIST) injection 20 mL  20 mL Intravenous Once PRN Sater, Pearletha Furl, MD        Allergies:   Gabapentin, Other, Penicillin g, and Penicillins    Social History:  The patient  reports that  she has never smoked. She has never used smokeless tobacco. She reports current alcohol use. She reports that she does not use drugs.   Family History:  The patient's family history includes Cancer in an other family member; Diabetes in an other family member; Heart disease in an other family member; Hyperlipidemia in an other family member; Hypertension in an other family member; Stroke in her father.    ROS:  Please see the history of present illness.    All other systems are reviewed and negative.    PHYSICAL EXAM: VS:  BP (!) 162/81    Pulse 69    Ht 5' (1.524 m)    Wt 252 lb 3.2 oz (114.4 kg)    SpO2 97%    BMI 49.25 kg/m  , BMI Body mass index is 49.25 kg/m. GEN: morbidly obese WF, in no acute distress HEENT: normal Neck: no JVD, carotid bruits, or masses Cardiac: RRR; no murmurs, rubs, or gallops,no edema  Respiratory:  clear to auscultation bilaterally, normal work of breathing GI: soft, nontender, nondistended, + BS MS: no deformity or atrophy Skin: warm and dry, no rash Neuro:  Strength and sensation are intact Psych: euthymic mood, full affect   EKG:  EKG is ordered today.NSR rate 69. Incomplete RBBB. I have personally reviewed and interpreted this study.     Recent Labs: 11/22/2020: TSH 1.68 06/20/2021: ALT 16; BUN 16; Creat 0.86; Hemoglobin 13.5; Platelets 319; Potassium 4.1; Sodium 138    Lipid Panel    Component Value Date/Time   CHOL 134 11/22/2020 0808   TRIG 99 11/22/2020 0808   HDL 52 11/22/2020 0808   CHOLHDL 2.6 11/22/2020 0808   LDLCALC 64 11/22/2020 0808      Wt Readings from Last 3 Encounters:  06/23/21 252 lb 3.2 oz (114.4 kg)  06/20/21 255 lb (115.7 kg)  11/24/20 247 lb (112 kg)      Other studies Reviewed: Additional studies/ records that were reviewed today include: none. Review of the above records demonstrates: N/A   ASSESSMENT AND PLAN:  1.  Dizziness/near syncope. Prior evaluation reviewed. Again her symptoms are c/w vasovagal  episodes. No recent change in meds. Recommend she lie down immediately when this occurs. Maintain good hydration. It would be helpful for her to check her HR and BP when this occurs. I doubt think any additional cardiac evaluation is needed now. She will let me know if symptoms worsen.  2. Morbid obesity. Reports no history of sleep apnea by sleep study in 2015. Significant weight gain in last 2 years. Encourage weight loss.   3. HTN BP elevated today but patient reports  good readings at home. Continue Rx.    Current medicines are reviewed at length with the patient today.  The patient does not have concerns regarding medicines.  The following changes have been made:  no change  Labs/ tests ordered today include:  No orders of the defined types were placed in this encounter.    Disposition:   FU with me PRN  Signed, Shanekqua Schaper [place], MD  06/23/2021 10:04 AM    [facility] [address], [place], [place], [address] Phone [phone], Fax [phone]

## 2021-07-13 ENCOUNTER — Other Ambulatory Visit: Payer: Self-pay | Admitting: Family Medicine

## 2021-07-18 DIAGNOSIS — M25561 Pain in right knee: Secondary | ICD-10-CM | POA: Insufficient documentation

## 2021-07-23 DIAGNOSIS — M7051 Other bursitis of knee, right knee: Secondary | ICD-10-CM | POA: Insufficient documentation

## 2021-09-04 ENCOUNTER — Telehealth (HOSPITAL_BASED_OUTPATIENT_CLINIC_OR_DEPARTMENT_OTHER): Payer: Self-pay | Admitting: Physical Therapy

## 2021-09-04 NOTE — Telephone Encounter (Signed)
Contacted patient regarding PT prescription. Patient already taking therapy elsewhere.  ?

## 2021-09-15 ENCOUNTER — Ambulatory Visit (INDEPENDENT_AMBULATORY_CARE_PROVIDER_SITE_OTHER): Payer: Medicare Other | Admitting: Family Medicine

## 2021-09-15 VITALS — BP 130/90 | HR 65 | Temp 97.8°F | Ht 61.0 in | Wt 252.8 lb

## 2021-09-15 DIAGNOSIS — R112 Nausea with vomiting, unspecified: Secondary | ICD-10-CM

## 2021-09-15 DIAGNOSIS — R6881 Early satiety: Secondary | ICD-10-CM

## 2021-09-15 MED ORDER — PANTOPRAZOLE SODIUM 40 MG PO TBEC: 40.0000 mg | DELAYED_RELEASE_TABLET | Freq: Every day | ORAL | 3 refills | Status: DC

## 2021-09-15 MED ORDER — CLONAZEPAM 0.5 MG PO TABS: 0.5000 mg | ORAL_TABLET | Freq: Two times a day (BID) | ORAL | 1 refills | Status: DC | PRN

## 2021-09-15 NOTE — Progress Notes (Signed)
Subjective:    Patient ID: [name] [name], female    DOB: 1960-10-22, 61 y.o.   MRN: [id number] 06/20/21 Patient states that over the last 2 weeks, several episodes where she became extremely lightheaded.  Denies any palpitations.  She denies any chest pain.  She denies any shortness of breath with.  The last 2 events occurred after she used the bathroom.  She was walking out of the bathroom when suddenly she became lightheaded.  She felt like her "feet were going to step through the floor".  She broke out in a diffuse sweat all over her body.  Symptoms lasted several minutes and then resolved spontaneously.  She never lost consciousness.  She states that she has had vertigo several times and this is not vertigo.  She has had 3 other events that occurred that were not associated with urination or defecation.  1 time was getting out of a hot shower.  Again she felt extremely lightheaded.  She never passed out but she did feel like she was possibly going to faint.  She again broke out in a diffuse all over her body.  One event she checked her blood sugar and it was in the 80s.  At that time, my plan was: I believe the patient is having near syncopal episodes brought on by vasovagal events.  2 of the events were brought on by going to the restroom.  One of the events was triggered by heat in the shower.  Therefore, 3 out of the 5 minutes were triggered by classic vasovagal activities.  The other 2 are unexplained.  Therefore I recommended cardiology consultation for an echocardiogram as well as an event monitor to determine the source of her lightheadedness.  Dix-Hallpike maneuver is negative bilaterally today and there is no evidence of vertigo.  There is no neurologic deficit on exam to suggest an.  Therefore I believe that her events are triggered by cerebral hypoperfusion most likely due to vasovagal event.  Check a CBC to rule out anemia and check a CMP to rule out any electrolyte abnormalities or  dehydration  09/15/21 Cardiology saw her on 2/24 and I copied their A/P below:   "ASSESSMENT AND PLAN:  1.  Dizziness/near syncope. Prior evaluation reviewed. Again her symptoms are c/w vasovagal episodes. No recent change in meds. Recommend she lie down immediately when this occurs. Maintain good hydration. It would be helpful for her to check her HR and BP when this occurs. I doubt think any additional cardiac evaluation is needed now. She will let me know if symptoms worsen.  2. Morbid obesity. Reports no history of sleep apnea by sleep study in 2015. Significant weight gain in last 2 years. Encourage weight loss.   3. HTN BP elevated today but patient reports good readings at home. Continue Rx.   Patient states that starting 2 weeks ago she developed early satiety and nausea.  She states she just does not feel like eating.  She feels nauseated and fullness and if she starts eating.  She has vomited on a few occasions.  She also reports 4-5 episodes of loose stool per day.  She denies any melena or hematochezia.  She states that this has her extremely worried.  At times she has trouble breathing.  She feels a pain in her chest and in her back.  This seems to be associated with anxiety.  She feels her heart racing.  She will then check her heart rate will be 70.  Its not actually beating fast but she feels like it is running away from her.  She denies any fevers or chills.  She denies any abdominal pain.  She denies any weight loss.  She denies any vaginal bleeding or hematuria.  She denies any dysuria Past Medical History:  Diagnosis Date   Anxiety 2008   seen in ED /w chest pain but told that she was having anxiety   Arthritis    History of kidney stones 1999   History of migraine    History of stress test 2010   done due to chest pain /w Dr. Jacinto Halim. Pt. told that it was wnl.   Hives of unknown origin    told by PCP Terri Piedra) to use Zrytec BID to control hives    Hypertension     Neuromuscular disorder (HCC)    back pain   Sleep difficulties    has had a sleep study - t old that she didn't meet criteria for machine    Syncope    due to pain   Past Surgical History:  Procedure Laterality Date   COLONOSCOPY  2016   DECOMPRESSIVE LUMBAR LAMINECTOMY LEVEL 1 N/A 10/28/2013   Procedure: DECOMPRESSION L4-5;  Surgeon: Venita Lick, MD;  Location: [facility];  Service: Orthopedics;  Laterality: N/A;   KNEE ARTHROPLASTY Right 12/24/2018   Procedure: COMPUTER ASSISTED TOTAL KNEE ARTHROPLASTY;  Surgeon: Samson Frederic, MD;  Location: [facility];  Service: Orthopedics;  Laterality: Right;   KNEE ARTHROPLASTY Left 09/17/2019   Procedure: COMPUTER ASSISTED TOTAL KNEE ARTHROPLASTY;  Surgeon: Samson Frederic, MD;  Location: [facility];  Service: Orthopedics;  Laterality: Left;   KNEE ARTHROSCOPY  12/2008   left knee   KNEE ARTHROSCOPY Right 01/27/2018   PARTIAL HYSTERECTOMY  10/1994   thumb surgery Right 03/1975   Current Outpatient Medications on File Prior to Visit  Medication Sig Dispense Refill   ALPRAZolam (XANAX) 0.5 MG tablet Take 1 tablet (0.5 mg total) by mouth 2 (two) times daily as needed for anxiety or sleep. 30 tablet 0   atorvastatin (LIPITOR) 20 MG tablet TAKE 1 TABLET BY MOUTH EVERY DAY 90 tablet 3   celecoxib (CELEBREX) 200 MG capsule Take 200 mg by mouth daily.     cetirizine (ZYRTEC) 10 MG tablet Take 10 mg by mouth daily.      diltiazem (CARDIZEM CD) 120 MG 24 hr capsule TAKE 1 CAPSULE BY MOUTH EVERY DAY 90 capsule 2   escitalopram (LEXAPRO) 10 MG tablet TAKE 1 TABLET BY MOUTH EVERY DAY 90 tablet 2   hydrochlorothiazide (MICROZIDE) 12.5 MG capsule TAKE 1 CAPSULE BY MOUTH EVERY DAY 90 capsule 3   Multiple Vitamin (MULTIVITAMIN) tablet Take 1 tablet by mouth daily.     Current Facility-Administered Medications on File Prior to Visit  Medication Dose Route Frequency Provider Last Rate Last Admin   gadopentetate dimeglumine (MAGNEVIST) injection 20 mL  20 mL Intravenous Once  PRN Sater, Pearletha Furl, MD       Allergies  Allergen Reactions   Gabapentin     Dizziness   Other     American cockroaches   Penicillin G     Other reaction(s): hives   Penicillins Hives and Rash    Did it involve swelling of the face/tongue/throat, SOB, or low BP? No Did it involve sudden or severe rash/hives, skin peeling, or any reaction on the inside of your mouth or nose? No Did you need to seek medical attention at a hospital or doctor's  office? Yes When did it last happen?      Childhood allergy If all above answers are "NO", may proceed with cephalosporin use.    Social History   Socioeconomic History   Marital status: Single    Spouse name: Not on file   Number of children: 0   Years of education: Not on file   Highest education level: Not on file  Occupational History   Occupation: Radiographer, therapeuticmanufacturing technician  Tobacco Use   Smoking status: Never   Smokeless tobacco: Never  Vaping Use   Vaping Use: Never used  Substance and Sexual Activity   Alcohol use: Yes    Comment: rare   Drug use: No   Sexual activity: Not on file  Other Topics Concern   Not on file  Social History Narrative   Not on file   Social Determinants of Health   Financial Resource Strain: Not on file  Food Insecurity: Not on file  Transportation Needs: Not on file  Physical Activity: Not on file  Stress: Not on file  Social Connections: Not on file  Intimate Partner Violence: Not on file   Family History  Problem Relation Age of Onset   Stroke Father    Diabetes Other    Hyperlipidemia Other    Heart disease Other    Hypertension Other    Cancer Other    Breast cancer Neg Hx       Review of Systems  Neurological:  Positive for dizziness.      Objective:   Physical Exam Constitutional:      General: She is not in acute distress.    Appearance: Normal appearance. She is obese. She is not ill-appearing, toxic-appearing or diaphoretic.  HENT:     Head: Normocephalic and  atraumatic.     Right Ear: Tympanic membrane and ear canal normal.     Left Ear: Tympanic membrane and ear canal normal.     Nose: No congestion or rhinorrhea.     Mouth/Throat:     Mouth: Mucous membranes are moist.     Pharynx: Oropharynx is clear. No oropharyngeal exudate or posterior oropharyngeal erythema.  Eyes:     Extraocular Movements: Extraocular movements intact.     Conjunctiva/sclera: Conjunctivae normal.     Pupils: Pupils are equal, round, and reactive to light.  Neck:     Vascular: No carotid bruit.  Cardiovascular:     Rate and Rhythm: Normal rate and regular rhythm.     Pulses: Normal pulses.     Heart sounds: Normal heart sounds. No murmur heard.   No friction rub. No gallop.  Pulmonary:     Effort: Pulmonary effort is normal. No respiratory distress.     Breath sounds: Normal breath sounds. No stridor. No wheezing, rhonchi or rales.  Abdominal:     General: Bowel sounds are normal. There is no distension.     Palpations: There is no mass.     Tenderness: There is no abdominal tenderness. There is no right CVA tenderness, left CVA tenderness, guarding or rebound.     Hernia: No hernia is present.  Musculoskeletal:        General: No swelling.     Cervical back: Normal range of motion and neck supple. No rigidity.     Right lower leg: Edema present.     Left lower leg: Edema present.  Lymphadenopathy:     Cervical: No cervical adenopathy.  Skin:    General: Skin is warm.  Coloration: Skin is not jaundiced or pale.     Findings: No bruising, erythema, lesion or rash.  Neurological:     General: No focal deficit present.     Mental Status: She is alert and oriented to person, place, and time. Mental status is at baseline.     Cranial Nerves: No cranial nerve deficit.     Sensory: No sensory deficit.     Motor: No weakness.     Coordination: Coordination normal.  Psychiatric:        Mood and Affect: Mood normal.        Behavior: Behavior normal.         Thought Content: Thought content normal.        Judgment: Judgment normal.     Nausea and vomiting, unspecified vomiting type - Plan: CBC with Differential/Platelet, COMPLETE METABOLIC PANEL WITH GFR, Lipase  Early satiety Patient continues to have vasovagal episodes and now has early satiety with nausea and bloating.  Also believes she is having panic attacks when she feels like she cannot breathe and has subjective sensations of a racing heartbeat even though her blood pressure and heart rate are normal when she mechanically checks them.  I truly believe this is anxiety related.  We discussed getting a CT scan of the abdomen and pelvis we will consult the GI regarding EGD.  Differential diagnosis includes gastritis, peptic ulcer disease, gastroparesis, chronic pancreatitis or pancreatic insufficiency.  However I believe this is most likely anxiety related.  I recommended trying Klonopin 0.5 mg twice daily on a scheduled basis to see if we can better control her anxiety and then reassess in 1 week to see if her symptoms are improving.  Also recommended that she temporarily hold Celebrex and start pantoprazole 40 mg a day for possible gastritis.  Check CBC CMP and lipase.  If not improving, I will consult GI and arrange imaging.

## 2021-09-16 LAB — COMPLETE METABOLIC PANEL WITH GFR
AG Ratio: 1.7 (calc) (ref 1.0–2.5)
ALT: 19 U/L (ref 6–29)
AST: 22 U/L (ref 10–35)
Albumin: 4 g/dL (ref 3.6–5.1)
Alkaline phosphatase (APISO): 115 U/L (ref 37–153)
BUN: 14 mg/dL (ref 7–25)
CO2: 25 mmol/L (ref 20–32)
Calcium: 8.9 mg/dL (ref 8.6–10.4)
Chloride: 106 mmol/L (ref 98–110)
Creat: 0.78 mg/dL (ref 0.50–1.05)
Globulin: 2.3 g/dL (calc) (ref 1.9–3.7)
Glucose, Bld: 90 mg/dL (ref 65–99)
Potassium: 4.5 mmol/L (ref 3.5–5.3)
Sodium: 142 mmol/L (ref 135–146)
Total Bilirubin: 0.6 mg/dL (ref 0.2–1.2)
Total Protein: 6.3 g/dL (ref 6.1–8.1)
eGFR: 86 mL/min/{1.73_m2} (ref >=60)

## 2021-09-16 LAB — CBC WITH DIFFERENTIAL/PLATELET
Absolute Monocytes: 730 cells/uL (ref 200–950)
Basophils Absolute: 70 cells/uL (ref 0–200)
Basophils Relative: 0.8 %
Eosinophils Absolute: 414 cells/uL (ref 15–500)
Eosinophils Relative: 4.7 %
HCT: 37.8 % (ref 35.0–45.0)
Hemoglobin: 12.3 g/dL (ref 11.7–15.5)
Lymphs Abs: 2191 cells/uL (ref 850–3900)
MCH: 27.5 pg (ref 27.0–33.0)
MCHC: 32.5 g/dL (ref 32.0–36.0)
MCV: 84.6 fL (ref 80.0–100.0)
MPV: 11.2 fL (ref 7.5–12.5)
Monocytes Relative: 8.3 %
Neutro Abs: 5394 cells/uL (ref 1500–7800)
Neutrophils Relative %: 61.3 %
Platelets: 340 10*3/uL (ref 140–400)
RBC: 4.47 10*6/uL (ref 3.80–5.10)
RDW: 13.6 % (ref 11.0–15.0)
Total Lymphocyte: 24.9 %
WBC: 8.8 10*3/uL (ref 3.8–10.8)

## 2021-09-16 LAB — LIPASE: Lipase: 26 U/L (ref 7–60)

## 2021-09-18 ENCOUNTER — Telehealth: Payer: Self-pay

## 2021-09-22 ENCOUNTER — Ambulatory Visit (INDEPENDENT_AMBULATORY_CARE_PROVIDER_SITE_OTHER): Payer: Medicare Other | Admitting: Family Medicine

## 2021-09-22 VITALS — BP 140/80 | HR 68 | Temp 97.7°F | Ht 61.0 in | Wt 254.2 lb

## 2021-09-22 DIAGNOSIS — R55 Syncope and collapse: Secondary | ICD-10-CM | POA: Diagnosis not present

## 2021-09-22 DIAGNOSIS — R112 Nausea with vomiting, unspecified: Secondary | ICD-10-CM

## 2021-09-22 DIAGNOSIS — F411 Generalized anxiety disorder: Secondary | ICD-10-CM

## 2021-09-22 MED ORDER — VENLAFAXINE HCL ER 75 MG PO CP24: 75.0000 mg | ORAL_CAPSULE | Freq: Every day | ORAL | 2 refills | Status: DC

## 2021-09-22 NOTE — Progress Notes (Signed)
Subjective:    Patient ID: [name] [name], female    DOB: 1960-10-22, 61 y.o.   MRN: [id number] 06/20/21 Patient states that over the last 2 weeks, several episodes where she became extremely lightheaded.  Denies any palpitations.  She denies any chest pain.  She denies any shortness of breath with.  The last 2 events occurred after she used the bathroom.  She was walking out of the bathroom when suddenly she became lightheaded.  She felt like her "feet were going to step through the floor".  She broke out in a diffuse sweat all over her body.  Symptoms lasted several minutes and then resolved spontaneously.  She never lost consciousness.  She states that she has had vertigo several times and this is not vertigo.  She has had 3 other events that occurred that were not associated with urination or defecation.  1 time was getting out of a hot shower.  Again she felt extremely lightheaded.  She never passed out but she did feel like she was possibly going to faint.  She again broke out in a diffuse all over her body.  One event she checked her blood sugar and it was in the 80s.  At that time, my plan was: I believe the patient is having near syncopal episodes brought on by vasovagal events.  2 of the events were brought on by going to the restroom.  One of the events was triggered by heat in the shower.  Therefore, 3 out of the 5 minutes were triggered by classic vasovagal activities.  The other 2 are unexplained.  Therefore I recommended cardiology consultation for an echocardiogram as well as an event monitor to determine the source of her lightheadedness.  Dix-Hallpike maneuver is negative bilaterally today and there is no evidence of vertigo.  There is no neurologic deficit on exam to suggest an.  Therefore I believe that her events are triggered by cerebral hypoperfusion most likely due to vasovagal event.  Check a CBC to rule out anemia and check a CMP to rule out any electrolyte abnormalities or  dehydration  09/15/21 Cardiology saw her on 2/24 and I copied their A/P below:   "ASSESSMENT AND PLAN:  1.  Dizziness/near syncope. Prior evaluation reviewed. Again her symptoms are c/w vasovagal episodes. No recent change in meds. Recommend she lie down immediately when this occurs. Maintain good hydration. It would be helpful for her to check her HR and BP when this occurs. I doubt think any additional cardiac evaluation is needed now. She will let me know if symptoms worsen.  2. Morbid obesity. Reports no history of sleep apnea by sleep study in 2015. Significant weight gain in last 2 years. Encourage weight loss.   3. HTN BP elevated today but patient reports good readings at home. Continue Rx.   Patient states that starting 2 weeks ago she developed early satiety and nausea.  She states she just does not feel like eating.  She feels nauseated and fullness and if she starts eating.  She has vomited on a few occasions.  She also reports 4-5 episodes of loose stool per day.  She denies any melena or hematochezia.  She states that this has her extremely worried.  At times she has trouble breathing.  She feels a pain in her chest and in her back.  This seems to be associated with anxiety.  She feels her heart racing.  She will then check her heart rate will be 70.  Its not actually beating fast but she feels like it is running away from her.  She denies any fevers or chills.  She denies any abdominal pain.  She denies any weight loss.  She denies any vaginal bleeding or hematuria.  She denies any dysuria.  At that time, my plan was:  Patient continues to have vasovagal episodes and now has early satiety with nausea and bloating.  Also believes she is having panic attacks when she feels like she cannot breathe and has subjective sensations of a racing heartbeat even though her blood pressure and heart rate are normal when she mechanically checks them.  I truly believe this is anxiety related.  We discussed  getting a CT scan of the abdomen and pelvis we will consult the GI regarding EGD.  Differential diagnosis includes gastritis, peptic ulcer disease, gastroparesis, chronic pancreatitis or pancreatic insufficiency.  However I believe this is most likely anxiety related.  I recommended trying Klonopin 0.5 mg twice daily on a scheduled basis to see if we can better control her anxiety and then reassess in 1 week to see if her symptoms are improving.  Also recommended that she temporarily hold Celebrex and start pantoprazole 40 mg a day for possible gastritis.  Check CBC CMP and lipase.  If not improving, I will consult GI and arrange imaging.  09/22/21 Patient states that she feels better this week.  She has not experienced any nausea.  She has not had any near syncopal episodes.  However she does feel sleepy on the Klonopin.  She has been on Lexapro for approximately 1 year.  She does not feel that Lexapro is benefiting her at all. Past Medical History:  Diagnosis Date   Anxiety 2008   seen in ED /w chest pain but told that she was having anxiety   Arthritis    History of kidney stones 1999   History of migraine    History of stress test 2010   done due to chest pain /w Dr. Jacinto HalimGanji. Pt. told that it was wnl.   Hives of unknown origin    told by PCP Terri Piedra( [place]) to use Zrytec BID to control hives    Hypertension    Neuromuscular disorder (HCC)    back pain   Sleep difficulties    has had a sleep study - t old that she didn't meet criteria for machine    Syncope    due to pain   Past Surgical History:  Procedure Laterality Date   COLONOSCOPY  2016   DECOMPRESSIVE LUMBAR LAMINECTOMY LEVEL 1 N/A 10/28/2013   Procedure: DECOMPRESSION L4-5;  Surgeon: Venita Lickahari Brooks, MD;  Location: [facility];  Service: Orthopedics;  Laterality: N/A;   KNEE ARTHROPLASTY Right 12/24/2018   Procedure: COMPUTER ASSISTED TOTAL KNEE ARTHROPLASTY;  Surgeon: Samson FredericSwinteck, Brian, MD;  Location: [facility];  Service: Orthopedics;  Laterality:  Right;   KNEE ARTHROPLASTY Left 09/17/2019   Procedure: COMPUTER ASSISTED TOTAL KNEE ARTHROPLASTY;  Surgeon: Samson FredericSwinteck, Brian, MD;  Location: [facility];  Service: Orthopedics;  Laterality: Left;   KNEE ARTHROSCOPY  12/2008   left knee   KNEE ARTHROSCOPY Right 01/27/2018   PARTIAL HYSTERECTOMY  10/1994   thumb surgery Right 03/1975   Current Outpatient Medications on File Prior to Visit  Medication Sig Dispense Refill   ALPRAZolam (XANAX) 0.5 MG tablet Take 1 tablet (0.5 mg total) by mouth 2 (two) times daily as needed for anxiety or sleep. 30 tablet 0   atorvastatin (LIPITOR) 20 MG tablet  TAKE 1 TABLET BY MOUTH EVERY DAY 90 tablet 3   celecoxib (CELEBREX) 200 MG capsule Take 200 mg by mouth daily.     cetirizine (ZYRTEC) 10 MG tablet Take 10 mg by mouth daily.      clonazePAM (KLONOPIN) 0.5 MG tablet Take 1 tablet (0.5 mg total) by mouth 2 (two) times daily as needed for anxiety. 60 tablet 1   diltiazem (CARDIZEM CD) 120 MG 24 hr capsule TAKE 1 CAPSULE BY MOUTH EVERY DAY 90 capsule 2   escitalopram (LEXAPRO) 10 MG tablet TAKE 1 TABLET BY MOUTH EVERY DAY 90 tablet 2   hydrochlorothiazide (MICROZIDE) 12.5 MG capsule TAKE 1 CAPSULE BY MOUTH EVERY DAY 90 capsule 3   Multiple Vitamin (MULTIVITAMIN) tablet Take 1 tablet by mouth daily.     pantoprazole (PROTONIX) 40 MG tablet Take 1 tablet (40 mg total) by mouth daily. 30 tablet 3   Current Facility-Administered Medications on File Prior to Visit  Medication Dose Route Frequency Provider Last Rate Last Admin   gadopentetate dimeglumine (MAGNEVIST) injection 20 mL  20 mL Intravenous Once PRN Sater, Pearletha Furl, MD       Allergies  Allergen Reactions   Gabapentin     Dizziness   Other     American cockroaches   Penicillin G     Other reaction(s): hives   Penicillins Hives and Rash    Did it involve swelling of the face/tongue/throat, SOB, or low BP? No Did it involve sudden or severe rash/hives, skin peeling, or any reaction on the inside of your  mouth or nose? No Did you need to seek medical attention at a hospital or doctor's office? Yes When did it last happen?      Childhood allergy If all above answers are "NO", may proceed with cephalosporin use.    Social History   Socioeconomic History   Marital status: Single    Spouse name: Not on file   Number of children: 0   Years of education: Not on file   Highest education level: Not on file  Occupational History   Occupation: Radiographer, therapeutic  Tobacco Use   Smoking status: Never   Smokeless tobacco: Never  Vaping Use   Vaping Use: Never used  Substance and Sexual Activity   Alcohol use: Yes    Comment: rare   Drug use: No   Sexual activity: Not on file  Other Topics Concern   Not on file  Social History Narrative   Not on file   Social Determinants of Health   Financial Resource Strain: Not on file  Food Insecurity: Not on file  Transportation Needs: Not on file  Physical Activity: Not on file  Stress: Not on file  Social Connections: Not on file  Intimate Partner Violence: Not on file   Family History  Problem Relation Age of Onset   Stroke Father    Diabetes Other    Hyperlipidemia Other    Heart disease Other    Hypertension Other    Cancer Other    Breast cancer Neg Hx       Review of Systems  Neurological:  Positive for dizziness.      Objective:   Physical Exam Constitutional:      General: She is not in acute distress.    Appearance: Normal appearance. She is obese. She is not ill-appearing, toxic-appearing or diaphoretic.  HENT:     Head: Normocephalic and atraumatic.     Right Ear: Tympanic membrane and ear  canal normal.     Left Ear: Tympanic membrane and ear canal normal.     Nose: No congestion or rhinorrhea.     Mouth/Throat:     Mouth: Mucous membranes are moist.     Pharynx: Oropharynx is clear. No oropharyngeal exudate or posterior oropharyngeal erythema.  Eyes:     Extraocular Movements: Extraocular movements  intact.     Conjunctiva/sclera: Conjunctivae normal.     Pupils: Pupils are equal, round, and reactive to light.  Neck:     Vascular: No carotid bruit.  Cardiovascular:     Rate and Rhythm: Normal rate and regular rhythm.     Pulses: Normal pulses.     Heart sounds: Normal heart sounds. No murmur heard.   No friction rub. No gallop.  Pulmonary:     Effort: Pulmonary effort is normal. No respiratory distress.     Breath sounds: Normal breath sounds. No stridor. No wheezing, rhonchi or rales.  Abdominal:     General: Bowel sounds are normal. There is no distension.     Palpations: There is no mass.     Tenderness: There is no abdominal tenderness. There is no right CVA tenderness, left CVA tenderness, guarding or rebound.     Hernia: No hernia is present.  Musculoskeletal:        General: No swelling.     Cervical back: Normal range of motion and neck supple. No rigidity.     Right lower leg: Edema present.     Left lower leg: Edema present.  Lymphadenopathy:     Cervical: No cervical adenopathy.  Skin:    General: Skin is warm.     Coloration: Skin is not jaundiced or pale.     Findings: No bruising, erythema, lesion or rash.  Neurological:     General: No focal deficit present.     Mental Status: She is alert and oriented to person, place, and time. Mental status is at baseline.     Cranial Nerves: No cranial nerve deficit.     Sensory: No sensory deficit.     Motor: No weakness.     Coordination: Coordination normal.  Psychiatric:        Mood and Affect: Mood normal.        Behavior: Behavior normal.        Thought Content: Thought content normal.        Judgment: Judgment normal.     Nausea and vomiting, unspecified vomiting type  Near syncope  GAD (generalized anxiety disorder) I believe that her near syncope and nausea is likely due to anxiety.  I recommended discontinuation of Lexapro and replacement of the Lexapro with Effexor extended release 75 mg p.o. every  morning.  She can use Klonopin as needed.  Reassess in 4 to 6 weeks and uptitrate Effexor if necessary.

## 2021-09-26 NOTE — Telephone Encounter (Signed)
error 

## 2021-10-09 ENCOUNTER — Other Ambulatory Visit: Payer: Self-pay | Admitting: Family Medicine

## 2021-10-10 NOTE — Telephone Encounter (Signed)
Rx 09/15/21 #30 3 RF Requested Prescriptions  Pending Prescriptions Disp Refills  . pantoprazole (PROTONIX) 40 MG tablet [Pharmacy Med Name: PANTOPRAZOLE SOD DR 40 MG TAB] 90 tablet 1    Sig: TAKE 1 TABLET BY MOUTH EVERY DAY     Gastroenterology: Proton Pump Inhibitors Passed - 10/10/2021 10:11 AM      Passed - Valid encounter within last 12 months    Recent Outpatient Visits          2 weeks ago Nausea and vomiting, unspecified vomiting type   [facility] Donita Brooks, MD   3 weeks ago Nausea and vomiting, unspecified vomiting type   [facility] Tanya Nones, Priscille Heidelberg, MD   3 months ago Near syncope   [facility] Pickard, Priscille Heidelberg, MD   10 months ago General medical exam   [facility] Donita Brooks, MD   1 year ago Pure hypercholesterolemia   [facility] Pickard, Priscille Heidelberg, MD

## 2021-10-14 ENCOUNTER — Other Ambulatory Visit: Payer: Self-pay | Admitting: Family Medicine

## 2021-10-16 NOTE — Telephone Encounter (Signed)
  Notes to clinic:  Pharm requests as follows:  Pharmacy comment: REQUEST FOR 90 DAYS PRESCRIPTION.     Requested Prescriptions  Pending Prescriptions Disp Refills   venlafaxine XR (EFFEXOR-XR) 75 MG 24 hr capsule [Pharmacy Med Name: VENLAFAXINE HCL ER 75 MG CAP] 90 capsule 1    Sig: Take 1 capsule (75 mg total) by mouth daily with breakfast. Stop escitalopram     Psychiatry: Antidepressants - SNRI - desvenlafaxine & venlafaxine Failed - 10/16/2021 11:12 AM      Failed - Last BP in normal range    BP Readings from Last 1 Encounters:  09/22/21 140/80         Failed - Lipid Panel in normal range within the last 12 months    Cholesterol  Date Value Ref Range Status  11/22/2020 134 <200 mg/dL Final   LDL Cholesterol (Calc)  Date Value Ref Range Status  11/22/2020 64 mg/dL (calc) Final    Comment:    Reference range: <100 . Desirable range <100 mg/dL for primary prevention;   <70 mg/dL for patients with CHD or diabetic patients  with > or = 2 CHD risk factors. Marland Kitchen LDL-C is now calculated using the Martin-Hopkins  calculation, which is a validated novel method providing  better accuracy than the Friedewald equation in the  estimation of LDL-C.  Horald Pollen et al. Lenox Ahr. 0254;270(62): 2061-2068  (http://education.[facility].com/faq/FAQ164)    HDL  Date Value Ref Range Status  11/22/2020 52 > OR = 50 mg/dL Final   Triglycerides  Date Value Ref Range Status  11/22/2020 99 <150 mg/dL Final         Passed - Cr in normal range and within 360 days    Creat  Date Value Ref Range Status  09/15/2021 0.78 0.50 - 1.05 mg/dL Final         Passed - Valid encounter within last 6 months    Recent Outpatient Visits           3 weeks ago Nausea and vomiting, unspecified vomiting type   [facility] Donita Brooks, MD   1 month ago Nausea and vomiting, unspecified vomiting type   [facility] Tanya Nones, Priscille Heidelberg, MD   3 months ago Near syncope    [facility] Pickard, Priscille Heidelberg, MD   10 months ago General medical exam   [facility] Donita Brooks, MD   1 year ago Pure hypercholesterolemia   [facility] Pickard, Priscille Heidelberg, MD

## 2021-10-17 NOTE — Telephone Encounter (Signed)
LOV 09/22/21 Last refill 09/15/21, #60, 1 refills  Please review, thanks!

## 2021-10-21 IMAGING — MG MM DIGITAL SCREENING BILAT W/ TOMO AND CAD
8 series · 8 of 24 positions shown · non-contrast
Comparison: Previous exam(s).

CLINICAL DATA: Screening.

EXAM:
DIGITAL SCREENING BILATERAL MAMMOGRAM WITH TOMOSYNTHESIS AND CAD
TECHNIQUE: Bilateral screening digital craniocaudal and mediolateral oblique
mammograms were obtained. Bilateral screening digital breast
tomosynthesis was performed. The images were evaluated with
computer-aided detection.

[R MLO synth-2D]
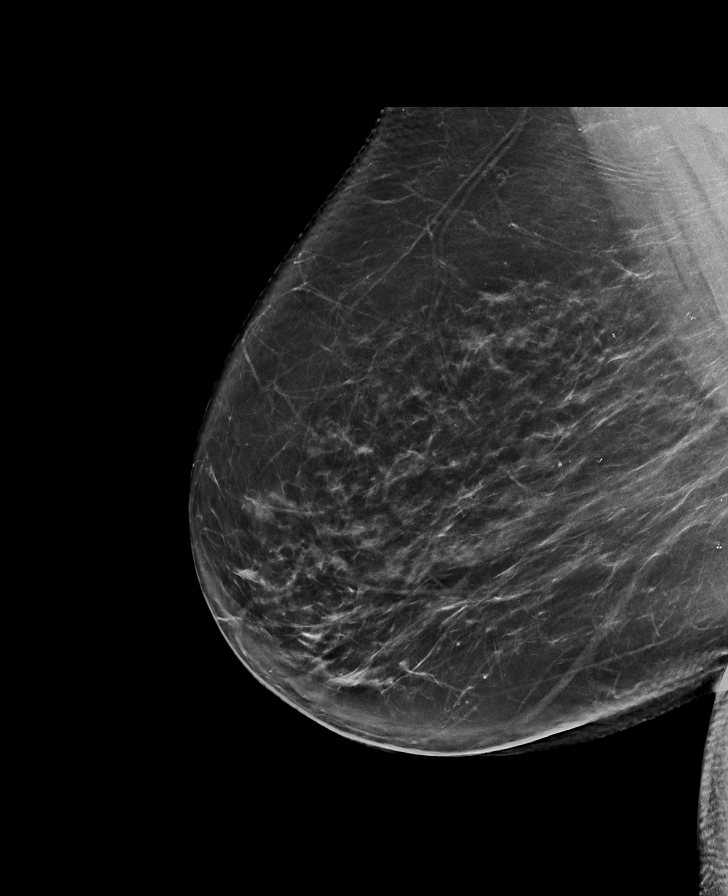

[L CC synth-2D]
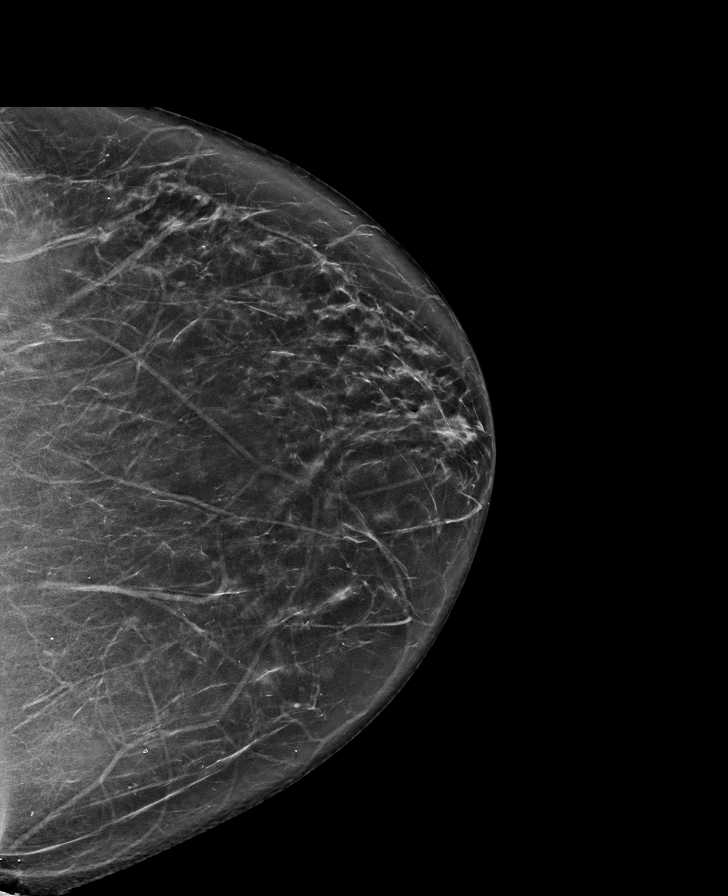

[L MLO synth-2D]
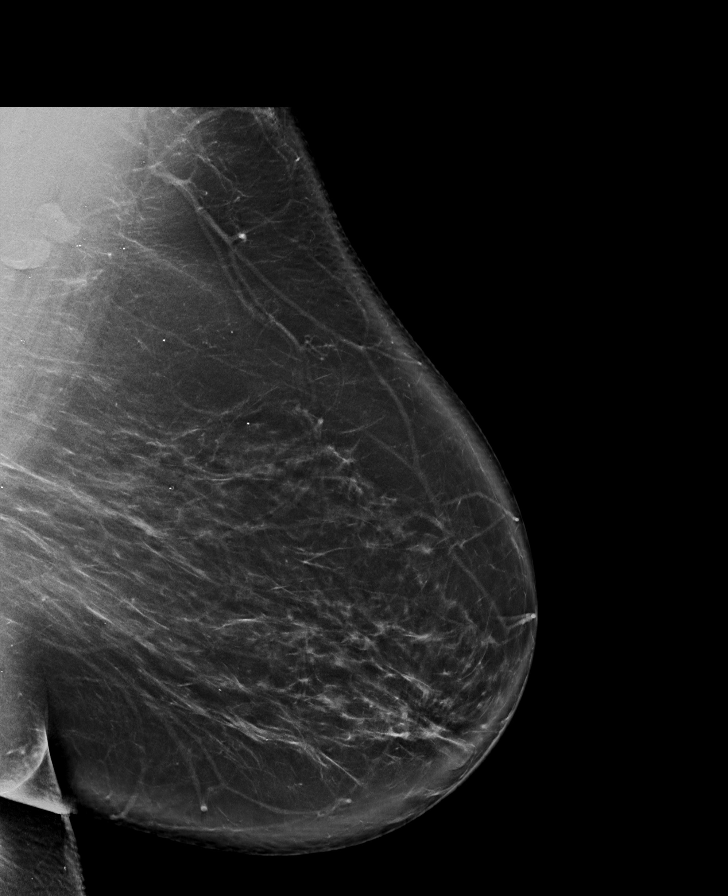

[R CC synth-2D]
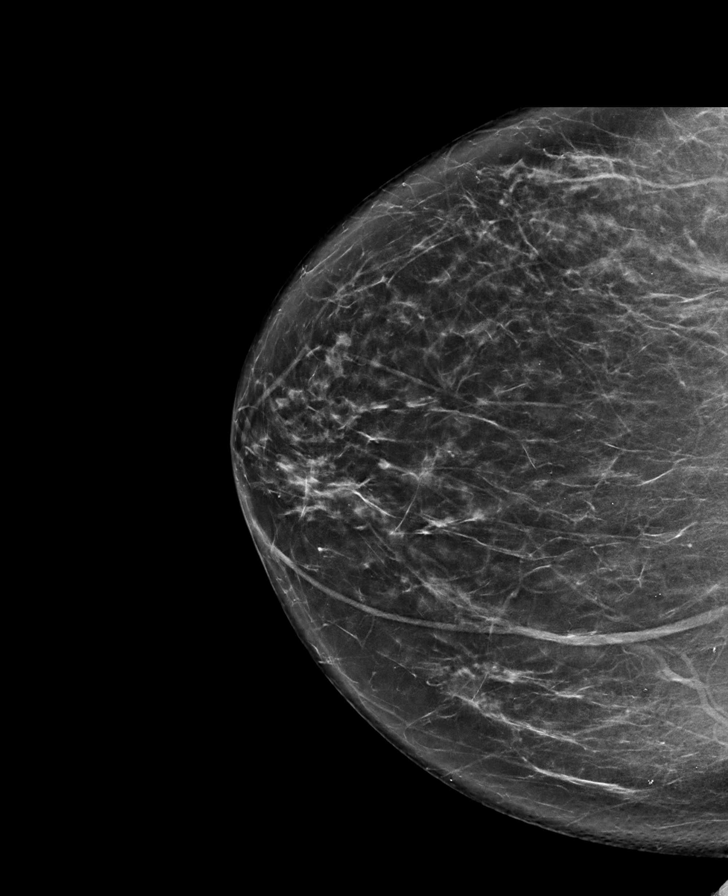

[R MLO tomo · tomo slice 48/95.0]
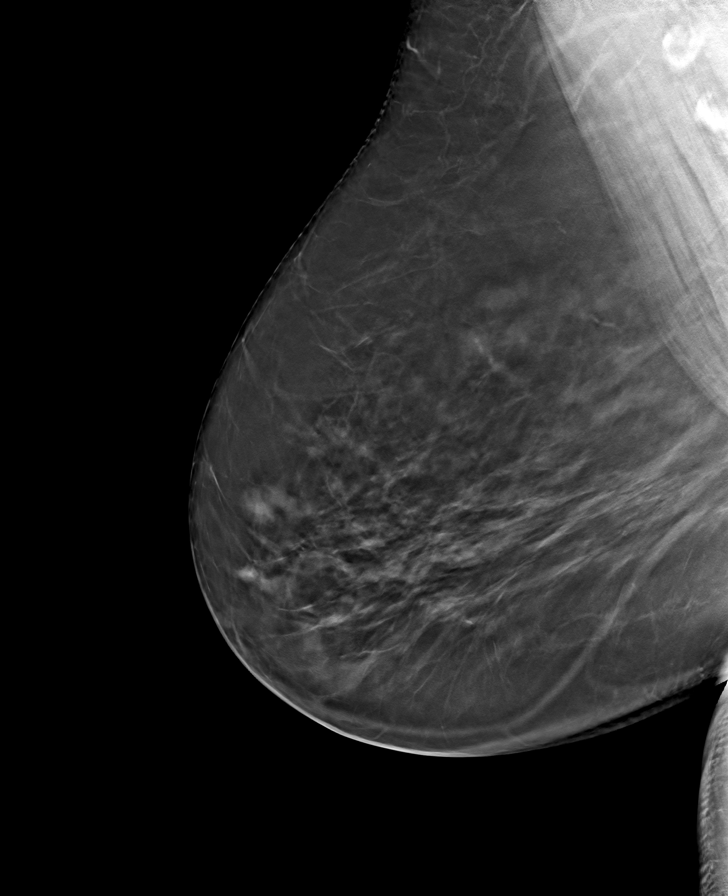

[R CC tomo · tomo slice 38/75.0]
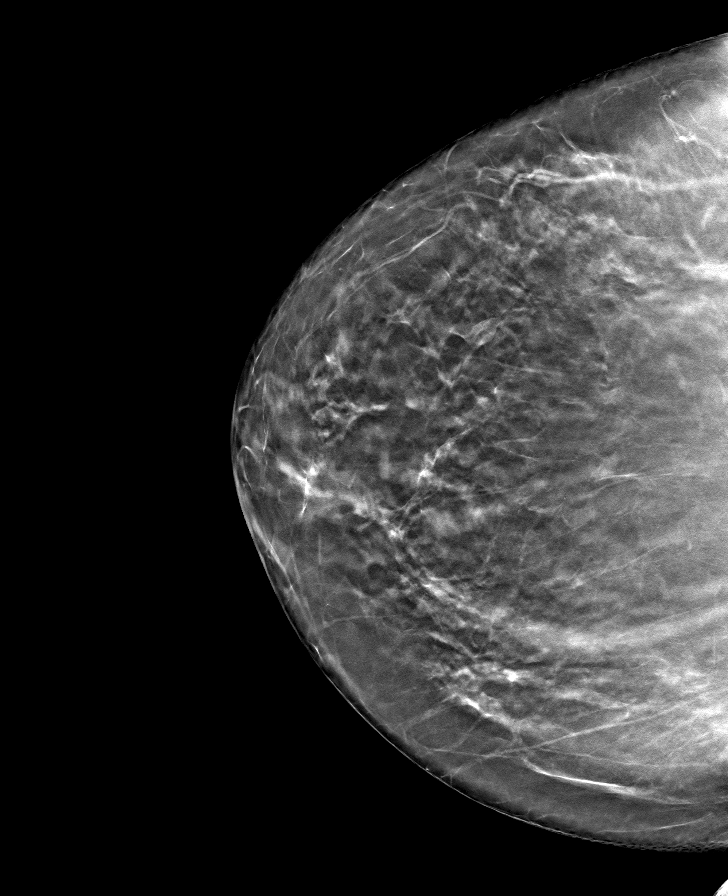

[L CC tomo · tomo slice 39/76.0]
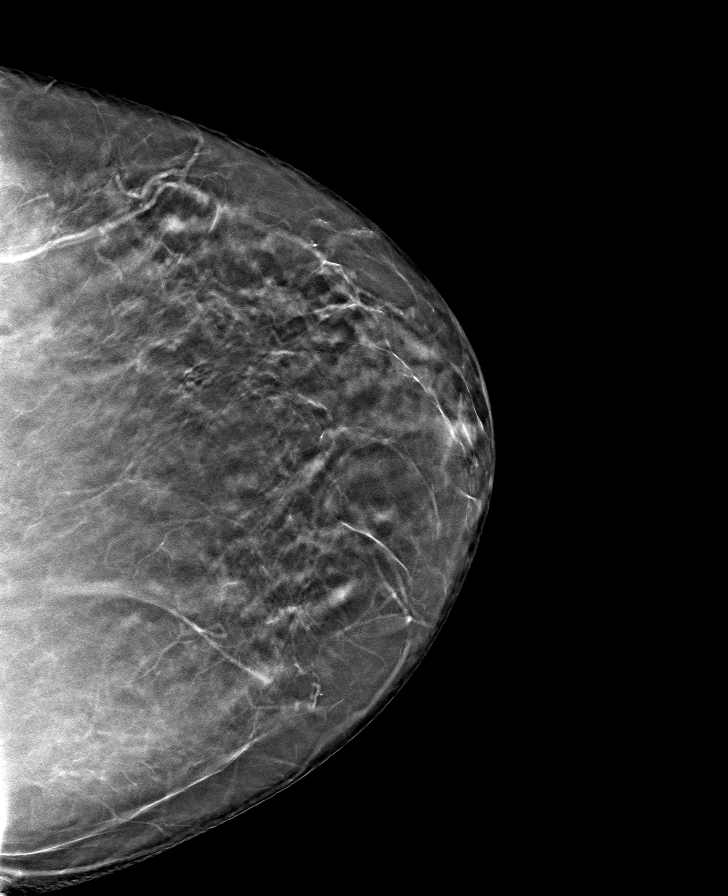

[L MLO tomo · tomo slice 51/101.0]
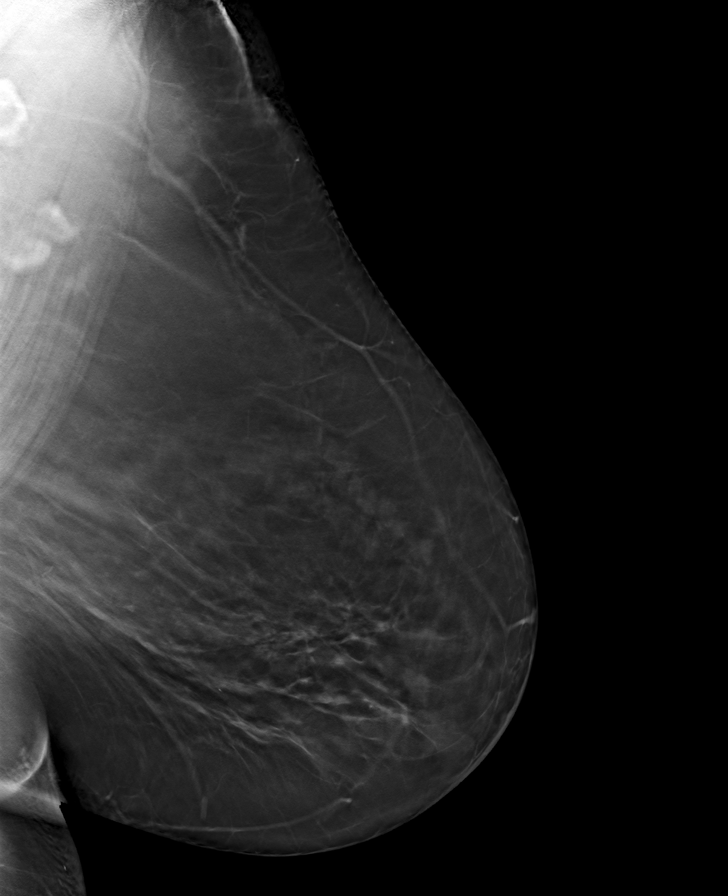

[8 of 24 positions shown; findings below may reference images not displayed]

ACR Breast Density Category c: The breast tissue is heterogeneously
dense, which may obscure small masses.
FINDINGS: There are no findings suspicious for malignancy.
IMPRESSION: No mammographic evidence of malignancy. A result letter of this
screening mammogram will be mailed directly to the patient.

RECOMMENDATION:
Screening mammogram in one year. (Code:Q3-W-BC3)

BI-RADS CATEGORY  1: Negative.

## 2021-12-07 ENCOUNTER — Telehealth: Payer: Self-pay

## 2021-12-07 NOTE — Telephone Encounter (Signed)
Pt called in just wanting to check to make sure her payment of $30 had been posted to her bill. Pt does have an outstanding balance. Pt states that she did make a payment towards this balance but is still receiving a bill.  Cb#: [phone]

## 2021-12-24 ENCOUNTER — Other Ambulatory Visit: Payer: Self-pay

## 2021-12-24 ENCOUNTER — Encounter (HOSPITAL_COMMUNITY): Payer: Self-pay | Admitting: Emergency Medicine

## 2021-12-24 ENCOUNTER — Ambulatory Visit (HOSPITAL_COMMUNITY)
Admission: EM | Admit: 2021-12-24 | Discharge: 2021-12-24 | Disposition: A | Discharge status: Home / Self Care | Payer: Medicare Other | Attending: Emergency Medicine | Admitting: Emergency Medicine

## 2021-12-24 DIAGNOSIS — R21 Rash and other nonspecific skin eruption: Secondary | ICD-10-CM | POA: Diagnosis not present

## 2021-12-24 DIAGNOSIS — W57XXXA Bitten or stung by nonvenomous insect and other nonvenomous arthropods, initial encounter: Secondary | ICD-10-CM

## 2021-12-24 MED ORDER — TRIAMCINOLONE ACETONIDE 0.1 % EX CREA: 1.0000 | TOPICAL_CREAM | Freq: Two times a day (BID) | CUTANEOUS | 0 refills | Status: DC

## 2021-12-24 NOTE — ED Triage Notes (Signed)
On week ago woke with rash to lower legs.  Patient reports history of urticaria.  Current rash legs looks different than "normal " rash.  Legs are itching-cold compresses, benadryl.  Small red bumps, some with scabs are present from knee down, bilaterally.  Bumps are of different sizes and no particular pattern.

## 2021-12-24 NOTE — ED Provider Notes (Signed)
[facility]    CSN: [id number] Arrival date & time: 12/24/21  1120      History   Chief Complaint Chief Complaint  Patient presents with   Leg Pain    HPI [name] [name] is a 61 y.o. female.  Presents with rash. Began 1 week ago after being outside at a retirement party Reports itchy spots only on the lower legs She has been scratching at them a lot Has tried Benadryl cream and cool compress She takes Zyrtec twice daily for history of chronic hives  No shortness of breath, trouble breathing, swelling of lips/tongue, fever, numbness/tingling.  Past Medical History:  Diagnosis Date   Anxiety 2008   seen in ED /w chest pain but told that she was having anxiety   Arthritis    History of kidney stones 1999   History of migraine    History of stress test 2010   done due to chest pain /w Dr. Jacinto Halim. Pt. told that it was wnl.   Hives of unknown origin    told by PCP Terri Piedra) to use Zrytec BID to control hives    Hypertension    Neuromuscular disorder (HCC)    back pain   Sleep difficulties    has had a sleep study - t old that she didn't meet criteria for machine    Syncope    due to pain    Patient Active Problem List   Diagnosis Date Noted   Pain in left knee 09/22/2019   Osteoarthritis of left knee 09/17/2019   Degenerative arthritis of left knee 09/17/2019   Osteoarthritis of right knee 12/24/2018   Abnormal brain MRI 07/02/2017   Pain in right knee 05/03/2017   Insomnia 02/26/2017   Vestibular disorder 01/24/2017   Headache disorder 01/24/2017   Vitamin D deficiency 08/02/2015   HTN (hypertension) 05/25/2015   Syncope 02/17/2015   Back pain 10/28/2013   Obstructive sleep apnea 09/18/2013   Morbid obesity-BMI 52 07/10/2013    Past Surgical History:  Procedure Laterality Date   COLONOSCOPY  2016   DECOMPRESSIVE LUMBAR LAMINECTOMY LEVEL 1 N/A 10/28/2013   Procedure: DECOMPRESSION L4-5;  Surgeon: Venita Lick, MD;  Location: [facility];  Service:  Orthopedics;  Laterality: N/A;   KNEE ARTHROPLASTY Right 12/24/2018   Procedure: COMPUTER ASSISTED TOTAL KNEE ARTHROPLASTY;  Surgeon: Samson Frederic, MD;  Location: [facility];  Service: Orthopedics;  Laterality: Right;   KNEE ARTHROPLASTY Left 09/17/2019   Procedure: COMPUTER ASSISTED TOTAL KNEE ARTHROPLASTY;  Surgeon: Samson Frederic, MD;  Location: [facility];  Service: Orthopedics;  Laterality: Left;   KNEE ARTHROSCOPY  12/2008   left knee   KNEE ARTHROSCOPY Right 01/27/2018   PARTIAL HYSTERECTOMY  10/1994   thumb surgery Right 03/1975    OB History   No obstetric history on file.      Home Medications    Prior to Admission medications   Medication Sig Start Date End Date Taking? Authorizing Provider  triamcinolone cream (KENALOG) 0.1 % Apply 1 Application topically 2 (two) times daily. 12/24/21  Yes Sallyanne Birkhead, Lurena Joiner, PA-C  ALPRAZolam (XANAX) 0.5 MG tablet Take 1 tablet (0.5 mg total) by mouth 2 (two) times daily as needed for anxiety or sleep. Patient not taking: Reported on 12/24/2021 11/24/20   Donita Brooks, MD  atorvastatin (LIPITOR) 20 MG tablet TAKE 1 TABLET BY MOUTH EVERY DAY 07/13/21   Donita Brooks, MD  celecoxib (CELEBREX) 200 MG capsule Take 200 mg by mouth daily. Patient not  taking: Reported on 09/22/2021 06/18/21   [provider]  cetirizine (ZYRTEC) 10 MG tablet Take 10 mg by mouth daily.     [provider]  clonazePAM (KLONOPIN) 0.5 MG tablet Take 1 tablet (0.5 mg total) by mouth 2 (two) times daily as needed for anxiety. Patient not taking: Reported on 12/24/2021 09/15/21   Donita Brooks, MD  diltiazem (CARDIZEM CD) 120 MG 24 hr capsule TAKE 1 CAPSULE BY MOUTH EVERY DAY 06/23/21   Donita Brooks, MD  escitalopram (LEXAPRO) 10 MG tablet TAKE 1 TABLET BY MOUTH EVERY DAY Patient not taking: Reported on 12/24/2021 12/19/20   Donita Brooks, MD  hydrochlorothiazide (MICROZIDE) 12.5 MG capsule TAKE 1 CAPSULE BY MOUTH EVERY DAY 06/23/21   Donita Brooks, MD  Multiple Vitamin (MULTIVITAMIN) tablet Take 1 tablet by mouth daily.    [provider]  pantoprazole (PROTONIX) 40 MG tablet Take 1 tablet (40 mg total) by mouth daily. Patient not taking: Reported on 12/24/2021 09/15/21   Donita Brooks, MD  venlafaxine XR (EFFEXOR-XR) 75 MG 24 hr capsule TAKE 1 CAPSULE (75 MG TOTAL) BY MOUTH DAILY WITH BREAKFAST. STOP ESCITALOPRAM Patient not taking: Reported on 12/24/2021 10/17/21   Donita Brooks, MD    Family History Family History  Problem Relation Age of Onset   Stroke Father    Diabetes Other    Hyperlipidemia Other    Heart disease Other    Hypertension Other    Cancer Other    Breast cancer Neg Hx     Social History Social History   Tobacco Use   Smoking status: Never   Smokeless tobacco: Never  Vaping Use   Vaping Use: Never used  Substance Use Topics   Alcohol use: Yes    Comment: rare   Drug use: No     Allergies   Gabapentin, Other, Penicillin g, and Penicillins   Review of Systems Review of Systems Per HPI  Physical Exam Triage Vital Signs ED Triage Vitals  Enc Vitals Group     BP 12/24/21 1247 (!) 154/80     Pulse Rate 12/24/21 1247 79     Resp 12/24/21 1247 18     Temp 12/24/21 1247 97.9 F (36.6 C)     Temp Source 12/24/21 1247 Oral     SpO2 12/24/21 1247 96 %     Weight --      Height --      Head Circumference --      Peak Flow --      Pain Score 12/24/21 1244 0     Pain Loc --      Pain Edu? --      Excl. in GC? --    No data found.  Updated Vital Signs BP (!) 154/80 (BP Location: Right Arm) Comment (BP Location): large cuff  Pulse 79   Temp 97.9 F (36.6 C) (Oral)   Resp 18   SpO2 96%    Physical Exam Vitals and nursing note reviewed.  Constitutional:      General: She is not in acute distress.    Appearance: Normal appearance.  HENT:     Mouth/Throat:     Mouth: Mucous membranes are moist. No angioedema.     Pharynx: Oropharynx is clear. Uvula midline. No  pharyngeal swelling or posterior oropharyngeal erythema.  Eyes:     Conjunctiva/sclera: Conjunctivae normal.  Cardiovascular:     Rate and Rhythm: Normal rate and regular rhythm.  Pulses: Normal pulses.     Heart sounds: Normal heart sounds.  Pulmonary:     Effort: Pulmonary effort is normal. No respiratory distress.     Breath sounds: Normal breath sounds. No wheezing.  Musculoskeletal:        General: No swelling or tenderness. Normal range of motion.     Cervical back: Normal range of motion.  Lymphadenopathy:     Cervical: No cervical adenopathy.  Skin:    Findings: Rash present. No erythema. Rash is not urticarial.     Comments: Multiple, scattered, excoriated bites to the bilateral lower legs   Neurological:     Mental Status: She is alert and oriented to person, place, and time.     UC Treatments / Results  Labs (all labs ordered are listed, but only abnormal results are displayed) Labs Reviewed - No data to display  EKG  Radiology No results found.  Procedures Procedures   Medications Ordered in UC Medications - No data to display  Initial Impression / Assessment and Plan / UC Course  I have reviewed the triage vital signs and the nursing notes.  Pertinent labs & imaging results that were available during my care of the patient were reviewed by me and considered in my medical decision making (see chart for details).  Rash appears to be ant bites versus insect bites.  Very excoriated. No signs of surrounding cellulitis/underlying bacterial infection  We will try moderate potency steroid cream topically twice daily.  Continue with the daily Zyrtec Recommend follow-up with primary care if symptoms persist Patient agrees with plan  Final Clinical Impressions(s) / UC Diagnoses   Final diagnoses:  Rash  Multiple insect bites     Discharge Instructions      Apply the steroid cream twice daily. This should help with itching and rash.  Please follow up  with your primary care provider if symptoms persist.     ED Prescriptions     Medication Sig Dispense Auth. Provider   triamcinolone cream (KENALOG) 0.1 % Apply 1 Application topically 2 (two) times daily. 30 g Ettore Trebilcock, Wells Guiles, PA-C      PDMP not reviewed this encounter.   Juanetta Negash, Wells Guiles, [place] 12/24/21 1407

## 2021-12-24 NOTE — Discharge Instructions (Addendum)
Apply the steroid cream twice daily. This should help with itching and rash.  Please follow up with your primary care provider if symptoms persist.

## 2022-01-03 ENCOUNTER — Other Ambulatory Visit: Payer: Self-pay | Admitting: Family Medicine

## 2022-01-03 DIAGNOSIS — Z1231 Encounter for screening mammogram for malignant neoplasm of breast: Secondary | ICD-10-CM

## 2022-01-12 ENCOUNTER — Other Ambulatory Visit: Payer: Self-pay | Admitting: Family Medicine

## 2022-01-17 ENCOUNTER — Ambulatory Visit
Admission: RE | Admit: 2022-01-17 | Discharge: 2022-01-17 | Disposition: A | Discharge status: Home / Self Care | Payer: Medicare Other | Source: Ambulatory Visit | Attending: Family Medicine | Admitting: Family Medicine

## 2022-01-17 DIAGNOSIS — Z1231 Encounter for screening mammogram for malignant neoplasm of breast: Secondary | ICD-10-CM

## 2022-01-31 ENCOUNTER — Telehealth: Payer: Self-pay

## 2022-01-31 NOTE — Telephone Encounter (Signed)
Pt called requesting call back to discuss GI referral. Tried to call pt, number rang several times then message came on saying number is out of service.

## 2022-02-06 ENCOUNTER — Other Ambulatory Visit: Payer: Self-pay

## 2022-02-06 ENCOUNTER — Other Ambulatory Visit: Payer: Self-pay | Admitting: Family Medicine

## 2022-02-06 DIAGNOSIS — R112 Nausea with vomiting, unspecified: Secondary | ICD-10-CM

## 2022-02-06 DIAGNOSIS — K219 Gastro-esophageal reflux disease without esophagitis: Secondary | ICD-10-CM

## 2022-02-06 DIAGNOSIS — F411 Generalized anxiety disorder: Secondary | ICD-10-CM

## 2022-02-06 MED ORDER — FAMOTIDINE 40 MG PO TABS: 40.0000 mg | ORAL_TABLET | Freq: Every day | ORAL | 3 refills | Status: DC

## 2022-02-07 ENCOUNTER — Encounter: Payer: Self-pay | Admitting: Internal Medicine

## 2022-02-14 ENCOUNTER — Encounter: Payer: Self-pay | Admitting: Family Medicine

## 2022-02-15 ENCOUNTER — Telehealth: Payer: Self-pay

## 2022-02-15 ENCOUNTER — Other Ambulatory Visit: Payer: Self-pay | Admitting: Family Medicine

## 2022-02-15 NOTE — Telephone Encounter (Signed)
Pt called and stated that she is unable to afford Famotidine 40 mg to help with her GI issues. Can a different Rx be sent in? Thank you.

## 2022-02-28 NOTE — Telephone Encounter (Signed)
I have reviewed patients account and see the payment of $30.

## 2022-03-19 ENCOUNTER — Encounter: Payer: Self-pay | Admitting: Internal Medicine

## 2022-03-19 ENCOUNTER — Other Ambulatory Visit (INDEPENDENT_AMBULATORY_CARE_PROVIDER_SITE_OTHER): Payer: 59

## 2022-03-19 ENCOUNTER — Ambulatory Visit (INDEPENDENT_AMBULATORY_CARE_PROVIDER_SITE_OTHER): Payer: 59 | Admitting: Internal Medicine

## 2022-03-19 VITALS — BP 150/90 | HR 75 | Ht 61.0 in | Wt 251.0 lb

## 2022-03-19 DIAGNOSIS — R1013 Epigastric pain: Secondary | ICD-10-CM

## 2022-03-19 DIAGNOSIS — R1310 Dysphagia, unspecified: Secondary | ICD-10-CM

## 2022-03-19 DIAGNOSIS — Z86010 Personal history of colonic polyps: Secondary | ICD-10-CM

## 2022-03-19 DIAGNOSIS — R197 Diarrhea, unspecified: Secondary | ICD-10-CM

## 2022-03-19 DIAGNOSIS — R112 Nausea with vomiting, unspecified: Secondary | ICD-10-CM

## 2022-03-19 NOTE — Progress Notes (Signed)
Chief Complaint: N&V  HPI : 61 year old female with history of anxiety, migraines, obesity, colon polyps, and arthritis presents with N&V  She developed N&V, ab pain, and diarrhea starting in 08/2021. She kept feeling really sick. Her PCP thought that it may be related to anxiety and depression. Some days she feels good and other days she cannot stay out of the bathroom. It doesn't matter what she eats. She will eat a bland diet and it can still cause her symptoms. Sometimes she eats a large meal and she has no symptoms. If she has normal bowel habits, she would have one BM per day. Now she can go to the bathroom to have a BM up to 5 times per day. Endorses some nocturnal stools. She feels ab discomfort in her epigastric ab pain. Denies chest burning. Has occasional regurgitation. Bending over or laying back seems to worsen her regurgitation. She has been on pantoprazole 40 mg QD without any response thus far. Denies hematemesis or blood in stools. Endorses dysphagia that gets stuck in the lower chest almost all the time. Dysphagia is to solids and liquids. Endorses early satiety. Denies prior EGD. Last colonoscopy was in 2021 that was normal. Endorses family history of colon polyps. She is on Aleve once a day for a back pain. Denies other NSAIDs. Denies alcohol use.    Past Medical History:  Diagnosis Date   Anxiety 2008   seen in ED /w chest pain but told that she was having anxiety   Arthritis    Colon polyps    History of kidney stones 1999   History of migraine    History of stress test 2010   done due to chest pain /w Dr. Jacinto Halim. Pt. told that it was wnl.   Hives of unknown origin    told by PCP Terri Piedra) to use Zrytec BID to control hives    Hypertension    Kidney stones    Neuromuscular disorder (HCC)    back pain   Obesity, unspecified    Sleep difficulties    has had a sleep study - t old that she didn't meet criteria for machine    Syncope    due to pain   Past Surgical  History:  Procedure Laterality Date   COLONOSCOPY  2016   DECOMPRESSIVE LUMBAR LAMINECTOMY LEVEL 1 N/A 10/28/2013   Procedure: DECOMPRESSION L4-5;  Surgeon: Venita Lick, MD;  Location: [facility];  Service: Orthopedics;  Laterality: N/A;   KNEE ARTHROPLASTY Right 12/24/2018   Procedure: COMPUTER ASSISTED TOTAL KNEE ARTHROPLASTY;  Surgeon: Samson Frederic, MD;  Location: [facility];  Service: Orthopedics;  Laterality: Right;   KNEE ARTHROPLASTY Left 09/17/2019   Procedure: COMPUTER ASSISTED TOTAL KNEE ARTHROPLASTY;  Surgeon: Samson Frederic, MD;  Location: [facility];  Service: Orthopedics;  Laterality: Left;   KNEE ARTHROSCOPY  12/2008   left knee   KNEE ARTHROSCOPY Right 01/27/2018   PARTIAL HYSTERECTOMY  10/1994   thumb surgery Right 03/1975   Family History  Problem Relation Age of Onset   Stroke Father    Diabetes Other    Hyperlipidemia Other    Heart disease Other    Hypertension Other    Cancer Other    Breast cancer Neg Hx    Colon cancer Neg Hx    Esophageal cancer Neg Hx    Social History   Tobacco Use   Smoking status: Never   Smokeless tobacco: Never  Vaping Use   Vaping Use: Never  used  Substance Use Topics   Alcohol use: Yes    Comment: rare   Drug use: No   Current Outpatient Medications  Medication Sig Dispense Refill   atorvastatin (LIPITOR) 20 MG tablet TAKE 1 TABLET BY MOUTH EVERY DAY 90 tablet 3   cetirizine (ZYRTEC) 10 MG tablet Take 10 mg by mouth daily.      cetirizine (ZYRTEC) 10 MG tablet Take 10 mg by mouth daily.     clonazePAM (KLONOPIN) 0.5 MG tablet Take 1 tablet (0.5 mg total) by mouth 2 (two) times daily as needed for anxiety. 60 tablet 1   diltiazem (CARDIZEM CD) 120 MG 24 hr capsule TAKE 1 CAPSULE BY MOUTH EVERY DAY 90 capsule 2   escitalopram (LEXAPRO) 10 MG tablet TAKE 1 TABLET BY MOUTH EVERY DAY 90 tablet 2   famotidine (PEPCID) 40 MG tablet Take 1 tablet (40 mg total) by mouth daily. 90 tablet 3   hydrochlorothiazide (MICROZIDE) 12.5 MG capsule  TAKE 1 CAPSULE BY MOUTH EVERY DAY 90 capsule 3   Multiple Vitamin (MULTIVITAMIN) tablet Take 1 tablet by mouth daily.     Naproxen Sodium (ALEVE PO) Take by mouth.     NON FORMULARY Diltiazem 120 mg     pantoprazole (PROTONIX) 40 MG tablet TAKE 1 TABLET BY MOUTH EVERY DAY 90 tablet 1   triamcinolone cream (KENALOG) 0.1 % Apply 1 Application topically 2 (two) times daily. 30 g 0   ALPRAZolam (XANAX) 0.5 MG tablet Take 1 tablet (0.5 mg total) by mouth 2 (two) times daily as needed for anxiety or sleep. (Patient not taking: Reported on 03/19/2022) 30 tablet 0   celecoxib (CELEBREX) 200 MG capsule Take 200 mg by mouth daily. (Patient not taking: Reported on 03/19/2022)     venlafaxine XR (EFFEXOR-XR) 75 MG 24 hr capsule TAKE 1 CAPSULE (75 MG TOTAL) BY MOUTH DAILY WITH BREAKFAST. STOP ESCITALOPRAM (Patient not taking: Reported on 03/19/2022) 90 capsule 1   No current facility-administered medications for this visit.   Facility-Administered Medications Ordered in Other Visits  Medication Dose Route Frequency Provider Last Rate Last Admin   gadopentetate dimeglumine (MAGNEVIST) injection 20 mL  20 mL Intravenous Once PRN Sater, Pearletha Furl, MD       Allergies  Allergen Reactions   Gabapentin     Dizziness   Other     American cockroaches   Penicillin G     Other reaction(s): hives   Penicillins Hives and Rash    Did it involve swelling of the face/tongue/throat, SOB, or low BP? No Did it involve sudden or severe rash/hives, skin peeling, or any reaction on the inside of your mouth or nose? No Did you need to seek medical attention at a hospital or doctor's office? Yes When did it last happen?      Childhood allergy If all above answers are "NO", may proceed with cephalosporin use.      Review of Systems: All systems reviewed and negative except where noted in HPI.   Physical Exam: BP (!) 150/90 (BP Location: Left Arm, Patient Position: Sitting, Cuff Size: Large)   Pulse 75   Ht 5\' 1"   (1.549 m)   Wt 251 lb (113.9 kg)   SpO2 97%   BMI 47.43 kg/m  Constitutional: Pleasant,well-developed, female in no acute distress. HEENT: Normocephalic and atraumatic. Conjunctivae are normal. No scleral icterus. Cardiovascular: Normal rate, regular rhythm.  Pulmonary/chest: Effort normal and breath sounds normal. No wheezing, rales or rhonchi. Abdominal: Soft, nondistended, tender in  the epigastric ab pain. Bowel sounds active throughout. There are no masses palpable. No hepatomegaly. Extremities: No edema Neurological: Alert and oriented to person place and time. Skin: Skin is warm and dry. No rashes noted. Psychiatric: Normal mood and affect. Behavior is normal.  Labs 08/2021: CBC, CMP, and lipase unremarkable.  Ab U/S 08/05/13: IMPRESSION:  1. There is no acute hepatobiliary abnormality. Evaluation of the  pancreas is limited.  2. There is likely a cyst measuring up to 3.7 cm in the mid upper  pole of the left kidney.   Colonoscopy 10/20/14: Adequate prep. 6 mm sessile polyp in the ascending colon resected with cold snare.  Path: Sessile serrated adenoma  Colonoscopy 12/28/19: Adequate prep. Normal colon. Repeat colonoscopy recommended in 5 years for surveillance.  ASSESSMENT AND PLAN: N&V Dysphagia Epigastric ab pain Diarrhea History of colon polyps Patient presents with N&V, diarrhea, and epigastric pain that have been present for the last 6 months. Unclear etiology at this time. No clear food triggers identified. Prior lab work up showed nml LFTs and lipase. Patient also described some dysphagia along with some difficulty burping, which could suggest that she has an esophageal stricture possibly due to uncontrolled reflux. However, she has not responded to PPI QD thus far. Will start off with labs, RUQ U/S, and EGD for further work up. Would want to rule out gallstones and PUD (patient is on celecoxib and daily naproxen). She is up-to-date with her colon polyp surveillance this  time. - GERD handout - Check CRP, TTG IgA, IgA - RUQ U/S - EGD [facility] - Next colonoscopy is due in 11/2024 for polyp surveillance  Eulah Pont, MD  I spent 60 minutes of time, including in depth chart review, independent review of results as outlined above, communicating results with the patient directly, face-to-face time with the patient, coordinating care, ordering studies and medications as appropriate, and documentation.

## 2022-03-19 NOTE — Patient Instructions (Addendum)
_______________________________________________________  If you are age 62 or older, your body mass index should be between 23-30. Your Body mass index is 47.43 kg/m. If this is out of the aforementioned range listed, please consider follow up with your Primary Care Provider.  If you are age 37 or younger, your body mass index should be between 19-25. Your Body mass index is 47.43 kg/m. If this is out of the aformentioned range listed, please consider follow up with your Primary Care Provider.   ________________________________________________________  The [facility] providers would like to encourage you to use [facility] to communicate with providers for non-urgent requests or questions.  Due to long hold times on the telephone, sending your provider a message by [facility] may be a faster and more efficient way to get a response.  Please allow 48 business hours for a response.  Please remember that this is for non-urgent requests.  _______________________________________________________   Bonita Quin have been scheduled for an endoscopy. Please follow written instructions given to you at your visit today. If you use inhalers (even only as needed), please bring them with you on the day of your procedure.   Your provider has requested that you go to the basement level for lab work before leaving today. Press "B" on the elevator. The lab is located at the first door on the left as you exit the elevator.  You have been scheduled for an abdominal ultrasound at [facility] Radiology (1st floor of hospital) on 03/27/22 at 10:30 am. Please arrive 30 minutes prior to your appointment for registration. Make certain not to have anything to eat or drink  after midnight prior to your appointment. Should you need to reschedule your appointment, please contact radiology at [phone]. This test typically takes about 30 minutes to perform.   Due to recent changes in healthcare laws, you may see the results of your imaging  and laboratory studies on MyChart before your provider has had a chance to review them.  We understand that in some cases there may be results that are confusing or concerning to you. Not all laboratory results come back in the same time frame and the provider may be waiting for multiple results in order to interpret others.  Please give [place] 48 hours in order for your provider to thoroughly review all the results before contacting the office for clarification of your results.     Thank you for entrusting me with your care and choosing [facility].  Dr Leonides Schanz

## 2022-03-20 LAB — C-REACTIVE PROTEIN: CRP: 1.5 mg/dL (ref 0.5–20.0)

## 2022-03-21 LAB — IGA: Immunoglobulin A: 275 mg/dL (ref 70–320)

## 2022-03-21 LAB — TISSUE TRANSGLUTAMINASE, IGA: (tTG) Ab, IgA: <1 U/mL

## 2022-03-27 ENCOUNTER — Ambulatory Visit (HOSPITAL_COMMUNITY)
Admission: RE | Admit: 2022-03-27 | Discharge: 2022-03-27 | Disposition: A | Discharge status: Home / Self Care | Payer: Medicare Other | Source: Ambulatory Visit | Attending: Internal Medicine | Admitting: Internal Medicine

## 2022-03-27 DIAGNOSIS — Z86010 Personal history of colonic polyps: Secondary | ICD-10-CM | POA: Insufficient documentation

## 2022-03-27 DIAGNOSIS — R112 Nausea with vomiting, unspecified: Secondary | ICD-10-CM | POA: Insufficient documentation

## 2022-04-02 ENCOUNTER — Other Ambulatory Visit: Payer: Self-pay | Admitting: Family Medicine

## 2022-04-02 DIAGNOSIS — R001 Bradycardia, unspecified: Secondary | ICD-10-CM

## 2022-04-06 ENCOUNTER — Other Ambulatory Visit: Payer: Self-pay

## 2022-04-06 ENCOUNTER — Telehealth: Payer: Self-pay

## 2022-04-06 DIAGNOSIS — F321 Major depressive disorder, single episode, moderate: Secondary | ICD-10-CM

## 2022-04-06 DIAGNOSIS — F411 Generalized anxiety disorder: Secondary | ICD-10-CM

## 2022-04-06 MED ORDER — ESCITALOPRAM OXALATE 10 MG PO TABS: 10.0000 mg | ORAL_TABLET | Freq: Every day | ORAL | 2 refills | Status: DC

## 2022-04-06 NOTE — Telephone Encounter (Signed)
Prescription Request  04/06/2022  Is this a "Controlled Substance" medicine? No  LOV: 09/22/21  What is the name of the medication or equipment? escitalopram (LEXAPRO) 10 MG tablet [[id number]]   Have you contacted your pharmacy to request a refill? Yes   Which pharmacy would you like this sent to?  [id number] [place], [place] - [address] [place] [address] Phone: [phone] Fax: [phone]    Patient notified that their request is being sent to the clinical staff for review and that they should receive a response within 2 business days.   Please advise at Mobile [phone] (mobile)

## 2022-04-09 ENCOUNTER — Encounter: Payer: Self-pay | Admitting: Internal Medicine

## 2022-05-03 ENCOUNTER — Encounter: Payer: Self-pay | Admitting: Internal Medicine

## 2022-05-03 ENCOUNTER — Ambulatory Visit (AMBULATORY_SURGERY_CENTER): Payer: Medicare Other | Admitting: Internal Medicine

## 2022-05-03 VITALS — BP 145/67 | HR 60 | Temp 97.8°F | Resp 16 | Ht 61.0 in | Wt 251.0 lb

## 2022-05-03 DIAGNOSIS — K319 Disease of stomach and duodenum, unspecified: Secondary | ICD-10-CM

## 2022-05-03 DIAGNOSIS — R1310 Dysphagia, unspecified: Secondary | ICD-10-CM

## 2022-05-03 DIAGNOSIS — K259 Gastric ulcer, unspecified as acute or chronic, without hemorrhage or perforation: Secondary | ICD-10-CM

## 2022-05-03 DIAGNOSIS — K449 Diaphragmatic hernia without obstruction or gangrene: Secondary | ICD-10-CM

## 2022-05-03 DIAGNOSIS — R112 Nausea with vomiting, unspecified: Secondary | ICD-10-CM

## 2022-05-03 DIAGNOSIS — R197 Diarrhea, unspecified: Secondary | ICD-10-CM

## 2022-05-03 DIAGNOSIS — K2980 Duodenitis without bleeding: Secondary | ICD-10-CM

## 2022-05-03 DIAGNOSIS — K222 Esophageal obstruction: Secondary | ICD-10-CM | POA: Diagnosis not present

## 2022-05-03 DIAGNOSIS — R1013 Epigastric pain: Secondary | ICD-10-CM

## 2022-05-03 MED ORDER — PANTOPRAZOLE SODIUM 40 MG PO TBEC: 40.0000 mg | DELAYED_RELEASE_TABLET | Freq: Two times a day (BID) | ORAL | 3 refills | Status: DC

## 2022-05-03 MED ORDER — SODIUM CHLORIDE 0.9 % IV SOLN: 500.0000 mL | Freq: Once | INTRAVENOUS | Status: DC

## 2022-05-03 NOTE — Progress Notes (Signed)
Pt's states no medical or surgical changes since previsit or office visit. 

## 2022-05-03 NOTE — Progress Notes (Signed)
Called to room to assist during endoscopic procedure.  Patient ID and intended procedure confirmed with present staff. Received instructions for my participation in the procedure from the performing physician.  

## 2022-05-03 NOTE — Progress Notes (Signed)
Report to pacu rn. Vss. Care resumed by rn. 

## 2022-05-03 NOTE — Patient Instructions (Addendum)
-   Discharge patient to home (with escort). - Await pathology results. - Use Protonix (pantoprazole) 40 mg PO BID for 12 weeks. - Repeat upper endoscopy in 2 months to check healing. Can consider esophageal dilation at that time. - The findings and recommendations were discussed with the patient.  Endoscopy instructions given to and reviewed with patient and care partner.  YOU HAD AN ENDOSCOPIC PROCEDURE TODAY AT [facility]:   Refer to the procedure report that was given to you for any specific questions about what was found during the examination.  If the procedure report does not answer your questions, please call your gastroenterologist to clarify.  If you requested that your care partner not be given the details of your procedure findings, then the procedure report has been included in a sealed envelope for you to review at your convenience later.  YOU SHOULD EXPECT: Some feelings of bloating in the abdomen. Passage of more gas than usual.  Walking can help get rid of the air that was put into your GI tract during the procedure and reduce the bloating. If you had a lower endoscopy (such as a colonoscopy or flexible sigmoidoscopy) you may notice spotting of blood in your stool or on the toilet paper. If you underwent a bowel prep for your procedure, you may not have a normal bowel movement for a few days.  Please Note:  You might notice some irritation and congestion in your nose or some drainage.  This is from the oxygen used during your procedure.  There is no need for concern and it should clear up in a day or so.  SYMPTOMS TO REPORT IMMEDIATELY: Following upper endoscopy (EGD)  Vomiting of blood or coffee ground material  New chest pain or pain under the shoulder blades  Painful or persistently difficult swallowing  New shortness of breath  Fever of 100F or higher  Black, tarry-looking stools  For urgent or emergent issues, a gastroenterologist can be reached at any  hour by calling [phone]. Do not use MyChart messaging for urgent concerns.    DIET:  We do recommend a small meal at first, but then you may proceed to your regular diet.  Drink plenty of fluids but you should avoid alcoholic beverages for 24 hours.  ACTIVITY:  You should plan to take it easy for the rest of today and you should NOT DRIVE or use heavy machinery until tomorrow (because of the sedation medicines used during the test).    FOLLOW UP: Our staff will call the number listed on your records the next business day following your procedure.  We will call around 7:15- 8:00 am to check on you and address any questions or concerns that you may have regarding the information given to you following your procedure. If we do not reach you, we will leave a message.     If any biopsies were taken you will be contacted by phone or by letter within the next 1-3 weeks.  Please call us at [phone] if you have not heard about the biopsies in 3 weeks.    SIGNATURES/CONFIDENTIALITY: You and/or your care partner have signed paperwork which will be entered into your electronic medical record.  These signatures attest to the fact that that the information above on your After Visit Summary has been reviewed and is understood.  Full responsibility of the confidentiality of this discharge information lies with you and/or your care-partner.

## 2022-05-03 NOTE — Progress Notes (Signed)
GASTROENTEROLOGY PROCEDURE H&P NOTE   Primary Care Physician: Donita Brooks, MD    Reason for Procedure:   Dysphagia, N&V, epigastric ab pain  Plan:    EGD  Patient is appropriate for endoscopic procedure(s) in the ambulatory ([facility]) setting.  The nature of the procedure, as well as the risks, benefits, and alternatives were carefully and thoroughly reviewed with the patient. Ample time for discussion and questions allowed. The patient understood, was satisfied, and agreed to proceed.     HPI: [name] [name] is a 62 y.o. female who presents for EGD for evaluation of dysphagia, N&V, and epigastric ab pain .  Patient was most recently seen in the Gastroenterology Clinic on 03/19/22.  No interval change in medical history since that appointment. Please refer to that note for full details regarding GI history and clinical presentation.   Past Medical History:  Diagnosis Date   Anxiety 2008   seen in ED /w chest pain but told that she was having anxiety   Arthritis    Colon polyps    History of kidney stones 1999   History of migraine    History of stress test 2010   done due to chest pain /w Dr. Jacinto Halim. Pt. told that it was wnl.   Hives of unknown origin    told by PCP Terri Piedra) to use Zrytec BID to control hives    Hypertension    Kidney stones    Neuromuscular disorder (HCC)    back pain   Obesity, unspecified    Sleep difficulties    has had a sleep study - t old that she didn't meet criteria for machine    Syncope    due to pain    Past Surgical History:  Procedure Laterality Date   COLONOSCOPY  2016   DECOMPRESSIVE LUMBAR LAMINECTOMY LEVEL 1 N/A 10/28/2013   Procedure: DECOMPRESSION L4-5;  Surgeon: Venita Lick, MD;  Location: [facility] OR;  Service: Orthopedics;  Laterality: N/A;   KNEE ARTHROPLASTY Right 12/24/2018   Procedure: COMPUTER ASSISTED TOTAL KNEE ARTHROPLASTY;  Surgeon: Samson Frederic, MD;  Location: [facility]S;  Service: Orthopedics;  Laterality: Right;    KNEE ARTHROPLASTY Left 09/17/2019   Procedure: COMPUTER ASSISTED TOTAL KNEE ARTHROPLASTY;  Surgeon: Samson Frederic, MD;  Location: [facility];  Service: Orthopedics;  Laterality: Left;   KNEE ARTHROSCOPY  12/2008   left knee   KNEE ARTHROSCOPY Right 01/27/2018   PARTIAL HYSTERECTOMY  10/1994   thumb surgery Right 03/1975    Prior to Admission medications   Medication Sig Start Date End Date Taking? Authorizing Provider  atorvastatin (LIPITOR) 20 MG tablet TAKE 1 TABLET BY MOUTH EVERY DAY 07/13/21  Yes Donita Brooks, MD  cetirizine (ZYRTEC) 10 MG tablet Take 10 mg by mouth daily.    Yes [provider]  cetirizine (ZYRTEC) 10 MG tablet Take 10 mg by mouth daily.   Yes [provider]  diltiazem (CARDIZEM CD) 120 MG 24 hr capsule TAKE 1 CAPSULE BY MOUTH EVERY DAY 04/02/22  Yes Donita Brooks, MD  famotidine (PEPCID) 40 MG tablet Take 1 tablet (40 mg total) by mouth daily. 02/06/22  Yes Donita Brooks, MD  hydrochlorothiazide (MICROZIDE) 12.5 MG capsule TAKE 1 CAPSULE BY MOUTH EVERY DAY 06/23/21  Yes Donita Brooks, MD  Multiple Vitamin (MULTIVITAMIN) tablet Take 1 tablet by mouth daily.   Yes [provider]  Naproxen Sodium (ALEVE PO) Take by mouth.   Yes [provider]  NON  FORMULARY Diltiazem 120 mg   Yes [provider]  pantoprazole (PROTONIX) 40 MG tablet TAKE 1 TABLET BY MOUTH EVERY DAY 01/12/22  Yes Susy Frizzle, MD  ALPRAZolam Duanne Moron) 0.5 MG tablet Take 1 tablet (0.5 mg total) by mouth 2 (two) times daily as needed for anxiety or sleep. Patient not taking: Reported on 03/19/2022 11/24/20   Susy Frizzle, MD  celecoxib (CELEBREX) 200 MG capsule Take 200 mg by mouth daily. Patient not taking: Reported on 03/19/2022 06/18/21   [provider]  clonazePAM (KLONOPIN) 0.5 MG tablet Take 1 tablet (0.5 mg total) by mouth 2 (two) times daily as needed for anxiety. Patient not taking: Reported on 05/03/2022 09/15/21   Susy Frizzle, MD  escitalopram (LEXAPRO) 10 MG tablet Take 1 tablet (10 mg total) by mouth daily. Patient not taking: Reported on 05/03/2022 04/06/22   Susy Frizzle, MD  triamcinolone cream (KENALOG) 0.1 % Apply 1 Application topically 2 (two) times daily. 12/24/21   Rising, Wells Guiles, PA-C  venlafaxine XR (EFFEXOR-XR) 75 MG 24 hr capsule TAKE 1 CAPSULE (75 MG TOTAL) BY MOUTH DAILY WITH BREAKFAST. STOP ESCITALOPRAM Patient not taking: Reported on 03/19/2022 10/17/21   Susy Frizzle, MD    Current Outpatient Medications  Medication Sig Dispense Refill   atorvastatin (LIPITOR) 20 MG tablet TAKE 1 TABLET BY MOUTH EVERY DAY 90 tablet 3   cetirizine (ZYRTEC) 10 MG tablet Take 10 mg by mouth daily.      cetirizine (ZYRTEC) 10 MG tablet Take 10 mg by mouth daily.     diltiazem (CARDIZEM CD) 120 MG 24 hr capsule TAKE 1 CAPSULE BY MOUTH EVERY DAY 90 capsule 2   famotidine (PEPCID) 40 MG tablet Take 1 tablet (40 mg total) by mouth daily. 90 tablet 3   hydrochlorothiazide (MICROZIDE) 12.5 MG capsule TAKE 1 CAPSULE BY MOUTH EVERY DAY 90 capsule 3   Multiple Vitamin (MULTIVITAMIN) tablet Take 1 tablet by mouth daily.     Naproxen Sodium (ALEVE PO) Take by mouth.     NON FORMULARY Diltiazem 120 mg     pantoprazole (PROTONIX) 40 MG tablet TAKE 1 TABLET BY MOUTH EVERY DAY 90 tablet 1   ALPRAZolam (XANAX) 0.5 MG tablet Take 1 tablet (0.5 mg total) by mouth 2 (two) times daily as needed for anxiety or sleep. (Patient not taking: Reported on 03/19/2022) 30 tablet 0   celecoxib (CELEBREX) 200 MG capsule Take 200 mg by mouth daily. (Patient not taking: Reported on 03/19/2022)     clonazePAM (KLONOPIN) 0.5 MG tablet Take 1 tablet (0.5 mg total) by mouth 2 (two) times daily as needed for anxiety. (Patient not taking: Reported on 05/03/2022) 60 tablet 1   escitalopram (LEXAPRO) 10 MG tablet Take 1 tablet (10 mg total) by mouth daily. (Patient not taking: Reported on 05/03/2022) 90 tablet 2   triamcinolone cream (KENALOG)  0.1 % Apply 1 Application topically 2 (two) times daily. 30 g 0   venlafaxine XR (EFFEXOR-XR) 75 MG 24 hr capsule TAKE 1 CAPSULE (75 MG TOTAL) BY MOUTH DAILY WITH BREAKFAST. STOP ESCITALOPRAM (Patient not taking: Reported on 03/19/2022) 90 capsule 1   Current Facility-Administered Medications  Medication Dose Route Frequency Provider Last Rate Last Admin   0.9 %  sodium chloride infusion  500 mL Intravenous Once Sharyn Creamer, MD       Facility-Administered Medications Ordered in Other Visits  Medication Dose Route Frequency Provider Last Rate Last Admin   gadopentetate dimeglumine (MAGNEVIST) injection 20  mL  20 mL Intravenous Once PRN Britt Bottom, MD        Allergies as of 05/03/2022 - Review Complete 12/24/2021  Allergen Reaction Noted   Gabapentin  07/28/2015   Other  12/19/2018   Penicillin g  11/13/2019   Penicillins Hives and Rash 07/10/2013    Family History  Problem Relation Age of Onset   Stroke Father    Diabetes Other    Hyperlipidemia Other    Heart disease Other    Hypertension Other    Cancer Other    Breast cancer Neg Hx    Colon cancer Neg Hx    Esophageal cancer Neg Hx    Stomach cancer Neg Hx    Rectal cancer Neg Hx     Social History   Socioeconomic History   Marital status: Single    Spouse name: Not on file   Number of children: 0   Years of education: Not on file   Highest education level: Not on file  Occupational History   Occupation: Education administrator  Tobacco Use   Smoking status: Never   Smokeless tobacco: Never  Vaping Use   Vaping Use: Never used  Substance and Sexual Activity   Alcohol use: Yes    Comment: rare   Drug use: No   Sexual activity: Not on file  Other Topics Concern   Not on file  Social History Narrative   Not on file   Social Determinants of Health   Financial Resource Strain: Not on file  Food Insecurity: Not on file  Transportation Needs: Not on file  Physical Activity: Not on file  Stress:  Not on file  Social Connections: Not on file  Intimate Partner Violence: Not on file    Physical Exam: Vital signs in last 24 hours: BP (!) 158/62   Pulse 74   Temp 97.8 F (36.6 C) (Temporal)   Ht 5\' 1"  (1.549 m)   Wt 251 lb (113.9 kg)   SpO2 97%   BMI 47.43 kg/m  GEN: NAD EYE: Sclerae anicteric ENT: MMM CV: Non-tachycardic Pulm: No increased WOB GI: Soft NEURO:  Alert & Oriented   Christia Reading, MD [facility]   05/03/2022 1:24 PM

## 2022-05-03 NOTE — Op Note (Signed)
[facility] Patient Name: [name] [name] Procedure Date: 05/03/2022 1:30 PM MRN: [id number] Endoscopist: Particia Lather , , [id number] Age: 62 Referring MD:  Date of Birth: 09-24-1960 Gender: Female Account #: [id number] Procedure:                Upper GI endoscopy Indications:              Epigastric abdominal pain, Dysphagia, Diarrhea,                            Nausea with vomiting Medicines:                Monitored Anesthesia Care Procedure:                Pre-Anesthesia Assessment:                           - Prior to the procedure, a History and Physical                            was performed, and patient medications and                            allergies were reviewed. The patient's tolerance of                            previous anesthesia was also reviewed. The risks                            and benefits of the procedure and the sedation                            options and risks were discussed with the patient.                            All questions were answered, and informed consent                            was obtained. Prior Anticoagulants: The patient has                            taken no anticoagulant or antiplatelet agents. ASA                            Grade Assessment: II - A patient with mild systemic                            disease. After reviewing the risks and benefits,                            the patient was deemed in satisfactory condition to                            undergo the procedure.  After obtaining informed consent, the endoscope was                            passed under direct vision. Throughout the                            procedure, the patient's blood pressure, pulse, and                            oxygen saturations were monitored continuously. The                            GIF [id number] #[id number] was introduced through the                            mouth, and advanced to the second  part of duodenum.                            The upper GI endoscopy was accomplished without                            difficulty. The patient tolerated the procedure                            well. Scope In: Scope Out: Findings:                 One benign-appearing, intrinsic moderate                            (circumferential scarring or stenosis; an endoscope                            may pass) stenosis was found at the                            gastroesophageal junction. This stenosis measured                            less than one cm (in length). The stenosis was                            traversed.                           Biopsies were taken with a cold forceps in the                            proximal esophagus and in the distal esophagus for                            histology.                           A hiatal hernia was present.  Fluid was found in the gastric body.                           One non-bleeding cratered gastric ulcer with a                            clean ulcer base (Forrest Class III) was found in                            the gastric antrum. The lesion was 7 mm in largest                            dimension. Biopsies were taken with a cold forceps                            for histology.                           The examined duodenum was normal. Biopsies were                            taken with a cold forceps for histology. Complications:            No immediate complications. Estimated Blood Loss:     Estimated blood loss was minimal. Impression:               - Benign-appearing esophageal stenosis.                           - Hiatal hernia.                           - Gastric fluid.                           - Non-bleeding gastric ulcer with a clean ulcer                            base (Forrest Class III). Biopsied.                           - Normal examined duodenum. Biopsied.                           -  Biopsies were taken with a cold forceps for                            histology in the proximal esophagus and in the                            distal esophagus. Recommendation:           - Discharge patient to home (with escort).                           - Await pathology results.                           -  Use Protonix (pantoprazole) 40 mg PO BID for 12                            weeks.                           - Repeat upper endoscopy in 2 months to check                            healing. Can consider esophageal dilation at that                            time.                           - The findings and recommendations were discussed                            with the patient. Dr Georgian Co "Lyndee Leo" Lorenso Courier,  05/03/2022 1:59:11 PM

## 2022-05-04 ENCOUNTER — Telehealth: Payer: Self-pay

## 2022-05-04 NOTE — Telephone Encounter (Signed)
Follow up call to pt, no answer. 

## 2022-05-09 ENCOUNTER — Encounter: Payer: Self-pay | Admitting: Internal Medicine

## 2022-05-16 ENCOUNTER — Telehealth: Payer: Self-pay | Admitting: Internal Medicine

## 2022-05-16 ENCOUNTER — Encounter: Payer: Self-pay | Admitting: Internal Medicine

## 2022-05-16 NOTE — Telephone Encounter (Signed)
Inbound call, patient stated she had questions about her results from her endoscopy. Patient requested to speak to a nurse.

## 2022-05-17 NOTE — Telephone Encounter (Signed)
Spoke with patient. She wanted to be certain she was not supposed to be doing anything different or in addition to her instructions given to her at discharge after EGD.  Explained the biopsy results meaning inflammation of the tissues and the Protonix will help to allow healing. No bacterial infection, so antibiotics are not needed. Patient has been taking Protonix on a full stomach. She will begin taking it 30 minutes ac meals twice daily to maximize benefit, avoid heavy meals, high fat/fried foods and any food that typically causes her GI distress.

## 2022-06-24 ENCOUNTER — Encounter: Payer: Self-pay | Admitting: Internal Medicine

## 2022-06-25 ENCOUNTER — Telehealth: Payer: Self-pay | Admitting: Internal Medicine

## 2022-06-25 NOTE — Telephone Encounter (Signed)
Inbound call from patient ,stated she is having severe diarrhea since Thursday night and can't keep nothing in her stomach want to know what can she do about .Please advise

## 2022-06-25 NOTE — Telephone Encounter (Signed)
Spoke with the patient. She reports she began feeling queasy 5 days ago. She vomited x 1. The next day she developed diarrhea. The diarrhea persisted until yesterday. She began taking Imodium 2 days ago. Yesterday she thinks she took 4 Imodium total. She has focused on hydration with sports drinks Pedialyte and starch foods like rice. Today she feels tired and "wiped out." She has not had any further nausea, vomiting or diarrhea. Discussed diet to include broth soups, toast, applesauce, bananas and avoid dairy products, greasy or spicy foods. Patient plans to contact us if she fails to continue to improve or she acutely worsens.

## 2022-06-26 ENCOUNTER — Encounter: Payer: Self-pay | Admitting: Internal Medicine

## 2022-07-06 ENCOUNTER — Other Ambulatory Visit: Payer: Self-pay | Admitting: Family Medicine

## 2022-07-06 NOTE — Telephone Encounter (Signed)
Requested medication (s) are due for refill today: yes  Requested medication (s) are on the active medication list: yes  Last refill:  06/23/21 #90 3 RF  Future visit scheduled: no  Notes to clinic:  due for CPE    Requested Prescriptions  Pending Prescriptions Disp Refills   hydrochlorothiazide (MICROZIDE) 12.5 MG capsule [Pharmacy Med Name: HYDROCHLOROTHIAZIDE 12.5 MG CP] 90 capsule 3    Sig: TAKE 1 CAPSULE BY MOUTH EVERY DAY     Cardiovascular: Diuretics - Thiazide Failed - 07/06/2022  1:58 AM      Failed - Cr in normal range and within 180 days    Creat  Date Value Ref Range Status  09/15/2021 0.78 0.50 - 1.05 mg/dL Final         Failed - K in normal range and within 180 days    Potassium  Date Value Ref Range Status  09/15/2021 4.5 3.5 - 5.3 mmol/L Final         Failed - Na in normal range and within 180 days    Sodium  Date Value Ref Range Status  09/15/2021 142 135 - 146 mmol/L Final         Failed - Last BP in normal range    BP Readings from Last 1 Encounters:  05/03/22 (!) 145/67         Failed - Valid encounter within last 6 months    Recent Outpatient Visits           9 months ago Nausea and vomiting, unspecified vomiting type   [place] Susy Frizzle, MD   9 months ago Nausea and vomiting, unspecified vomiting type   [place] Susy Frizzle, MD   1 year ago Near syncope   [place] Pickard, Cammie Mcgee, MD   1 year ago General medical exam   [place] Susy Frizzle, MD   2 years ago Pure hypercholesterolemia   [place] Pickard, Cammie Mcgee, MD

## 2022-07-11 ENCOUNTER — Ambulatory Visit (AMBULATORY_SURGERY_CENTER): Payer: Medicare Other | Admitting: Internal Medicine

## 2022-07-11 ENCOUNTER — Other Ambulatory Visit: Payer: Medicare Other

## 2022-07-11 ENCOUNTER — Encounter: Payer: Self-pay | Admitting: Internal Medicine

## 2022-07-11 VITALS — BP 107/45 | HR 63 | Temp 98.5°F | Resp 14 | Ht 60.0 in | Wt 239.0 lb

## 2022-07-11 DIAGNOSIS — R197 Diarrhea, unspecified: Secondary | ICD-10-CM | POA: Diagnosis not present

## 2022-07-11 DIAGNOSIS — R1310 Dysphagia, unspecified: Secondary | ICD-10-CM

## 2022-07-11 DIAGNOSIS — R1013 Epigastric pain: Secondary | ICD-10-CM

## 2022-07-11 MED ORDER — SODIUM CHLORIDE 0.9 % IV SOLN: 500.0000 mL | Freq: Once | INTRAVENOUS | Status: DC

## 2022-07-11 NOTE — Progress Notes (Signed)
Report to PACU, RN, vss, BBS= Clear.  

## 2022-07-11 NOTE — Progress Notes (Signed)
GASTROENTEROLOGY PROCEDURE H&P NOTE   Primary Care Physician: Susy Frizzle, MD    Reason for Procedure:   Dysphagia, epigastric ab pain, N&V  Plan:    EGD  Patient is appropriate for endoscopic procedure(s) in the ambulatory ([facility]) setting.  The nature of the procedure, as well as the risks, benefits, and alternatives were carefully and thoroughly reviewed with the patient. Ample time for discussion and questions allowed. The patient understood, was satisfied, and agreed to proceed.     HPI: [name] [name] is a 62 y.o. female who presents for EGD for evaluation of dysphagia, epigastric ab pain, N&V .  Patient was most recently seen in the Gastroenterology Clinic on 03/19/22.  No interval change in medical history since that appointment. Please refer to that note for full details regarding GI history and clinical presentation.   Past Medical History:  Diagnosis Date   Anxiety 2008   seen in ED /w chest pain but told that she was having anxiety   Arthritis    Colon polyps    History of kidney stones 1999   History of migraine    History of stress test 2010   done due to chest pain /w Dr. Einar Gip. Pt. told that it was wnl.   Hives of unknown origin    told by PCP Allyson Sabal) to use Zrytec BID to control hives    Hypertension    Kidney stones    Neuromuscular disorder ([place])    back pain   Obesity, unspecified    Sleep difficulties    has had a sleep study - t old that she didn't meet criteria for machine    Syncope    due to pain    Past Surgical History:  Procedure Laterality Date   COLONOSCOPY  2016   DECOMPRESSIVE LUMBAR LAMINECTOMY LEVEL 1 N/A 10/28/2013   Procedure: DECOMPRESSION L4-5;  Surgeon: Melina Schools, MD;  Location: [facility];  Service: Orthopedics;  Laterality: N/A;   KNEE ARTHROPLASTY Right 12/24/2018   Procedure: COMPUTER ASSISTED TOTAL KNEE ARTHROPLASTY;  Surgeon: Rod Can, MD;  Location: [facility];  Service: Orthopedics;  Laterality: Right;   KNEE  ARTHROPLASTY Left 09/17/2019   Procedure: COMPUTER ASSISTED TOTAL KNEE ARTHROPLASTY;  Surgeon: Rod Can, MD;  Location: [facility];  Service: Orthopedics;  Laterality: Left;   KNEE ARTHROSCOPY  12/2008   left knee   KNEE ARTHROSCOPY Right 01/27/2018   PARTIAL HYSTERECTOMY  10/1994   thumb surgery Right 03/1975    Prior to Admission medications   Medication Sig Start Date End Date Taking? Authorizing Provider  atorvastatin (LIPITOR) 20 MG tablet TAKE 1 TABLET BY MOUTH EVERY DAY 07/13/21  Yes Susy Frizzle, MD  diltiazem (CARDIZEM CD) 120 MG 24 hr capsule TAKE 1 CAPSULE BY MOUTH EVERY DAY 04/02/22  Yes Susy Frizzle, MD  hydrochlorothiazide (MICROZIDE) 12.5 MG capsule TAKE 1 CAPSULE BY MOUTH EVERY DAY 07/06/22  Yes Susy Frizzle, MD  ALPRAZolam Duanne Moron) 0.5 MG tablet Take 1 tablet (0.5 mg total) by mouth 2 (two) times daily as needed for anxiety or sleep. Patient not taking: Reported on 03/19/2022 11/24/20   Susy Frizzle, MD  clonazePAM (KLONOPIN) 0.5 MG tablet Take 1 tablet (0.5 mg total) by mouth 2 (two) times daily as needed for anxiety. Patient not taking: Reported on 05/03/2022 09/15/21   Susy Frizzle, MD  famotidine (PEPCID) 40 MG tablet Take 1 tablet (40 mg total) by mouth daily. 02/06/22   Susy Frizzle,  MD  Multiple Vitamin (MULTIVITAMIN) tablet Take 1 tablet by mouth daily.    [provider]  Naproxen Sodium (ALEVE PO) Take by mouth.    [provider]  NON FORMULARY Diltiazem 120 mg Patient not taking: Reported on 07/11/2022    [provider]  pantoprazole (PROTONIX) 40 MG tablet Take 1 tablet (40 mg total) by mouth 2 (two) times daily. 05/03/22   Sharyn Creamer, MD  venlafaxine XR (EFFEXOR-XR) 75 MG 24 hr capsule TAKE 1 CAPSULE (75 MG TOTAL) BY MOUTH DAILY WITH BREAKFAST. STOP ESCITALOPRAM Patient not taking: Reported on 03/19/2022 10/17/21   Susy Frizzle, MD    Current Outpatient Medications  Medication Sig Dispense Refill    atorvastatin (LIPITOR) 20 MG tablet TAKE 1 TABLET BY MOUTH EVERY DAY 90 tablet 3   diltiazem (CARDIZEM CD) 120 MG 24 hr capsule TAKE 1 CAPSULE BY MOUTH EVERY DAY 90 capsule 2   hydrochlorothiazide (MICROZIDE) 12.5 MG capsule TAKE 1 CAPSULE BY MOUTH EVERY DAY 30 capsule 1   ALPRAZolam (XANAX) 0.5 MG tablet Take 1 tablet (0.5 mg total) by mouth 2 (two) times daily as needed for anxiety or sleep. (Patient not taking: Reported on 03/19/2022) 30 tablet 0   clonazePAM (KLONOPIN) 0.5 MG tablet Take 1 tablet (0.5 mg total) by mouth 2 (two) times daily as needed for anxiety. (Patient not taking: Reported on 05/03/2022) 60 tablet 1   famotidine (PEPCID) 40 MG tablet Take 1 tablet (40 mg total) by mouth daily. 90 tablet 3   Multiple Vitamin (MULTIVITAMIN) tablet Take 1 tablet by mouth daily.     Naproxen Sodium (ALEVE PO) Take by mouth.     NON FORMULARY Diltiazem 120 mg (Patient not taking: Reported on 07/11/2022)     pantoprazole (PROTONIX) 40 MG tablet Take 1 tablet (40 mg total) by mouth 2 (two) times daily. 90 tablet 3   venlafaxine XR (EFFEXOR-XR) 75 MG 24 hr capsule TAKE 1 CAPSULE (75 MG TOTAL) BY MOUTH DAILY WITH BREAKFAST. STOP ESCITALOPRAM (Patient not taking: Reported on 03/19/2022) 90 capsule 1   Current Facility-Administered Medications  Medication Dose Route Frequency Provider Last Rate Last Admin   0.9 %  sodium chloride infusion  500 mL Intravenous Once Sharyn Creamer, MD       Facility-Administered Medications Ordered in Other Visits  Medication Dose Route Frequency Provider Last Rate Last Admin   gadopentetate dimeglumine (MAGNEVIST) injection 20 mL  20 mL Intravenous Once PRN Britt Bottom, MD        Allergies as of 07/11/2022 - Review Complete 07/11/2022  Allergen Reaction Noted   Gabapentin  07/28/2015   Other  12/19/2018   Penicillin g  11/13/2019   Penicillins Hives and Rash 07/10/2013    Family History  Problem Relation Age of Onset   Stroke Father    Diabetes Other     Hyperlipidemia Other    Heart disease Other    Hypertension Other    Cancer Other    Breast cancer Neg Hx    Colon cancer Neg Hx    Esophageal cancer Neg Hx    Stomach cancer Neg Hx    Rectal cancer Neg Hx     Social History   Socioeconomic History   Marital status: Single    Spouse name: Not on file   Number of children: 0   Years of education: Not on file   Highest education level: Not on file  Occupational History   Occupation: Education administrator  Tobacco Use   Smoking status: Never   Smokeless tobacco: Never  Vaping Use   Vaping Use: Never used  Substance and Sexual Activity   Alcohol use: Yes    Comment: rare   Drug use: No   Sexual activity: Not on file  Other Topics Concern   Not on file  Social History Narrative   Not on file   Social Determinants of Health   Financial Resource Strain: Not on file  Food Insecurity: Not on file  Transportation Needs: Not on file  Physical Activity: Not on file  Stress: Not on file  Social Connections: Not on file  Intimate Partner Violence: Not on file    Physical Exam: Vital signs in last 24 hours: BP (!) 155/58   Pulse 81   Temp 98.5 F (36.9 C)   Ht 5' (1.524 m)   Wt 239 lb (108.4 kg)   SpO2 97%   BMI 46.68 kg/m  GEN: NAD EYE: Sclerae anicteric ENT: MMM CV: Non-tachycardic Pulm: No increased WOB GI: Soft NEURO:  Alert & Oriented   Christia Reading, MD [facility]   07/11/2022 9:45 AM

## 2022-07-11 NOTE — Op Note (Signed)
[facility] Patient Name: [name] [name] Procedure Date: 07/11/2022 9:51 AM MRN: [id number] Endoscopist: Adline Mango Mehama , , [id number] Age: 62 Referring MD:  Date of Birth: 1960/10/26 Gender: Female Account #: [id number] Procedure:                Upper GI endoscopy Indications:              Epigastric abdominal pain, Dysphagia, Diarrhea,                            Nausea with vomiting Medicines:                Monitored Anesthesia Care Procedure:                Pre-Anesthesia Assessment:                           - Prior to the procedure, a History and Physical                            was performed, and patient medications and                            allergies were reviewed. The patient's tolerance of                            previous anesthesia was also reviewed. The risks                            and benefits of the procedure and the sedation                            options and risks were discussed with the patient.                            All questions were answered, and informed consent                            was obtained. Prior Anticoagulants: The patient has                            taken no anticoagulant or antiplatelet agents. ASA                            Grade Assessment: III - A patient with severe                            systemic disease. After reviewing the risks and                            benefits, the patient was deemed in satisfactory                            condition to undergo the procedure.  After obtaining informed consent, the endoscope was                            passed under direct vision. Throughout the                            procedure, the patient's blood pressure, pulse, and                            oxygen saturations were monitored continuously. The                            GIF [id number] was introduced through the                            mouth, and advanced to the  second part of duodenum.                            The upper GI endoscopy was accomplished without                            difficulty. The patient tolerated the procedure                            well. Scope In: Scope Out: Findings:                 The examined esophagus was normal. Biopsies were                            taken with a cold forceps for histology.                           A small hiatal hernia was present.                           Localized mildly erythematous mucosa without                            bleeding was found at the incisura. This was                            biopsied with a cold forceps for histology.                           The examined duodenum was normal. Biopsies were                            taken with a cold forceps for histology. Complications:            No immediate complications. Estimated Blood Loss:     Estimated blood loss was minimal. Impression:               - Normal esophagus. Biopsied.                           -  Small hiatal hernia.                           - Erythematous mucosa in the incisura. Biopsied.                           - Normal examined duodenum. Biopsied. Recommendation:           - Discharge patient to home (with escort).                           - Await pathology results.                           - Will check stool studies with GI profile and                            fecal elastase.                           - Continue with Imodium and can use bismuth                            subsalicylate PRN to help with diarrhea.                           - Return to GI clinic in 4 weeks for follow up.                           - The findings and recommendations were discussed                            with the patient. Dr Georgian Co "Lyndee Leo" Lorenso Courier,  07/11/2022 10:14:38 AM

## 2022-07-11 NOTE — Patient Instructions (Addendum)
Wait on results for biopsies taken to today Await pathology results. Resume previous diet and continue present medications  Will check stool studies with GI profile and fecal elastase  Continue with imodium and can use bismuth subsalicylate as needed to help with diarrhea  Return to GI office in 4 weeks for follow-up - 09/26/2022 at 8:50 am (call to reschedule this if the time or date will not work for you) Pick up stool kit from basement today    YOU HAD AN ENDOSCOPIC PROCEDURE TODAY AT [facility]:   Refer to the procedure report that was given to you for any specific questions about what was found during the examination.  If the procedure report does not answer your questions, please call your gastroenterologist to clarify.  If you requested that your care partner not be given the details of your procedure findings, then the procedure report has been included in a sealed envelope for you to review at your convenience later.  YOU SHOULD EXPECT: Some feelings of bloating in the abdomen. Passage of more gas than usual.  Walking can help get rid of the air that was put into your GI tract during the procedure and reduce the bloating. If you had a lower endoscopy (such as a colonoscopy or flexible sigmoidoscopy) you may notice spotting of blood in your stool or on the toilet paper. If you underwent a bowel prep for your procedure, you may not have a normal bowel movement for a few days.  Please Note:  You might notice some irritation and congestion in your nose or some drainage.  This is from the oxygen used during your procedure.  There is no need for concern and it should clear up in a day or so.  SYMPTOMS TO REPORT IMMEDIATELY:  Following upper endoscopy (EGD)  Vomiting of blood or coffee ground material  New chest pain or pain under the shoulder blades  Painful or persistently difficult swallowing  New shortness of breath  Fever of 100F or higher  Black, tarry-looking  stools  For urgent or emergent issues, a gastroenterologist can be reached at any hour by calling [phone]. Do not use MyChart messaging for urgent concerns.    DIET:  We do recommend a small meal at first, but then you may proceed to your regular diet.  Drink plenty of fluids but you should avoid alcoholic beverages for 24 hours.  ACTIVITY:  You should plan to take it easy for the rest of today and you should NOT DRIVE or use heavy machinery until tomorrow (because of the sedation medicines used during the test).    FOLLOW UP: Our staff will call the number listed on your records the next business day following your procedure.  We will call around 7:15- 8:00 am to check on you and address any questions or concerns that you may have regarding the information given to you following your procedure. If we do not reach you, we will leave a message.     If any biopsies were taken you will be contacted by phone or by letter within the next 1-3 weeks.  Please call us at [phone] if you have not heard about the biopsies in 3 weeks.    SIGNATURES/CONFIDENTIALITY: You and/or your care partner have signed paperwork which will be entered into your electronic medical record.  These signatures attest to the fact that that the information above on your After Visit Summary has been reviewed and is understood.  Full responsibility  of the confidentiality of this discharge information lies with you and/or your care-partner.

## 2022-07-11 NOTE — Progress Notes (Signed)
VS by CW  Pt's states no medical or surgical changes since previsit or office visit.  

## 2022-07-12 ENCOUNTER — Other Ambulatory Visit: Payer: Self-pay

## 2022-07-12 ENCOUNTER — Other Ambulatory Visit: Payer: Medicare Other

## 2022-07-12 ENCOUNTER — Telehealth: Payer: Self-pay

## 2022-07-12 ENCOUNTER — Telehealth: Payer: Self-pay | Admitting: Internal Medicine

## 2022-07-12 DIAGNOSIS — R1013 Epigastric pain: Secondary | ICD-10-CM

## 2022-07-12 DIAGNOSIS — R197 Diarrhea, unspecified: Secondary | ICD-10-CM

## 2022-07-12 NOTE — Telephone Encounter (Signed)
Inbound call states she received a call from [facility] and is returning the call. Did not specify what the call was in regards to.

## 2022-07-12 NOTE — Telephone Encounter (Signed)
Spoke with the patient. Confirmed the change of the follow up appointment with Dr Lorenso Courier.  Confirmed instructions for GES.

## 2022-07-12 NOTE — Telephone Encounter (Signed)
  Follow up Call-     07/11/2022    8:54 AM 05/03/2022    1:06 PM  Call back number  Post procedure Call Back phone  # [phone]  Permission to leave phone message Yes Yes     Patient questions:  Do you have a fever, pain , or abdominal swelling? No. Pain Score  0 *  Have you tolerated food without any problems? Yes.    Have you been able to return to your normal activities? Yes.    Do you have any questions about your discharge instructions: Diet   No. Medications  No. Follow up visit  No.  Do you have questions or concerns about your Care? No.  Actions: * If pain score is 4 or above: No action needed, pain <4.

## 2022-07-15 LAB — GI PROFILE, STOOL, PCR

## 2022-07-16 ENCOUNTER — Encounter: Payer: Self-pay | Admitting: Internal Medicine

## 2022-07-18 NOTE — Telephone Encounter (Signed)
Patient called requesting to go over the letter she received with a nurse.

## 2022-07-19 LAB — PANCREATIC ELASTASE, FECAL: Pancreatic Elastase-1, Stool: >500 mcg/g

## 2022-07-19 NOTE — Telephone Encounter (Signed)
Inbound call from patient, wishing to follow up on call in regards to letter she received.

## 2022-07-19 NOTE — Telephone Encounter (Signed)
Spoke with the patient. She wanted to know if there was any change in the plan of care based on her pathology report. Advised her no changes. Asked if she was continuing to have diarrhea. Reports she is having fewer loose stools, stating (maybe 2 or 3 in a day. I haven't needed the Imodium or Pepto lately."

## 2022-07-25 NOTE — Telephone Encounter (Signed)
Pharmacy sent a script via eFax to request a 90 day supply of  hydrochlorothiazide (MICROZIDE) 12.5 MG capsule   Pharmacy:   [id number] [place], [place] - [address] [place] [address] [place] [address] Phone: [phone]  Fax: [phone] DEA #: [id number]    Please advise pharmacist at [phone].

## 2022-07-30 ENCOUNTER — Encounter (HOSPITAL_COMMUNITY)
Admission: RE | Admit: 2022-07-30 | Discharge: 2022-07-30 | Disposition: A | Discharge status: Home / Self Care | Payer: Medicare Other | Source: Ambulatory Visit | Attending: Internal Medicine | Admitting: Internal Medicine

## 2022-07-30 DIAGNOSIS — R197 Diarrhea, unspecified: Secondary | ICD-10-CM

## 2022-07-30 DIAGNOSIS — R1013 Epigastric pain: Secondary | ICD-10-CM | POA: Insufficient documentation

## 2022-07-30 MED ORDER — TECHNETIUM TC 99M MEBROFENIN IV KIT
5.3700 | PACK | Freq: Once | INTRAVENOUS | Status: AC
  Administered 2022-07-30: 5.37 via INTRAVENOUS

## 2022-07-31 ENCOUNTER — Encounter: Payer: Self-pay | Admitting: Internal Medicine

## 2022-08-01 ENCOUNTER — Encounter: Payer: Self-pay | Admitting: Family Medicine

## 2022-08-01 ENCOUNTER — Ambulatory Visit: Payer: Medicare Other | Admitting: Family Medicine

## 2022-08-01 VITALS — BP 132/78 | HR 79 | Ht 60.0 in | Wt 235.0 lb

## 2022-08-01 DIAGNOSIS — Z13220 Encounter for screening for lipoid disorders: Secondary | ICD-10-CM

## 2022-08-01 DIAGNOSIS — Z131 Encounter for screening for diabetes mellitus: Secondary | ICD-10-CM

## 2022-08-01 DIAGNOSIS — Z860100 Personal history of colon polyps, unspecified: Secondary | ICD-10-CM | POA: Insufficient documentation

## 2022-08-01 DIAGNOSIS — R197 Diarrhea, unspecified: Secondary | ICD-10-CM | POA: Diagnosis not present

## 2022-08-01 DIAGNOSIS — I10 Essential (primary) hypertension: Secondary | ICD-10-CM | POA: Diagnosis not present

## 2022-08-01 DIAGNOSIS — Z1211 Encounter for screening for malignant neoplasm of colon: Secondary | ICD-10-CM | POA: Insufficient documentation

## 2022-08-01 DIAGNOSIS — M5417 Radiculopathy, lumbosacral region: Secondary | ICD-10-CM | POA: Insufficient documentation

## 2022-08-01 DIAGNOSIS — Z1329 Encounter for screening for other suspected endocrine disorder: Secondary | ICD-10-CM

## 2022-08-01 NOTE — Assessment & Plan Note (Signed)
Advise patient to follow up with GI for management Nutrition Referral placed for further evaluation of patient diet possible causing chronic diarrhea. Advise OTC loperamide and bismuth subsalicylate for diarrhea management Explained patient increase fluid intake 8 glasses of water a day to prevent dehydration. Avoid caffeine and alcohol. Add semisolid and low-fiber foods gradually as bowel movements return to normal. Follow a BRAT diet bananas, rice, applesauce, toast helping firm up stool. Follow-up in unable to keep food/fluid down x 24 hours, dizziness, fevers, worsening or persistent symptoms to present to ED or contact primary care provider. Patient verbalizes understanding regarding plan of care and all questions answered.

## 2022-08-01 NOTE — Patient Instructions (Signed)
It was pleasure meeting with you today. Follow up with your primary health provider if any health concerns arises. If symptoms worsen please contact your primary care provider and/or visit the emergency department.  

## 2022-08-01 NOTE — Progress Notes (Signed)
New Patient Office Visit   Subjective   Patient ID: [name] [name], female    DOB: [birth year]  Age: 62 y.o. MRN: [id number]  CC:  Chief Complaint  Patient presents with   Establish Care    HPI [name] [name] 62 year old female, presents to establish care. She  has a past medical history of Anxiety (2008), Arthritis, Colon polyps, History of kidney stones (1999), History of migraine, History of stress test (2010), Hives of unknown origin, Hypertension, Kidney stones, Neuromuscular disorder, Obesity, unspecified, Sleep difficulties, and Syncope.  Diarrhea  This is a chronic problem. The current episode started more than 1 month ago. The problem occurs 5 to 10 times per day. The problem has been unchanged. The stool consistency is described as Mucous and watery. The patient states that diarrhea awakens her from sleep. Associated symptoms include abdominal pain, headaches and vomiting. Pertinent negatives include no chills or fever. Nothing aggravates the symptoms. Risk factors include suspect food intake. She has tried increased fluids and electrolyte solution (pepcid, protonix, imodium) for the symptoms. The treatment provided mild relief.      Outpatient Encounter Medications as of 08/01/2022  Medication Sig   atorvastatin (LIPITOR) 20 MG tablet TAKE 1 TABLET BY MOUTH EVERY DAY   cetirizine (ZYRTEC) 10 MG tablet Take by mouth.   diltiazem (CARDIZEM CD) 120 MG 24 hr capsule TAKE 1 CAPSULE BY MOUTH EVERY DAY   famotidine (PEPCID) 40 MG tablet Take 1 tablet (40 mg total) by mouth daily.   hydrochlorothiazide (MICROZIDE) 12.5 MG capsule TAKE 1 CAPSULE BY MOUTH EVERY DAY   Multiple Vitamin (MULTIVITAMIN) tablet Take 1 tablet by mouth daily.   NON FORMULARY Diltiazem 120 mg   pantoprazole (PROTONIX) 40 MG tablet Take 1 tablet (40 mg total) by mouth 2 (two) times daily.   [DISCONTINUED] ALPRAZolam (XANAX) 0.5 MG tablet Take 1 tablet (0.5 mg total) by mouth 2 (two) times daily as needed for  anxiety or sleep. (Patient not taking: Reported on 03/19/2022)   [DISCONTINUED] clonazePAM (KLONOPIN) 0.5 MG tablet Take 1 tablet (0.5 mg total) by mouth 2 (two) times daily as needed for anxiety. (Patient not taking: Reported on 05/03/2022)   [DISCONTINUED] Naproxen Sodium (ALEVE PO) Take by mouth.   [DISCONTINUED] venlafaxine XR (EFFEXOR-XR) 75 MG 24 hr capsule TAKE 1 CAPSULE (75 MG TOTAL) BY MOUTH DAILY WITH BREAKFAST. STOP ESCITALOPRAM (Patient not taking: Reported on 03/19/2022)   Facility-Administered Encounter Medications as of 08/01/2022  Medication   gadopentetate dimeglumine (MAGNEVIST) injection 20 mL    Past Surgical History:  Procedure Laterality Date   COLONOSCOPY  2016   DECOMPRESSIVE LUMBAR LAMINECTOMY LEVEL 1 N/A 10/28/2013   Procedure: DECOMPRESSION L4-5;  Surgeon: Melina Schools, MD;  Location: [facility];  Service: Orthopedics;  Laterality: N/A;   KNEE ARTHROPLASTY Right 12/24/2018   Procedure: COMPUTER ASSISTED TOTAL KNEE ARTHROPLASTY;  Surgeon: Rod Can, MD;  Location: [facility];  Service: Orthopedics;  Laterality: Right;   KNEE ARTHROPLASTY Left 09/17/2019   Procedure: COMPUTER ASSISTED TOTAL KNEE ARTHROPLASTY;  Surgeon: Rod Can, MD;  Location: [facility];  Service: Orthopedics;  Laterality: Left;   KNEE ARTHROSCOPY  12/2008   left knee   KNEE ARTHROSCOPY Right 01/27/2018   PARTIAL HYSTERECTOMY  10/1994   thumb surgery Right 03/1975    Review of Systems  Constitutional:  Negative for chills and fever.  Eyes:  Negative for blurred vision.  Respiratory:  Negative for shortness of breath.   Cardiovascular:  Negative for chest pain.  Gastrointestinal:  Positive for abdominal pain, diarrhea, nausea and vomiting. Negative for blood in stool, constipation, heartburn and melena.  Neurological:  Positive for headaches. Negative for dizziness.      Objective    BP 132/78   Pulse 79   Ht 5' (1.524 m)   Wt 235 lb (106.6 kg)   SpO2 96%   BMI 45.90 kg/m   Physical  Exam Constitutional:      Appearance: She is obese.  HENT:     Head: Normocephalic.  Cardiovascular:     Rate and Rhythm: Normal rate and regular rhythm.     Pulses: Normal pulses.     Heart sounds: Normal heart sounds.  Pulmonary:     Effort: Pulmonary effort is normal.     Breath sounds: Normal breath sounds.  Abdominal:     General: Bowel sounds are normal.     Palpations: Abdomen is soft. There is no mass.     Tenderness: There is no abdominal tenderness. There is no right CVA tenderness, left CVA tenderness or guarding.  Musculoskeletal:        General: Normal range of motion.     Right lower leg: No edema.     Left lower leg: No edema.  Skin:    General: Skin is warm and dry.     Capillary Refill: Capillary refill takes less than 2 seconds.  Neurological:     General: No focal deficit present.     Mental Status: She is alert.     Coordination: Coordination normal.     Gait: Gait normal.       Assessment & Plan:  Diarrhea, unspecified type Assessment & Plan: Advise patient to follow up with GI for management Nutrition Referral placed for further evaluation of patient diet possible causing chronic diarrhea. Advise OTC loperamide and bismuth subsalicylate for diarrhea management Explained patient increase fluid intake 8 glasses of water a day to prevent dehydration. Avoid caffeine and alcohol. Add semisolid and low-fiber foods gradually as bowel movements return to normal. Follow a BRAT diet bananas, rice, applesauce, toast helping firm up stool. Follow-up in unable to keep food/fluid down x 24 hours, dizziness, fevers, worsening or persistent symptoms to present to ED or contact primary care provider. Patient verbalizes understanding regarding plan of care and all questions answered.    Orders: -     Referral to Nutrition and Diabetes Services  Primary hypertension -     CBC with Differential/Platelet -     CMP14+EGFR -     Microalbumin / creatinine urine  ratio  Screening for diabetes mellitus -     Hemoglobin A1c  Screening for lipid disorders -     Lipid panel  Screening for thyroid disorder -     TSH + free T4    No follow-ups on file.   Renard Hamper Ria Comment, FNP

## 2022-08-02 NOTE — Progress Notes (Signed)
Spoke to the patient about the results of her HIDA scan. Her bile ducts appear to be patent, but her gallbladder ejection fraction was slightly decreased at 32%. This could be consistent with a condition called biliary dyskinesia, which is where the gallbladder does not pass bile effectively. The treatment for this would be a cholecystectomy. I told her that a cholecystectomy may help but would not be guaranteed to fix her symptoms. She is still symptomatic at this time and would like to be referred to surgery for consideration of cholecystectomy.  Beth, please place referral to surgery for biliary dyskinesia and consideration of cholecystectomy

## 2022-08-03 LAB — CBC WITH DIFFERENTIAL/PLATELET
Basophils Absolute: 0.1 10*3/uL (ref 0.0–0.2)
Basos: 1 %
EOS (ABSOLUTE): 0.3 10*3/uL (ref 0.0–0.4)
Eos: 4 %
Hematocrit: 36.1 % (ref 34.0–46.6)
Hemoglobin: 10.8 g/dL — ABNORMAL LOW (ref 11.1–15.9)
Immature Grans (Abs): 0 10*3/uL (ref 0.0–0.1)
Immature Granulocytes: 0 %
Lymphocytes Absolute: 2.1 10*3/uL (ref 0.7–3.1)
Lymphs: 29 %
MCH: 22.5 pg — ABNORMAL LOW (ref 26.6–33.0)
MCHC: 29.9 g/dL — ABNORMAL LOW (ref 31.5–35.7)
MCV: 75 fL — ABNORMAL LOW (ref 79–97)
Monocytes Absolute: 0.7 10*3/uL (ref 0.1–0.9)
Monocytes: 9 %
Neutrophils Absolute: 4.1 10*3/uL (ref 1.4–7.0)
Neutrophils: 57 %
Platelets: 392 10*3/uL (ref 150–450)
RBC: 4.8 x10E6/uL (ref 3.77–5.28)
RDW: 15.5 % — ABNORMAL HIGH (ref 11.7–15.4)
WBC: 7.1 10*3/uL (ref 3.4–10.8)

## 2022-08-03 LAB — CMP14+EGFR
ALT: 24 IU/L (ref 0–32)
AST: 30 IU/L (ref 0–40)
Albumin/Globulin Ratio: 1.9 (ref 1.2–2.2)
Albumin: 4 g/dL (ref 3.9–4.9)
Alkaline Phosphatase: 146 IU/L — ABNORMAL HIGH (ref 44–121)
BUN/Creatinine Ratio: 13 (ref 12–28)
BUN: 10 mg/dL (ref 8–27)
Bilirubin Total: 0.5 mg/dL (ref 0.0–1.2)
CO2: 22 mmol/L (ref 20–29)
Calcium: 9.1 mg/dL (ref 8.7–10.3)
Chloride: 103 mmol/L (ref 96–106)
Creatinine, Ser: 0.75 mg/dL (ref 0.57–1.00)
Globulin, Total: 2.1 g/dL (ref 1.5–4.5)
Glucose: 116 mg/dL — ABNORMAL HIGH (ref 70–99)
Potassium: 4.1 mmol/L (ref 3.5–5.2)
Sodium: 141 mmol/L (ref 134–144)
Total Protein: 6.1 g/dL (ref 6.0–8.5)
eGFR: 89 mL/min/{1.73_m2} (ref >59)

## 2022-08-03 LAB — LIPID PANEL
Chol/HDL Ratio: 3 ratio (ref 0.0–4.4)
Cholesterol, Total: 137 mg/dL (ref 100–199)
HDL: 45 mg/dL (ref >39)
LDL Chol Calc (NIH): 74 mg/dL (ref 0–99)
Triglycerides: 99 mg/dL (ref 0–149)
VLDL Cholesterol Cal: 18 mg/dL (ref 5–40)

## 2022-08-03 LAB — TSH+FREE T4
Free T4: 1.44 ng/dL (ref 0.82–1.77)
TSH: 1.2 u[IU]/mL (ref 0.450–4.500)

## 2022-08-03 LAB — HEMOGLOBIN A1C
Est. average glucose Bld gHb Est-mCnc: 126 mg/dL
Hgb A1c MFr Bld: 6 % — ABNORMAL HIGH (ref 4.8–5.6)

## 2022-08-03 LAB — MICROALBUMIN / CREATININE URINE RATIO
Creatinine, Urine: 34.1 mg/dL
Microalb/Creat Ratio: <9 mg/g creat (ref 0–29)
Microalbumin, Urine: <3 ug/mL

## 2022-08-08 ENCOUNTER — Ambulatory Visit: Payer: Medicare Other | Admitting: Internal Medicine

## 2022-08-08 ENCOUNTER — Encounter: Payer: Self-pay | Admitting: Internal Medicine

## 2022-08-08 VITALS — BP 140/80 | HR 80 | Ht 60.0 in | Wt 241.0 lb

## 2022-08-08 DIAGNOSIS — K828 Other specified diseases of gallbladder: Secondary | ICD-10-CM

## 2022-08-08 DIAGNOSIS — R197 Diarrhea, unspecified: Secondary | ICD-10-CM

## 2022-08-08 DIAGNOSIS — R1013 Epigastric pain: Secondary | ICD-10-CM | POA: Diagnosis not present

## 2022-08-08 DIAGNOSIS — R112 Nausea with vomiting, unspecified: Secondary | ICD-10-CM | POA: Diagnosis not present

## 2022-08-08 MED ORDER — ONDANSETRON HCL 4 MG PO TABS: 4.0000 mg | ORAL_TABLET | Freq: Three times a day (TID) | ORAL | 1 refills | Status: DC | PRN

## 2022-08-08 MED ORDER — DICYCLOMINE HCL 10 MG PO CAPS: 10.0000 mg | ORAL_CAPSULE | Freq: Four times a day (QID) | ORAL | 0 refills | Status: DC | PRN

## 2022-08-08 NOTE — Patient Instructions (Addendum)
You have been scheduled for a gastric emptying scan at [facility] on 08/27/22 at 9:00 am. Please arrive at least 30 minutes prior to your appointment for registration. Please make certain not to have anything to eat or drink after midnight the night before your test. Hold all stomach medications (ex: Zofran, phenergan, Reglan) 24 hours prior to your test. If you need to reschedule your appointment, please contact radiology scheduling at [phone]. _____________________________________________________________________ A gastric-emptying study measures how long it takes for food to move through your stomach. There are several ways to measure stomach emptying. In the most common test, you eat food that contains a small amount of radioactive material. A scanner that detects the movement of the radioactive material is placed over your abdomen to monitor the rate at which food leaves your stomach. This test normally takes about 4 hours to complete.  We have sent the following medications to your pharmacy for you to pick up at your convenience: Dicyclomine, Zofran   If your blood pressure at your visit was 140/90 or greater, please contact your primary care physician to follow up on this.  If you are age 63 or older, your body mass index should be between 23-30. Your Body mass index is 47.07 kg/m. If this is out of the aforementioned range listed, please consider follow up with your Primary Care Provider.  If you are age 63 or younger, your body mass index should be between 19-25. Your Body mass index is 47.07 kg/m. If this is out of the aformentioned range listed, please consider follow up with your Primary Care Provider.   ________________________________________________________  The [facility] providers would like to encourage you to use [facility] to communicate with providers for non-urgent requests or questions.  Due to long hold times on the telephone, sending your provider a message by  [facility] may be a faster and more efficient way to get a response.  Please allow 48 business hours for a response.  Please remember that this is for non-urgent requests.  _______________________________________________________   Due to recent changes in healthcare laws, you may see the results of your imaging and laboratory studies on MyChart before your provider has had a chance to review them.  We understand that in some cases there may be results that are confusing or concerning to you. Not all laboratory results come back in the same time frame and the provider may be waiting for multiple results in order to interpret others.  Please give [place] 48 hours in order for your provider to thoroughly review all the results before contacting the office for clarification of your results.    Thank you for entrusting me with your care and for choosing [facility], Dr. Eulah Pont

## 2022-08-08 NOTE — Progress Notes (Signed)
Chief Complaint: N&V, diarrhea  HPI : 62 year old female with history of anxiety, migraines, obesity, colon polyps, and arthritis presents for follow up of N&V  Interval History: Endorses 8-10 BMs per day on average. She is taking Imodium 4 mg QD, which helps a little. She was doing a little better from a nausea standpoint, but then she started throwing up last week again. Still struggles with nausea and does not feel well in general. She feels like she is getting depressed due to her persistent GI symptoms. She does have some cramping in her lower abdomen. Patient has still been following a bland diet for the most part. A low FODMAP diet has not helped in the past.  Wt Readings from Last 3 Encounters:  08/08/22 241 lb (109.3 kg)  08/01/22 235 lb (106.6 kg)  07/11/22 239 lb (108.4 kg)   Current Outpatient Medications  Medication Sig Dispense Refill   atorvastatin (LIPITOR) 20 MG tablet TAKE 1 TABLET BY MOUTH EVERY DAY 90 tablet 3   cetirizine (ZYRTEC) 10 MG tablet Take by mouth.     diltiazem (CARDIZEM CD) 120 MG 24 hr capsule TAKE 1 CAPSULE BY MOUTH EVERY DAY 90 capsule 2   famotidine (PEPCID) 40 MG tablet Take 1 tablet (40 mg total) by mouth daily. 90 tablet 3   hydrochlorothiazide (MICROZIDE) 12.5 MG capsule TAKE 1 CAPSULE BY MOUTH EVERY DAY 30 capsule 1   Multiple Vitamin (MULTIVITAMIN) tablet Take 1 tablet by mouth daily.     NON FORMULARY Diltiazem 120 mg     pantoprazole (PROTONIX) 40 MG tablet Take 1 tablet (40 mg total) by mouth 2 (two) times daily. 90 tablet 3   No current facility-administered medications for this visit.   Facility-Administered Medications Ordered in Other Visits  Medication Dose Route Frequency Provider Last Rate Last Admin   gadopentetate dimeglumine (MAGNEVIST) injection 20 mL  20 mL Intravenous Once PRN Sater, Pearletha Furl, MD       Physical Exam: BP (!) 140/80   Pulse 80   Ht 5' (1.524 m)   Wt 241 lb (109.3 kg)   BMI 47.07 kg/m  Constitutional:  Pleasant,well-developed, female in no acute distress. HEENT: Normocephalic and atraumatic. Conjunctivae are normal. No scleral icterus. Cardiovascular: Normal rate Pulmonary/chest: Effort normal Abdominal: Soft, nondistended, diffuse mild tenderness to palpation Extremities: No edema Neurological: Alert and oriented to person place and time. Skin: Skin is warm and dry. No rashes noted. Psychiatric: Normal mood and affect. Behavior is normal.  Labs 08/2021: CBC, CMP, and lipase unremarkable.  Labs 02/2022: CRP nml. TTG IGA negative. IgA nml.   Labs 06/2022: Pancreatic fecal elastase nml. GI profile negative.  Labs 07/2022: CBC with low Hb of 10.8. CMP with elevated alk phos of 146. HbA1C elevated at 6%. TSH nml.   Ab U/S 08/05/13: IMPRESSION:  1. There is no acute hepatobiliary abnormality. Evaluation of the pancreas is limited.  2. There is likely a cyst measuring up to 3.7 cm in the mid upper pole of the left kidney.   RUQ U/S 03/27/22: IMPRESSION: 1. Markedly increased hepatic parenchymal echogenicity suggestive of steatosis. 2. No cholelithiasis or sonographic evidence for acute cholecystitis.  HIDA scan 07/30/22: IMPRESSION: Normal hepatobiliary scan, demonstrating patency of both the cystic and common bile ducts. Borderline decreased gallbladder ejection fraction of 32%.  Colonoscopy 10/20/14: Adequate prep. 6 mm sessile polyp in the ascending colon resected with cold snare.  Path: Sessile serrated adenoma  Colonoscopy 12/28/19: Adequate prep. Normal colon. Repeat colonoscopy  recommended in 5 years for surveillance.  EGD 05/03/22:  Path: 1. Surgical [P], duodenal - DUODENAL MUCOSA WITH FOCALLY ACTIVE NONSPECIFIC DUODENITIS - NEGATIVE FOR INCREASED INTRAEPITHELIAL LYMPHOCYTES OR VILLOUS ARCHITECTURAL CHANGES 2. Surgical [P], gastric - GASTRIC ANTRAL MUCOSA WITH NONSPECIFIC REACTIVE GASTROPATHY - GASTRIC OXYNTIC MUCOSA WITH NO SPECIFIC HISTOPATHOLOGIC CHANGES - HELICOBACTER  PYLORI-LIKE ORGANISMS ARE NOT IDENTIFIED ON ROUTINE H&E STAIN 3. Surgical [P], esophageal - ESOPHAGEAL SQUAMOUS MUCOSA WITH NO SPECIFIC HISTOPATHOLOGIC CHANGES - NEGATIVE FOR INCREASED INTRAEPITHELIAL EOSINOPHILS  EGD 07/11/22:  Path: 1. Surgical [P], duodenal - BENIGN SMALL BOWEL MUCOSA WITH NO SIGNIFICANT PATHOLOGIC CHANGES 2. Surgical [P], gastric - GASTRIC ANTRAL AND OXYNTIC MUCOSA WITH FEATURES OF REACTIVE GASTROPATHY - NEGATIVE FOR H. PYLORI ON H&E STAIN - NEGATIVE FOR INTESTINAL METAPLASIA OR MALIGNANCY 3. Surgical [P], esophageal - SQUAMOUS MUCOSA WITH NO SPECIFIC PATHOLOGIC CHANGES - NEGATIVE FOR INCREASED INTRAEPITHELIAL EOSINOPHILS - NEGATIVE FOR DYSPLASIA OR MALIGNANCY  ASSESSMENT AND PLAN: N&V Diffuse ab pain Diarrhea Biliary dyskinesia History of colon polyps Hiatal hernia Patient presents persistent N&V, diarrhea, and diffuse ab pain. Unclear etiology at this time. She has been noted to have a gastric ulcer on EGD in the past, which healed on follow up EGD. However, the patient's symptoms have still persisted. Recent HIDA scan did show a borderline low gallbladder ejection fraction, which could be concerning for biliary dyskinesia. I have referred her to surgery for consideration of cholecystectomy to see if this will help with her symptoms. In the meantime will try to manage her symptom slightly better by prescribing her Zofran and dicyclomine. Will try to get a gastric emptying study to rule out gastroparesis as a contributor (though this would not cause her diarrhea). I did offer the patient a colonoscopy to rule out microscopic colitis as a contributor to her diarrhea, but patient declined for now.  - Previously gave low FODMAP diet handout - Cont PPI BID for now - Continue Imodium 4 mg QD - Start Zofran disintegrating tablet 4 mg TID PRN - Start dicyclomine 10 mg TID PRN for cramping - Plan for gastric emptying study - Previously placed referral to surgery for  consideration of cholecystectomy for biliary dyskinesia. Has appt 4/24 with Dr. Freida Busman - Offered colonoscopy to rule out microscopic colitis but patient would like to see surgery first before deciding on colonoscopy - RTC 2 months  Eulah Pont, MD  I spent 58 minutes of time, including in depth chart review, independent review of results as outlined above, communicating results with the patient directly, face-to-face time with the patient, coordinating care, ordering studies and medications as appropriate, and documentation.

## 2022-08-17 ENCOUNTER — Encounter: Payer: Self-pay | Admitting: Internal Medicine

## 2022-08-21 ENCOUNTER — Encounter: Payer: Self-pay | Admitting: Family Medicine

## 2022-08-21 ENCOUNTER — Other Ambulatory Visit: Payer: Self-pay

## 2022-08-21 MED ORDER — ATORVASTATIN CALCIUM 20 MG PO TABS: 20.0000 mg | ORAL_TABLET | Freq: Every day | ORAL | 3 refills | Status: DC

## 2022-08-22 ENCOUNTER — Telehealth: Payer: Self-pay | Admitting: Internal Medicine

## 2022-08-22 NOTE — Telephone Encounter (Signed)
PT saw surgeon today about gallbladder and they advised they can't do anything for her. She wants to know her next steps. Please advise

## 2022-08-23 NOTE — Telephone Encounter (Signed)
See consult note from [facility]. Patient is scheduled for the GES on 08/27/22. Do you want to schedule her for a colonoscopy. Her office visit with you is in July.

## 2022-08-23 NOTE — Telephone Encounter (Signed)
Patient is calling to  find out is the colonoscopy the next steps for her. Requesting call back

## 2022-08-24 NOTE — Telephone Encounter (Signed)
Spoke with the patient. She says she cannot afford a colonoscopy at this time. She will get the GES. After that she will "have to decide what I can do."

## 2022-08-27 ENCOUNTER — Encounter (HOSPITAL_COMMUNITY)
Admission: RE | Admit: 2022-08-27 | Discharge: 2022-08-27 | Disposition: A | Discharge status: Home / Self Care | Payer: Medicare Other | Source: Ambulatory Visit | Attending: Internal Medicine | Admitting: Internal Medicine

## 2022-08-27 DIAGNOSIS — R197 Diarrhea, unspecified: Secondary | ICD-10-CM | POA: Insufficient documentation

## 2022-08-27 DIAGNOSIS — R112 Nausea with vomiting, unspecified: Secondary | ICD-10-CM | POA: Diagnosis present

## 2022-08-27 DIAGNOSIS — K828 Other specified diseases of gallbladder: Secondary | ICD-10-CM | POA: Insufficient documentation

## 2022-08-27 DIAGNOSIS — R1013 Epigastric pain: Secondary | ICD-10-CM

## 2022-08-27 MED ORDER — TECHNETIUM TC 99M SULFUR COLLOID
2.0000 | Freq: Once | INTRAVENOUS | Status: AC | PRN
  Administered 2022-08-27: 2 via ORAL

## 2022-09-12 ENCOUNTER — Encounter: Payer: Self-pay | Admitting: Internal Medicine

## 2022-09-17 ENCOUNTER — Encounter: Payer: Self-pay | Admitting: Family Medicine

## 2022-09-17 ENCOUNTER — Ambulatory Visit: Payer: Medicare Other | Admitting: Family Medicine

## 2022-09-17 ENCOUNTER — Encounter (INDEPENDENT_AMBULATORY_CARE_PROVIDER_SITE_OTHER): Payer: Self-pay | Admitting: *Deleted

## 2022-09-17 VITALS — BP 148/88 | HR 83 | Ht 60.0 in | Wt 232.0 lb

## 2022-09-17 DIAGNOSIS — R197 Diarrhea, unspecified: Secondary | ICD-10-CM | POA: Diagnosis not present

## 2022-09-17 DIAGNOSIS — L509 Urticaria, unspecified: Secondary | ICD-10-CM | POA: Diagnosis not present

## 2022-09-17 MED ORDER — PREDNISONE 20 MG PO TABS
20.0000 mg | ORAL_TABLET | Freq: Two times a day (BID) | ORAL | 0 refills | Status: AC
Start: 2022-09-17 — End: 2022-09-22

## 2022-09-17 MED ORDER — TRIAMCINOLONE ACETONIDE 0.5 % EX OINT
1.0000 | TOPICAL_OINTMENT | Freq: Two times a day (BID) | CUTANEOUS | 0 refills | Status: AC
Start: 2022-09-17 — End: ?

## 2022-09-17 NOTE — Patient Instructions (Addendum)
        Great to see you today.   - Please take medications as prescribed. - Follow up with your primary health provider if any health concerns arises. - If symptoms worsen please contact your primary care provider and/or visit the emergency department.  

## 2022-09-17 NOTE — Progress Notes (Signed)
Patient Office Visit   Subjective   Patient ID: [name] [name], female    DOB: 1960/06/21  Age: 62 y.o. MRN: [id number]  CC:  Chief Complaint  Patient presents with   Rash    Patient is here for hives. States she has had them for a week and are from anxiety.     HPI [name] [name] 62 year old female, presents to the clinic with new onset of Hives started on Monday. She  has a past medical history of Anxiety (2008), Arthritis, Colon polyps, History of kidney stones (1999), History of migraine, History of stress test (2010), Hives of unknown origin, Hypertension, Kidney stones, Neuromuscular disorder (HCC), Obesity, unspecified, Sleep difficulties, and Syncope.  Urticaria This is a new problem. Patient reported hives has been gradually worsening since onset. The rash is diffuse. The rash is characterized by itchiness, redness and bruising. It is unknown if there was an exposure to a precipitant. Associated symptoms include congestion. Pertinent negatives include no cough, diarrhea, fatigue, shortness of breath, sore throat or vomiting. Past treatments include benadryl and zyrtec. The treatment provided no relief. Her past medical history is significant for allergies.        Outpatient Encounter Medications as of 09/17/2022  Medication Sig   atorvastatin (LIPITOR) 20 MG tablet Take 1 tablet (20 mg total) by mouth daily.   cetirizine (ZYRTEC) 10 MG tablet Take by mouth.   dicyclomine (BENTYL) 10 MG capsule Take 1 capsule (10 mg total) by mouth 4 (four) times daily as needed for spasms.   diltiazem (CARDIZEM CD) 120 MG 24 hr capsule TAKE 1 CAPSULE BY MOUTH EVERY DAY   famotidine (PEPCID) 40 MG tablet Take 1 tablet (40 mg total) by mouth daily.   hydrochlorothiazide (MICROZIDE) 12.5 MG capsule TAKE 1 CAPSULE BY MOUTH EVERY DAY   Multiple Vitamin (MULTIVITAMIN) tablet Take 1 tablet by mouth daily.   NON FORMULARY Diltiazem 120 mg   pantoprazole (PROTONIX) 40 MG tablet Take 1 tablet (40  mg total) by mouth 2 (two) times daily.   predniSONE (DELTASONE) 20 MG tablet Take 1 tablet (20 mg total) by mouth 2 (two) times daily with a meal for 5 days.   triamcinolone ointment (KENALOG) 0.5 % Apply 1 Application topically 2 (two) times daily.   ondansetron (ZOFRAN) 4 MG tablet Take 1 tablet (4 mg total) by mouth 3 (three) times daily as needed for nausea or vomiting. (Patient not taking: Reported on 09/17/2022)   Facility-Administered Encounter Medications as of 09/17/2022  Medication   gadopentetate dimeglumine (MAGNEVIST) injection 20 mL    Past Surgical History:  Procedure Laterality Date   COLONOSCOPY  2016   DECOMPRESSIVE LUMBAR LAMINECTOMY LEVEL 1 N/A 10/28/2013   Procedure: DECOMPRESSION L4-5;  Surgeon: Venita Lick, MD;  Location: [facility] OR;  Service: Orthopedics;  Laterality: N/A;   KNEE ARTHROPLASTY Right 12/24/2018   Procedure: COMPUTER ASSISTED TOTAL KNEE ARTHROPLASTY;  Surgeon: Samson Frederic, MD;  Location: [facility] ORS;  Service: Orthopedics;  Laterality: Right;   KNEE ARTHROPLASTY Left 09/17/2019   Procedure: COMPUTER ASSISTED TOTAL KNEE ARTHROPLASTY;  Surgeon: Samson Frederic, MD;  Location: [facility];  Service: Orthopedics;  Laterality: Left;   KNEE ARTHROSCOPY  12/2008   left knee   KNEE ARTHROSCOPY Right 01/27/2018   PARTIAL HYSTERECTOMY  10/1994   thumb surgery Right 03/1975    Review of Systems  Constitutional:  Negative for chills and fever.  Respiratory:  Negative for shortness of breath.   Cardiovascular:  Negative for chest  pain.  Gastrointestinal:  Negative for vomiting.  Skin:  Positive for itching and rash.  Neurological:  Negative for dizziness and headaches.      Objective    BP (!) 148/88   Pulse 83   Ht 5' (1.524 m)   Wt 232 lb (105.2 kg)   SpO2 95%   BMI 45.31 kg/m   Physical Exam Vitals reviewed.  Constitutional:      General: She is not in acute distress.    Appearance: Normal appearance. She is not ill-appearing, toxic-appearing or  diaphoretic.  HENT:     Head: Normocephalic.  Eyes:     General:        Right eye: No discharge.        Left eye: No discharge.     Conjunctiva/sclera: Conjunctivae normal.  Cardiovascular:     Pulses: Normal pulses.     Heart sounds: Normal heart sounds.  Pulmonary:     Effort: Pulmonary effort is normal. No respiratory distress.     Breath sounds: Normal breath sounds.  Abdominal:     General: Bowel sounds are normal.     Palpations: Abdomen is soft.     Tenderness: There is no abdominal tenderness. There is no right CVA tenderness, left CVA tenderness or guarding.  Musculoskeletal:        General: Normal range of motion.     Cervical back: Normal range of motion.  Skin:    General: Skin is warm and dry.     Capillary Refill: Capillary refill takes less than 2 seconds.  Neurological:     General: No focal deficit present.     Mental Status: She is alert and oriented to person, place, and time.     Coordination: Coordination normal.     Gait: Gait normal.  Psychiatric:        Mood and Affect: Mood normal.        Behavior: Behavior normal.       Assessment & Plan:  Diarrhea, unspecified type -     Ambulatory referral to Gastroenterology  Hives Assessment & Plan: Hives due to unknown source Advise patient new alarming symptoms of shortness of breath, angioedema, syncope, dizziness visit ED. Started prednisone 20 mg x 5 days and Kenalog ointment Referral placed to Allergist for further evaluation   Orders: -     Ambulatory referral to Allergy -     predniSONE; Take 1 tablet (20 mg total) by mouth 2 (two) times daily with a meal for 5 days.  Dispense: 10 tablet; Refill: 0 -     Triamcinolone Acetonide; Apply 1 Application topically 2 (two) times daily.  Dispense: 30 g; Refill: 0    Return in about 4 months (around 01/18/2023) for chronic follow-up, routine labs.   Cruzita Lederer Newman Nip, FNP

## 2022-09-17 NOTE — Assessment & Plan Note (Signed)
Hives due to unknown source Advise patient new alarming symptoms of shortness of breath, angioedema, syncope, dizziness visit ED. Started prednisone 20 mg x 5 days and Kenalog ointment Referral placed to Allergist for further evaluation

## 2022-09-18 ENCOUNTER — Encounter (INDEPENDENT_AMBULATORY_CARE_PROVIDER_SITE_OTHER): Payer: Self-pay

## 2022-09-20 ENCOUNTER — Encounter: Payer: Self-pay | Admitting: Family Medicine

## 2022-09-21 ENCOUNTER — Other Ambulatory Visit: Payer: Self-pay | Admitting: Family Medicine

## 2022-09-25 NOTE — Telephone Encounter (Signed)
Patient called is there any thing she can do for these hives til 06.17.2024 patient call back # [phone].

## 2022-09-26 ENCOUNTER — Ambulatory Visit: Payer: Medicare Other | Admitting: Internal Medicine

## 2022-10-01 ENCOUNTER — Encounter: Payer: Self-pay | Admitting: Gastroenterology

## 2022-10-01 ENCOUNTER — Ambulatory Visit: Payer: Medicare Other | Admitting: Gastroenterology

## 2022-10-01 VITALS — BP 132/79 | HR 76 | Temp 97.5°F | Ht 60.0 in | Wt 232.2 lb

## 2022-10-01 DIAGNOSIS — R1013 Epigastric pain: Secondary | ICD-10-CM

## 2022-10-01 DIAGNOSIS — K828 Other specified diseases of gallbladder: Secondary | ICD-10-CM | POA: Diagnosis not present

## 2022-10-01 DIAGNOSIS — R197 Diarrhea, unspecified: Secondary | ICD-10-CM | POA: Diagnosis not present

## 2022-10-01 DIAGNOSIS — R112 Nausea with vomiting, unspecified: Secondary | ICD-10-CM

## 2022-10-01 DIAGNOSIS — K219 Gastro-esophageal reflux disease without esophagitis: Secondary | ICD-10-CM | POA: Diagnosis not present

## 2022-10-01 MED ORDER — OMEPRAZOLE 40 MG PO CPDR: 40.0000 mg | DELAYED_RELEASE_CAPSULE | Freq: Every day | ORAL | 2 refills | Status: DC

## 2022-10-01 MED ORDER — ESOMEPRAZOLE MAGNESIUM 40 MG PO CPDR: 40.0000 mg | DELAYED_RELEASE_CAPSULE | Freq: Every day | ORAL | 1 refills | Status: DC

## 2022-10-01 NOTE — Patient Instructions (Signed)
Stop pantoprazole.  Start Nexium 40 mg twice daily, 30 minutes prior to breakfast and dinner.  You may continue Pepcid 40 mg once daily as needed for breakthrough or Tums for severe symptoms.  Continue to follow a blander diet.  Specifically maintain low-fat diet.  Continue dicyclomine 10 mg 3-4 times daily as needed to help control diarrhea.  If you are experiencing some menstrual type cramps with this dicyclomine you may take it 3 times a day instead of 4 times a day.  If your diarrhea worsens with this you may use Imodium needed for breakthrough.  As we discussed in the office today, I will reach out to general surgery here in [place] to see if they are willing to see you regarding your gallbladder.  Please reach out to me via MyChart and let me know how you are doing in 2 weeks with taking the Nexium and how your diarrhea is going.  Will plan to follow-up in the office in about 8 weeks.  It was a pleasure to see you today. I want to create trusting relationships with patients. If you receive a survey regarding your visit,  I greatly appreciate you taking time to fill this out on paper or through your MyChart. I value your feedback.  Brooke Bonito, MSN, FNP-BC, AGACNP-BC [facility]

## 2022-10-01 NOTE — Progress Notes (Addendum)
GI Office Note    Referring Provider: Wylene Men* Primary Care Physician:  Rica Records, FNP  Primary Gastroenterologist: Dolores Frame, MD  Chief Complaint   Chief Complaint  Patient presents with   Diarrhea    Patient here today due to issues with diarrhea, nausea, and vomiting, indigestion and heartburn. Patient is taking pepcid 40 mg one to two per day. She is also taking pantoprazole 40 mg bid. She is also is taking the Bentyl 10 mg up to QID. Also takes ondansetron prn Symptoms ongoing since fall of the year 2023.    History of Present Illness   [name] [name] is a 61 y.o. female presenting today at the request of Del York Pellant* for diarrhea. Previously followed by Dr. Leonides Schanz at [facility] but requested transfer to [facility].   Colonoscopy 10/20/14:  -Adequate prep. 6 mm sessile polyp in the ascending colon resected with cold snare.  -Path: Sessile serrated adenoma   Colonoscopy 12/28/19:  -Adequate prep.  -Normal colon.  -Repeat colonoscopy recommended in 5 years for surveillance.  Labs 02/2022: CRP nml. TTG IGA negative. IgA nml.    EGD 05/03/2022: -Benign-appearing esophageal stenosis -Hiatal hernia -Gastric fluid -Nonbleeding gastric ulcer with clean ulcer base s/p biopsy -Normal duodenum s/p biopsy -Duodenal biopsy with nonspecific duodenitis, reactive gastropathy, negative for H. pylori, negative esophageal biopsy.  Labs 06/2022: Pancreatic fecal elastase nml. GI profile negative.  Last visit with Dr. Leonides Schanz 08/08/2022.  Patient reported 8-10 bowel movements per day on average, taking Imodium 4 mg every day which helps a little.  Also with some chronic nausea that was initially improved but was having some vomiting again a week prior to this visit.  Overall does not feel well at baseline.  Feels depressed given her GI symptoms.  Some cramping in her lower abdomen as well.  Low FODMAP diet has not helped in the past.  Still  following a bland diet.  Referred to general surgery for consideration of cholecystectomy to see if this would help with symptoms.  She was given Zofran and dicyclomine to help with nausea and abdominal cramping.  Advised GES to evaluate her nausea.  Colonoscopy offered to patient to rule out microscopic colitis however patient declined.  Advised to continue PPI twice daily.  Advised follow-up in 2 months.  EGD 07/11/2022: -Normal esophagus s/p biopsy -Small lateral hernia -Erythematous mucosa in the incisura s/p biopsy -Normal duodenum s/p biopsy -Pathology revealed benign small bowel mucosa, reactive gastropathy in the stomach, negative for H. pylori.  No evidence of EOE.  HIDA scan 07/30/22: -Normal hepatobiliary scan with patent cystic and common bile ducts -Borderline decreased gallbladder EF of 32%  Labs 07/2022: CBC with low Hb of 10.8. CMP with elevated alk phos of 146. HbA1C elevated at 6%. TSH nml.   Patient reported 08/22/2022 that she saw surgeon about her gallbladder and they told her they could not do anything.  She was advised that she did not get scheduled for colonoscopy however then she states that she cannot afford it.  On 5/15 she requested a second opinion.  GES 08/27/2022: Normal gastric emptying study, >85% emptying at 3 hours.  Today:  Symptoms started in September last year. Was given 3 different medication to help with her nerves. Has had gallbladder evaluated as well. Had gastric ulcer treated and then follow up EGD. Had bad experience with [place] [facility] as they did not listen to her regarding her symptoms. Did not hear anything  back from anyone in timely manner regarding hre GES - this was frustrating to her. Did not feel listened to by Dr. Leonides Schanz.   Has chronic hives. At times this comes from not knowing if she is going to poop or be nausea/vomiting. Sister states this happens in times of high stress for the patient.   Nausea/vomiting-taking Zofran as  needed. Has been getting dehydrated frequently. Has seen PCP and all of her lab work have been out of balance.   GERD -taking pantoprazole 40 mg twice daily as well as Pepcid 40 mg 1-2 times daily. Since being on dicyclomine the heartburn is worse. Has had symptoms with same foods and do the same thing the next day and still have symptoms and was keeping a log. Has a poor appetite. Does not eat spicy foods. Has stayed away from fired foods and has cut down on the amounts of foods. Tastes foods at time after meals. Mid chest area feels like a fireball is there and going down her chest and has only had coffee this morning. Did have a lot of BM this morning. Has only been able to ear about 1 meal per day. Does not eat late at night and does not lay down quickly after eating.   Diarrhea -taking Bentyl 10 mg up to 4 times daily. While on dicyclomine she is having 4-5 BM daily and starts out soft but after the third it is runny. This has helped some. Not enough data to perform colonoscopy to be covered by insurance and will be a large out of pocket cost. Has tried pepto and imodium. Has depends if she needs it.  Previously she would barely be able to go get groceries due to so much diarrhea. Prior to dicyclomine she was going 8-12 per day. A normal morning starts out with a BM but it does hit her at different times. And it is very urgent. Has cut out dairy.   Used to be constipated and usedto take chronic opioids. Once she got off pain medication the beginning of 2022 she could go to the bathroom every morning like clockwork. At that point she never needed to take anything to help go. Normal was once daily soft BM prior to september.   Hysterectomy in the 1990s. Feels like she has menstrual cramps  but is dealing with the gas and burping as well as the heartburn.   Unable to keep water, oatmeal or toast down at times.   Current Outpatient Medications  Medication Sig Dispense Refill   atorvastatin (LIPITOR)  20 MG tablet Take 1 tablet (20 mg total) by mouth daily. 90 tablet 3   cetirizine (ZYRTEC) 10 MG tablet Take 10 mg by mouth 2 (two) times daily.     dicyclomine (BENTYL) 10 MG capsule Take 1 capsule (10 mg total) by mouth 4 (four) times daily as needed for spasms. 90 capsule 0   diltiazem (CARDIZEM CD) 120 MG 24 hr capsule TAKE 1 CAPSULE BY MOUTH EVERY DAY 90 capsule 2   famotidine (PEPCID) 40 MG tablet Take 1 tablet (40 mg total) by mouth daily. 90 tablet 3   hydrochlorothiazide (MICROZIDE) 12.5 MG capsule TAKE 1 CAPSULE BY MOUTH EVERY DAY 30 capsule 1   ondansetron (ZOFRAN) 4 MG tablet Take 1 tablet (4 mg total) by mouth 3 (three) times daily as needed for nausea or vomiting. 30 tablet 1   pantoprazole (PROTONIX) 40 MG tablet Take 1 tablet (40 mg total) by mouth 2 (two) times daily. 90  tablet 3   triamcinolone ointment (KENALOG) 0.5 % Apply 1 Application topically 2 (two) times daily. 30 g 0   No current facility-administered medications for this visit.   Facility-Administered Medications Ordered in Other Visits  Medication Dose Route Frequency Provider Last Rate Last Admin   gadopentetate dimeglumine (MAGNEVIST) injection 20 mL  20 mL Intravenous Once PRN Asa Lente, MD        Past Medical History:  Diagnosis Date   Anxiety 2008   seen in ED /w chest pain but told that she was having anxiety   Arthritis    Colon polyps    History of kidney stones 1999   History of migraine    History of stress test 2010   done due to chest pain /w Dr. Jacinto Halim. Pt. told that it was wnl.   Hives of unknown origin    told by PCP Terri Piedra) to use Zrytec BID to control hives    Hypertension    Kidney stones    Neuromuscular disorder (HCC)    back pain   Obesity, unspecified    Sleep difficulties    has had a sleep study - t old that she didn't meet criteria for machine    Syncope    due to pain    Past Surgical History:  Procedure Laterality Date   COLONOSCOPY  2016   DECOMPRESSIVE LUMBAR  LAMINECTOMY LEVEL 1 N/A 10/28/2013   Procedure: DECOMPRESSION L4-5;  Surgeon: Venita Lick, MD;  Location: [facility] OR;  Service: Orthopedics;  Laterality: N/A;   KNEE ARTHROPLASTY Right 12/24/2018   Procedure: COMPUTER ASSISTED TOTAL KNEE ARTHROPLASTY;  Surgeon: Samson Frederic, MD;  Location: [facility];  Service: Orthopedics;  Laterality: Right;   KNEE ARTHROPLASTY Left 09/17/2019   Procedure: COMPUTER ASSISTED TOTAL KNEE ARTHROPLASTY;  Surgeon: Samson Frederic, MD;  Location: [facility];  Service: Orthopedics;  Laterality: Left;   KNEE ARTHROSCOPY  12/2008   left knee   KNEE ARTHROSCOPY Right 01/27/2018   PARTIAL HYSTERECTOMY  10/1994   thumb surgery Right 03/1975    Family History  Problem Relation Age of Onset   Stroke Father    Diabetes Other    Hyperlipidemia Other    Heart disease Other    Hypertension Other    Cancer Other    Breast cancer Neg Hx    Colon cancer Neg Hx    Esophageal cancer Neg Hx    Stomach cancer Neg Hx    Rectal cancer Neg Hx     Allergies as of 10/01/2022 - Review Complete 10/01/2022  Allergen Reaction Noted   Gabapentin  07/28/2015   Other  12/19/2018   Penicillin g  11/13/2019   Penicillins Hives and Rash 07/10/2013    Social History   Socioeconomic History   Marital status: Single    Spouse name: Not on file   Number of children: 0   Years of education: Not on file   Highest education level: Bachelor's degree (e.g., BA, AB, BS)  Occupational History   Occupation: Radiographer, therapeutic  Tobacco Use   Smoking status: Never   Smokeless tobacco: Never  Vaping Use   Vaping Use: Never used  Substance and Sexual Activity   Alcohol use: Yes    Comment: rare   Drug use: No   Sexual activity: Not on file  Other Topics Concern   Not on file  Social History Narrative   Not on file   Social Determinants of Health   Financial Resource Strain: Low  Risk  (09/13/2022)   Overall Financial Resource Strain (CARDIA)    Difficulty of Paying Living Expenses:  Not very hard  Food Insecurity: No Food Insecurity (09/13/2022)   Hunger Vital Sign    Worried About Running Out of Food in the Last Year: Never true    Ran Out of Food in the Last Year: Never true  Transportation Needs: No Transportation Needs (09/13/2022)   PRAPARE - Administrator, Civil Service (Medical): No    Lack of Transportation (Non-Medical): No  Physical Activity: Unknown (09/13/2022)   Exercise Vital Sign    Days of Exercise per Week: 0 days    Minutes of Exercise per Session: Not on file  Stress: Stress Concern Present (09/13/2022)   [employer] of Occupational Health - Occupational Stress Questionnaire    Feeling of Stress : To some extent  Social Connections: Socially Isolated (09/13/2022)   Social Connection and Isolation Panel [NHANES]    Frequency of Communication with Friends and Family: More than three times a week    Frequency of Social Gatherings with Friends and Family: Not on file    Attends Religious Services: Never    Database administrator or Organizations: No    Attends Engineer, structural: Not on file    Marital Status: Never married  Intimate Partner Violence: Not on file     Review of Systems   Gen: Denies any fever, chills, fatigue, weight loss, lack of appetite.  CV: Denies chest pain, heart palpitations, peripheral edema, syncope.  Resp: Denies shortness of breath at rest or with exertion. Denies wheezing or cough.  GI: see HPI GU : Denies urinary burning, urinary frequency, urinary hesitancy MS: Denies joint pain, muscle weakness, cramps, or limitation of movement.  Derm: Denies rash, itching, dry skin Psych: Denies depression, anxiety, memory loss, and confusion Heme: Denies bruising, bleeding, and enlarged lymph nodes.   Physical Exam   BP 132/79 (BP Location: Right Arm, Patient Position: Sitting, Cuff Size: Large)   Pulse 76   Temp (!) 97.5 F (36.4 C) (Temporal)   Ht 5' (1.524 m)   Wt 232 lb 3.2 oz (105.3 kg)    BMI 45.35 kg/m   General:   Alert and oriented. Pleasant and cooperative. Well-nourished and well-developed.  Head:  Normocephalic and atraumatic. Eyes:  Without icterus, sclera clear and conjunctiva pink.  Ears:  Normal auditory acuity. Mouth:  No deformity or lesions, oral mucosa pink.  Lungs:  Clear to auscultation bilaterally. No wheezes, rales, or rhonchi. No distress.  Heart:  S1, S2 present without murmurs appreciated.  Abdomen:  +BS, soft, non-tender and non-distended. No HSM noted. No guarding or rebound. No masses appreciated.  Rectal:  Deferred  Msk:  Symmetrical without gross deformities. Normal posture. Extremities:  Without edema. Neurologic:  Alert and  oriented x4;  grossly normal neurologically. Skin:  Intact without significant lesions or rashes. Psych:  Alert and cooperative. Normal mood and affect.   Assessment   [name] [name] is a 62 y.o. female with a history of anxiety, migraines, obesity, colon polyps, arthritis presenting today to establish care with complaint of diarrhea.   Diarrhea: Suspect IBS. Prior workup with negative GI profile, negative celiac serologies.  [place] thyroid. Normal fecal elastase.  Decreased gallbladder EF on recent HIDA scan in April.  Surgery suspects that her symptoms are more functional in nature and if she had negative colonoscopy as well as gastric emptying study that they would consider discussing cholecystectomy with  her again with the understanding that it may not improve all of her symptoms. Given her gallbladder dysfunction Viberzi may possibly be contraindicated therefore will trial cholestyramine first and if no improvement will trial Xifaxin. She will trial change in PPI first given that dicyclomine is currently helping with diarrhea some (less frequency). She is unable to afford diagnostic colonoscopy at this point. Will discuss potential consult with Dr. Henreitta Leber for cholecystectomy prior to sending formal referral. Could  consider trial of Xifaxin or Elavil for symptoms as well.   Nausea/Vomiting, GERD: Recent normal GES. HIDA scan in April with borderline decreased gallbladder EF of 32%. Taking Zofran as needed for nausea. At times with difficulty keeping food down and staying hydrated. Currently on PPI BID and Pepcid 1-2 times daily and has poor appetite. Trying to follow bland diet and recently only able to eat one meal per day. No NSAIDs, alcohol, or tobacco use. Interestingly also develops hives in times of stress and with significant N/V. Given reflux is her biggest complain currently will change pantoprazole to nexium and continue pepcid 40 mg as needed for breakthrough. Advised to maintain low fat diet and may use tums for breakthrough reflux symptoms.   PLAN   Could consider cholestyramine - could consider Xifaxin if not helpful.  (Take 2 hours before or after other medications.  Continue imodium as needed Continue dicyclomine 3-4 times daily as needed Stop pantoprazole and start Nexium 40 mg twice daily.  Pepcid 40 mg once daily as needed.  Consider referral to local general surgery - discuss with Dr. Henreitta Leber Follow up in 8 weeks   Brooke Bonito, MSN, FNP-BC, AGACNP-BC [facility]  I have reviewed the note and agree with the APP's assessment as described in this progress note  Will try another PPI at this moment but most likely next step is to try Xifaxan for 2 weeks for possible IBS-D.  Unfortunately she cannot afford a colonoscopy to obtain random colonic biopsies which will help rule out microscopic colitis.  If all this fails, can try cholestyramine trial.  Unclear if she indeed needs a cholecystectomy as she is not presenting classical symptoms for biliary colic.  Will defer this to general surgery but HIDA scan findings seem to less likely explain her symptoms.  Katrinka Blazing, MD Gastroenterology and Hepatology [facility]

## 2022-10-11 ENCOUNTER — Encounter: Payer: Self-pay | Admitting: Gastroenterology

## 2022-10-11 ENCOUNTER — Telehealth: Payer: Self-pay | Admitting: *Deleted

## 2022-10-11 MED ORDER — RIFAXIMIN 550 MG PO TABS: 550.0000 mg | ORAL_TABLET | Freq: Three times a day (TID) | ORAL | 0 refills | Status: DC

## 2022-10-11 NOTE — Telephone Encounter (Signed)
Noted  

## 2022-10-11 NOTE — Telephone Encounter (Signed)
Pt called and sent a MyChart message, stating her stomach is is hurting and she is having more loose stools than before. She would like some medication sent to the pharmacy to help with the loose stools.

## 2022-10-15 ENCOUNTER — Ambulatory Visit: Payer: Medicare Other | Admitting: Internal Medicine

## 2022-10-15 ENCOUNTER — Encounter: Payer: Self-pay | Admitting: Internal Medicine

## 2022-10-15 ENCOUNTER — Other Ambulatory Visit: Payer: Self-pay

## 2022-10-15 VITALS — BP 122/76 | HR 105 | Temp 98.2°F | Resp 18 | Ht 60.5 in | Wt 232.1 lb

## 2022-10-15 DIAGNOSIS — L299 Pruritus, unspecified: Secondary | ICD-10-CM | POA: Diagnosis not present

## 2022-10-15 DIAGNOSIS — L501 Idiopathic urticaria: Secondary | ICD-10-CM | POA: Diagnosis not present

## 2022-10-15 MED ORDER — CETIRIZINE HCL 10 MG PO TABS: 20.0000 mg | ORAL_TABLET | Freq: Two times a day (BID) | ORAL | 1 refills | Status: DC

## 2022-10-15 MED ORDER — FAMOTIDINE 40 MG PO TABS
40.0000 mg | ORAL_TABLET | Freq: Two times a day (BID) | ORAL | 1 refills | Status: DC
Start: 2022-10-15 — End: 2022-11-05

## 2022-10-15 MED ORDER — HYDROXYZINE HCL 10 MG PO TABS: 10.0000 mg | ORAL_TABLET | Freq: Every evening | ORAL | 1 refills | Status: DC

## 2022-10-15 NOTE — Progress Notes (Signed)
NEW PATIENT  Date of Service/Encounter:  10/15/22  Consult requested by: Rica Records, FNP   Subjective:   [name] [name] (DOB: 1960/07/26) is a 62 y.o. female who presents to the clinic on 10/15/2022 with a chief complaint of Urticaria .    History obtained from: chart review and patient.  Chronic Urticaria/Angioedema: Started in Dec 2008.   She has been on multiple anti histamines (Claritin, Allegra, Zyrtec) and allergy tested previously without a clear trigger identified for hives. She was requiring frequent prednisone so was at some point transitioned to cyclosporine through a [facility] trial. Hives were sever from 2008-2014 but afterwards, calmed down and were not flaring up frequently. About 1.5 months ago, started having daily hives again. Given oral prednisone by PCP which does help resolve it but then reoccured with stopping it.  In the past, recalls responding the best to Zyrtec.  Hives are itchy, raised, red. No pain/scarring.  Occurring randomly, no clear triggers.  Does have a lot of stress. Taking Zyrtec 30mg  daily. Never been on Xolair.    Past Medical History: Past Medical History:  Diagnosis Date   Anxiety 2008   seen in ED /w chest pain but told that she was having anxiety   Arthritis    Colon polyps    History of kidney stones 1999   History of migraine    History of stress test 2010   done due to chest pain /w Dr. Jacinto Halim. Pt. told that it was wnl.   Hives of unknown origin    told by PCP Terri Piedra) to use Zrytec BID to control hives    Hypertension    Kidney stones    Neuromuscular disorder (HCC)    back pain   Obesity, unspecified    Sleep difficulties    has had a sleep study - t old that she didn't meet criteria for machine    Syncope    due to pain   Past Surgical History: Past Surgical History:  Procedure Laterality Date   COLONOSCOPY  2016   DECOMPRESSIVE LUMBAR LAMINECTOMY LEVEL 1 N/A 10/28/2013   Procedure: DECOMPRESSION L4-5;   Surgeon: Venita Lick, MD;  Location: [facility] OR;  Service: Orthopedics;  Laterality: N/A;   KNEE ARTHROPLASTY Right 12/24/2018   Procedure: COMPUTER ASSISTED TOTAL KNEE ARTHROPLASTY;  Surgeon: Samson Frederic, MD;  Location: [facility];  Service: Orthopedics;  Laterality: Right;   KNEE ARTHROPLASTY Left 09/17/2019   Procedure: COMPUTER ASSISTED TOTAL KNEE ARTHROPLASTY;  Surgeon: Samson Frederic, MD;  Location: [facility];  Service: Orthopedics;  Laterality: Left;   KNEE ARTHROSCOPY  12/2008   left knee   KNEE ARTHROSCOPY Right 01/27/2018   PARTIAL HYSTERECTOMY  10/1994   thumb surgery Right 03/1975    Family History: Family History  Problem Relation Age of Onset   Stroke Father    Diabetes Other    Hyperlipidemia Other    Heart disease Other    Hypertension Other    Cancer Other    Breast cancer Neg Hx    Colon cancer Neg Hx    Esophageal cancer Neg Hx    Stomach cancer Neg Hx    Rectal cancer Neg Hx    Allergic rhinitis Neg Hx    Angioedema Neg Hx    Asthma Neg Hx    Eczema Neg Hx    Urticaria Neg Hx     Social History:  Lives in a 1987 year house Flooring in bedroom: carpet Pets: dog Tobacco use/exposure:  none Job: retired  Medication List:  Allergies as of 10/15/2022       Reactions   Gabapentin    Dizziness   Other    American cockroaches   Penicillin G    Other reaction(s): hives   Penicillins Hives, Rash   Did it involve swelling of the face/tongue/throat, SOB, or low BP? No Did it involve sudden or severe rash/hives, skin peeling, or any reaction on the inside of your mouth or nose? No Did you need to seek medical attention at a hospital or doctor's office? Yes When did it last happen?      Childhood allergy If all above answers are "NO", may proceed with cephalosporin use.        Medication List        Accurate as of October 15, 2022 12:21 PM. If you have any questions, ask your nurse or doctor.          atorvastatin 20 MG tablet Commonly known as:  LIPITOR Take 1 tablet (20 mg total) by mouth daily.   cetirizine 10 MG tablet Commonly known as: ZYRTEC Take 2 tablets (20 mg total) by mouth 2 (two) times daily. What changed: how much to take Changed by: Birder Robson, MD   dicyclomine 10 MG capsule Commonly known as: BENTYL Take 1 capsule (10 mg total) by mouth 4 (four) times daily as needed for spasms.   diltiazem 120 MG 24 hr capsule Commonly known as: CARDIZEM CD TAKE 1 CAPSULE BY MOUTH EVERY DAY   famotidine 40 MG tablet Commonly known as: Pepcid Take 1 tablet (40 mg total) by mouth 2 (two) times daily. What changed: when to take this Changed by: Birder Robson, MD   hydrochlorothiazide 12.5 MG capsule Commonly known as: MICROZIDE TAKE 1 CAPSULE BY MOUTH EVERY DAY   hydrOXYzine 10 MG tablet Commonly known as: ATARAX Take 1 tablet (10 mg total) by mouth at bedtime. Started by: Birder Robson, MD   omeprazole 40 MG capsule Commonly known as: PRILOSEC Take 1 capsule (40 mg total) by mouth daily.   ondansetron 4 MG tablet Commonly known as: ZOFRAN Take 1 tablet (4 mg total) by mouth 3 (three) times daily as needed for nausea or vomiting.   rifaximin 550 MG Tabs tablet Commonly known as: XIFAXAN Take 1 tablet (550 mg total) by mouth 3 (three) times daily for 14 days.   triamcinolone ointment 0.5 % Commonly known as: KENALOG Apply 1 Application topically 2 (two) times daily.         REVIEW OF SYSTEMS: Pertinent positives and negatives discussed in HPI.   Objective:   Physical Exam: BP 122/76   Pulse (!) 105   Temp 98.2 F (36.8 C)   Resp 18   Ht 5' 0.5" (1.537 m)   Wt 232 lb 2 oz (105.3 kg)   SpO2 95%   BMI 44.59 kg/m  Body mass index is 44.59 kg/m. GEN: alert, well developed HEENT: clear conjunctiva, nose with + inferior turbinate hypertrophy, pink nasal mucosa, no rhinorrhea, no cobblestoning HEART: regular rate and rhythm, no murmur LUNGS: clear to auscultation bilaterally, no coughing,  unlabored respiration ABDOMEN: soft, non distended  SKIN: hives present on neck   Reviewed:  10/01/2022: saw GI due to indigestion, heartburn, diarrhea, nausea, vomiting. On Protonix BID, bentyl, Zofran. EGD with benign esophageal stenosis and gastric ulcer. Negative H pylori. Suspect IBS diagnosis.   09/17/2022: saw Aggie Moats NP for chronic hives, no other symptoms.  Given  prednisone course. Referred to Allergy.   12/24/2021: seen in urgent care for rash; 1 week of itchy spots on lower legs.  Using benadryl cream, Zyrtec BID and cool compress. Thought to be insect bites. Discussed daily anti histamine and given triamcinolone cream to use PRN.   Assessment:   1. Idiopathic urticaria   2. Pruritus     Plan/Recommendations:  Idiopathic Urticaria (Hives): - At this time etiology of hives and swelling is unknown. Hives can be caused by a variety of different triggers including illness/infection, exercise, pressure, vibrations, extremes of temperature to name a few however majority of the time there is no identifiable trigger.  -We will obtain labs to rule out inflammatory/autoimmune/alpha gal/mast cell causes. -Start Zyrtec 20mg  twice daily and Pepcid 40mg  twice daily.   - Use hydroxyzine 10mg  nightly as needed for itching/hives. - Consider Xolair, if no improvement.    Return in about 2 weeks (around 10/29/2022).  Alesia Morin, MD [facility]

## 2022-10-15 NOTE — Addendum Note (Signed)
Addended byAlesia Morin on: 10/15/2022 12:22 PM   Modules accepted: Level of Service

## 2022-10-15 NOTE — Patient Instructions (Addendum)
Idiopathic Urticaria (Hives): - At this time etiology of hives and swelling is unknown. Hives can be caused by a variety of different triggers including illness/infection, exercise, pressure, vibrations, extremes of temperature to name a few however majority of the time there is no identifiable trigger.  -We will obtain labs to rule out inflammatory/autoimmune/alpha gal/mast cell causes. -Start Zyrtec 20mg  twice daily and Pepcid 40mg  twice daily.   - Use hydroxyzine 10mg  nightly as needed for itching/hives. - Consider Xolair, if no improvement.

## 2022-10-16 ENCOUNTER — Other Ambulatory Visit: Payer: Self-pay

## 2022-10-16 MED ORDER — RIFAXIMIN 550 MG PO TABS: 550.0000 mg | ORAL_TABLET | Freq: Three times a day (TID) | ORAL | 0 refills | Status: AC

## 2022-10-24 LAB — ALPHA-GAL PANEL
Allergen Lamb IgE: <0.1 kU/L
Beef IgE: <0.1 kU/L
IgE (Immunoglobulin E), Serum: 184 IU/mL (ref 6–495)
O215-IgE Alpha-Gal: <0.1 kU/L
Pork IgE: <0.1 kU/L

## 2022-10-24 LAB — CHRONIC URTICARIA: cu index: 2.8 (ref <10)

## 2022-10-24 LAB — TSH+FREE T4
Free T4: 1.43 ng/dL (ref 0.82–1.77)
TSH: 1.37 u[IU]/mL (ref 0.450–4.500)

## 2022-10-24 LAB — ANA W/REFLEX: Anti Nuclear Antibody (ANA): NEGATIVE

## 2022-10-24 LAB — TRYPTASE: Tryptase: 10.6 ug/L (ref 2.2–13.2)

## 2022-11-03 ENCOUNTER — Other Ambulatory Visit: Payer: Self-pay | Admitting: Family Medicine

## 2022-11-03 DIAGNOSIS — L509 Urticaria, unspecified: Secondary | ICD-10-CM

## 2022-11-05 ENCOUNTER — Encounter: Payer: Self-pay | Admitting: Internal Medicine

## 2022-11-05 ENCOUNTER — Ambulatory Visit: Payer: Medicare Other | Admitting: Internal Medicine

## 2022-11-05 ENCOUNTER — Other Ambulatory Visit: Payer: Self-pay

## 2022-11-05 VITALS — BP 130/76 | HR 85 | Temp 98.6°F | Resp 18

## 2022-11-05 DIAGNOSIS — L501 Idiopathic urticaria: Secondary | ICD-10-CM

## 2022-11-05 MED ORDER — FAMOTIDINE 40 MG PO TABS
40.0000 mg | ORAL_TABLET | Freq: Two times a day (BID) | ORAL | 5 refills | Status: DC | PRN
Start: 2022-11-05 — End: 2023-02-11

## 2022-11-05 MED ORDER — CETIRIZINE HCL 10 MG PO TABS: 20.0000 mg | ORAL_TABLET | Freq: Two times a day (BID) | ORAL | 5 refills | Status: DC | PRN

## 2022-11-05 MED ORDER — HYDROXYZINE HCL 10 MG PO TABS: 10.0000 mg | ORAL_TABLET | Freq: Every evening | ORAL | 1 refills | Status: DC | PRN

## 2022-11-05 NOTE — Progress Notes (Signed)
   FOLLOW UP Date of Service/Encounter:  11/05/22   Subjective:  [name] [name] (DOB: Mar 08, 1961) is a 62 y.o. female who [facility] for follow up for idiopathic urticaria.  History obtained from: chart review and patient. Last visit was with me on 10/15/2022 for CSU, started on high dose anti histamines and hydroxyzine, consider Xolair.   Reports doing somewhat better since last visit.  She still has hives several times a week but not as bad as last time and usually just a few hives and short lived.  Down to Zyrtec 10mg  BID from 20mg  BID. Still taking Pepcid 40mg  BID; she has a lot of GI issues and has been told to take Pepcid by GI also.  Has hydroxyzine to use also.   Past Medical History: Past Medical History:  Diagnosis Date   Anxiety 2008   seen in ED /w chest pain but told that she was having anxiety   Arthritis    Colon polyps    History of kidney stones 1999   History of migraine    History of stress test 2010   done due to chest pain /w Dr. Jacinto Halim. Pt. told that it was wnl.   Hives of unknown origin    told by PCP Terri Piedra) to use Zrytec BID to control hives    Hypertension    Kidney stones    Neuromuscular disorder (HCC)    back pain   Obesity, unspecified    Sleep difficulties    has had a sleep study - t old that she didn't meet criteria for machine    Syncope    due to pain    Objective:  BP 130/76   Pulse 85   Temp 98.6 F (37 C)   Resp 18   SpO2 96%  There is no height or weight on file to calculate BMI. Physical Exam: GEN: alert, well developed HEENT: clear conjunctiva, MMM HEART: regular rate and rhythm, no murmur LUNGS: clear to auscultation bilaterally, no coughing, unlabored respiration SKIN: some erythema on chest, no active hives   Assessment:   1. Chronic idiopathic urticaria     Plan/Recommendations:  Idiopathic Urticaria (Hives): - Improved with high dose anti histamines - At this time  etiology of hives and swelling is unknown. Hives can be caused by a variety of different triggers including illness/infection, exercise, pressure, vibrations, extremes of temperature to name a few however majority of the time there is no identifiable trigger.  - 09/2022: normal tryptase, TSH, CU index, alpha gal - Continue Zyrtec 10mg  twice daily and Pepcid 40mg  twice daily.  - If hives worsen, increase to Zyrtec 20mg  twice daily and Pepcid 40mg  twice daily.  Can also try de-escalating once they have been controlled for a few weeks.    - Use hydroxyzine 10mg  nightly as needed for itching/hives. - Consider Xolair, if hives worsen.    Return in about 3 months (around 02/05/2023).  Alesia Morin, MD [facility]

## 2022-11-05 NOTE — Telephone Encounter (Signed)
sent 

## 2022-11-05 NOTE — Patient Instructions (Signed)
Idiopathic Urticaria (Hives): - At this time etiology of hives and swelling is unknown. Hives can be caused by a variety of different triggers including illness/infection, exercise, pressure, vibrations, extremes of temperature to name a few however majority of the time there is no identifiable trigger.  - Continue Zyrtec 10mg  twice daily and Pepcid 40mg  twice daily.  - If hives worsen, increase to Zyrtec 20mg  twice daily and Pepcid 40mg  twice daily.  Can also try de-escalating once they have been controlled for a few weeks.    - Use hydroxyzine 10mg  nightly as needed for itching/hives. - Consider Xolair, if hives worsen.

## 2022-11-07 ENCOUNTER — Ambulatory Visit: Payer: Medicare Other | Admitting: Internal Medicine

## 2022-11-12 ENCOUNTER — Ambulatory Visit (INDEPENDENT_AMBULATORY_CARE_PROVIDER_SITE_OTHER): Payer: Medicare Other | Admitting: Gastroenterology

## 2022-11-24 NOTE — Progress Notes (Addendum)
GI Office Note    Referring Provider: Wylene Men* Primary Care Physician:  Rica Records, FNP Primary Gastroenterologist: Dolores Frame, MD  Date:  11/26/2022  ID:  [name] [name], DOB 1961/04/27, MRN [id number]  Chief Complaint   Chief Complaint  Patient presents with   Diarrhea    Follow up on diarrhea. Patient states she has been controlling diarrhea by eating less. States last two prescribed meds she tried for diarrhea ( not sure of the name of meds ) she broke out in hives.    History of Present Illness  [name] [name] is a 62 y.o. female with a history of anxiety, migraines, obesity, colon polyps, arthritis and ongoing diarrhea/GERD presenting today for follow up of diarrhea and heartburn/N/V.   Colonoscopy 10/20/14:  -Adequate prep. 6 mm sessile polyp in the ascending colon resected with cold snare.  -Path: Sessile serrated adenoma   Colonoscopy 12/28/19:  -Adequate prep.  -Normal colon.  -Repeat colonoscopy recommended in 5 years for surveillance.   Labs 02/2022: CRP nml. TTG IGA negative. IgA nml.    EGD 05/03/2022: -Benign-appearing esophageal stenosis -Hiatal hernia -Gastric fluid -Nonbleeding gastric ulcer with clean ulcer base s/p biopsy -Normal duodenum s/p biopsy -Duodenal biopsy with nonspecific duodenitis, reactive gastropathy, negative for H. pylori, negative esophageal biopsy.   Labs 06/2022: Pancreatic fecal elastase nml. GI profile negative.   Last visit with Dr. Leonides Schanz 08/08/2022.  Patient reported 8-10 bowel movements per day on average, taking Imodium 4 mg every day which helps a little.  Also with some chronic nausea that was initially improved but was having some vomiting again a week prior to this visit.  Overall does not feel well at baseline.  Feels depressed given her GI symptoms.  Some cramping in her lower abdomen as well.  Low FODMAP diet has not helped in the past.  Still following a bland diet.  Referred to  general surgery for consideration of cholecystectomy to see if this would help with symptoms.  She was given Zofran and dicyclomine to help with nausea and abdominal cramping.  Advised GES to evaluate her nausea.  Colonoscopy offered to patient to rule out microscopic colitis however patient declined.  Advised to continue PPI twice daily.  Advised follow-up in 2 months.   EGD 07/11/2022: -Normal esophagus s/p biopsy -Small lateral hernia -Erythematous mucosa in the incisura s/p biopsy -Normal duodenum s/p biopsy -Pathology revealed benign small bowel mucosa, reactive gastropathy in the stomach, negative for H. pylori.  No evidence of EOE.   HIDA scan 07/30/22: -Normal hepatobiliary scan with patent cystic and common bile ducts -Borderline decreased gallbladder EF of 32%   Labs 07/2022: CBC with low Hb of 10.8. CMP with elevated alk phos of 146. HbA1C elevated at 6%. TSH nml.    Patient reported 08/22/2022 that she saw surgeon about her gallbladder and they told her they could not do anything.  She was advised that she did not get scheduled for colonoscopy however then she states that she cannot afford it.  On 5/15 she requested a second opinion.   GES 08/27/2022: Normal gastric emptying study, >85% emptying at 3 hours.  Last office visit 10/01/22.  Tenderness present since September.  Has tried anxiety medication and continues to have symptoms.  Did not feel assented by [facility] surgery regarding her HIDA scan.  Patient notes chronic highs and is unsure where this is coming from.  At times she does not know if she is going to  have a bowel movement or be nausea/vomit.  Taking Zofran as needed but has been dealing with dehydration as well.  Was previously on pantoprazole twice daily as well as Pepcid 40 mg 1-2 times daily.  Felt like heartburn was worse with dicyclomine.  Continues to have poor appetite.  Chest feeling like a fireball in the middle.  For diarrhea she was taking Bentyl 4 times daily  and was continuing to have 4-5 bowel movements daily.  Will have initial semiformed soft stools and then it becomes runny.  Reports difficulty regarding cost to perform colonoscopy.  She states prior to all this she is to be constipated given chronic opioids which she stopped in 2022.  Up until this past September she was not needing anything to help have bowel movements.  At times she is unable to keep down water, oatmeal, or toast.  Advised Imodium, dicyclomine, pepcid, change PPI to nexium. Dicyclomine vs cholestyramine. Trial Xifaxin.  Dr. Levon Hedger did not feel as though surgery would improve her symptoms.  He advised trial of Xifaxan if unhelpful to consider cholestyramine.  Would recommend colonoscopy if nothing seems to help.  Today:  Has been controlling diarrhea by eating less.  Patient has tried Xifaxan without any improvement.  Was previously advised to take omeprazole and famotidine as needed with Tums or Mylanta to help with heartburn symptoms.  Has not found cholestyramine to be helpful in the past either.  Cramps stay no matter what. Still having 4-6 BM daily. Has been eating less but still. Feels like cholestyramine made her hives worse. She felt like the Xifaxin did the same thing. Eating smaller amounts right now and trying to eat more frequently. Last few weeks she used BM 3 times in 2 hours and ate same thing few days later and was fine.  The less she eats and drinks the less diarrhea she has but also last week she got dizzy and felt dehydrated. Mornings tend to be the worst. Not as often is she getting up in the middle of the night.   Still dealing with hives and seeing allergist. On pepcid and zyrtec (was taking 3 per day but now 2 twice per day)  Pepcid does seem to help the heartburn and indigestion. Still has lots of burping. Taking omeprazole once daily. Tries to eat at least 2-3 hours prior to laying down. Tries ot also time when she needs to leave the house.   Current  Outpatient Medications  Medication Sig Dispense Refill   atorvastatin (LIPITOR) 20 MG tablet Take 1 tablet (20 mg total) by mouth daily. 90 tablet 3   cetirizine (ZYRTEC) 10 MG tablet Take 2 tablets (20 mg total) by mouth 2 (two) times daily as needed for allergies (hives). 120 tablet 5   dicyclomine (BENTYL) 10 MG capsule Take 1 capsule (10 mg total) by mouth 4 (four) times daily as needed for spasms. 90 capsule 0   diltiazem (CARDIZEM CD) 120 MG 24 hr capsule TAKE 1 CAPSULE BY MOUTH EVERY DAY 90 capsule 2   famotidine (PEPCID) 40 MG tablet Take 1 tablet (40 mg total) by mouth 2 (two) times daily as needed (hives). 60 tablet 5   hydrochlorothiazide (MICROZIDE) 12.5 MG capsule TAKE 1 CAPSULE BY MOUTH EVERY DAY 30 capsule 1   hydrOXYzine (ATARAX) 10 MG tablet Take 1 tablet (10 mg total) by mouth at bedtime as needed for itching (hives). 30 tablet 1   omeprazole (PRILOSEC) 40 MG capsule Take 1 capsule (40 mg total) by  mouth daily. 60 capsule 2   ondansetron (ZOFRAN) 4 MG tablet Take 1 tablet (4 mg total) by mouth 3 (three) times daily as needed for nausea or vomiting. 30 tablet 1   triamcinolone ointment (KENALOG) 0.5 % Apply 1 Application topically 2 (two) times daily. 30 g 0   No current facility-administered medications for this visit.   Facility-Administered Medications Ordered in Other Visits  Medication Dose Route Frequency Provider Last Rate Last Admin   gadopentetate dimeglumine (MAGNEVIST) injection 20 mL  20 mL Intravenous Once PRN Asa Lente, MD        Past Medical History:  Diagnosis Date   Anxiety 2008   seen in ED /w chest pain but told that she was having anxiety   Arthritis    Colon polyps    History of kidney stones 1999   History of migraine    History of stress test 2010   done due to chest pain /w Dr. Jacinto Halim. Pt. told that it was wnl.   Hives of unknown origin    told by PCP Terri Piedra) to use Zrytec BID to control hives    Hypertension    Kidney stones     Neuromuscular disorder (HCC)    back pain   Obesity, unspecified    Sleep difficulties    has had a sleep study - t old that she didn't meet criteria for machine    Syncope    due to pain    Past Surgical History:  Procedure Laterality Date   COLONOSCOPY  2016   DECOMPRESSIVE LUMBAR LAMINECTOMY LEVEL 1 N/A 10/28/2013   Procedure: DECOMPRESSION L4-5;  Surgeon: Venita Lick, MD;  Location: [facility] OR;  Service: Orthopedics;  Laterality: N/A;   KNEE ARTHROPLASTY Right 12/24/2018   Procedure: COMPUTER ASSISTED TOTAL KNEE ARTHROPLASTY;  Surgeon: Samson Frederic, MD;  Location: [facility];  Service: Orthopedics;  Laterality: Right;   KNEE ARTHROPLASTY Left 09/17/2019   Procedure: COMPUTER ASSISTED TOTAL KNEE ARTHROPLASTY;  Surgeon: Samson Frederic, MD;  Location: [facility];  Service: Orthopedics;  Laterality: Left;   KNEE ARTHROSCOPY  12/2008   left knee   KNEE ARTHROSCOPY Right 01/27/2018   PARTIAL HYSTERECTOMY  10/1994   thumb surgery Right 03/1975    Family History  Problem Relation Age of Onset   Stroke Father    Diabetes Other    Hyperlipidemia Other    Heart disease Other    Hypertension Other    Cancer Other    Breast cancer Neg Hx    Colon cancer Neg Hx    Esophageal cancer Neg Hx    Stomach cancer Neg Hx    Rectal cancer Neg Hx    Allergic rhinitis Neg Hx    Angioedema Neg Hx    Asthma Neg Hx    Eczema Neg Hx    Urticaria Neg Hx     Allergies as of 11/26/2022 - Review Complete 11/26/2022  Allergen Reaction Noted   Gabapentin  07/28/2015   Other  12/19/2018   Penicillin g  11/13/2019   Penicillins Hives and Rash 07/10/2013    Social History   Socioeconomic History   Marital status: Single    Spouse name: Not on file   Number of children: 0   Years of education: Not on file   Highest education level: Bachelor's degree (e.g., BA, AB, BS)  Occupational History   Occupation: Radiographer, therapeutic  Tobacco Use   Smoking status: Never   Smokeless tobacco: Never  Vaping  Use  Vaping status: Never Used  Substance and Sexual Activity   Alcohol use: Yes    Comment: rare   Drug use: No   Sexual activity: Not on file  Other Topics Concern   Not on file  Social History Narrative   Not on file   Social Determinants of Health   Financial Resource Strain: Low Risk  (09/13/2022)   Overall Financial Resource Strain (CARDIA)    Difficulty of Paying Living Expenses: Not very hard  Food Insecurity: No Food Insecurity (09/13/2022)   Hunger Vital Sign    Worried About Running Out of Food in the Last Year: Never true    Ran Out of Food in the Last Year: Never true  Transportation Needs: No Transportation Needs (09/13/2022)   PRAPARE - Administrator, Civil Service (Medical): No    Lack of Transportation (Non-Medical): No  Physical Activity: Unknown (09/13/2022)   Exercise Vital Sign    Days of Exercise per Week: 0 days    Minutes of Exercise per Session: Not on file  Stress: Stress Concern Present (09/13/2022)   [employer] of Occupational Health - Occupational Stress Questionnaire    Feeling of Stress : To some extent  Social Connections: Socially Isolated (09/13/2022)   Social Connection and Isolation Panel [NHANES]    Frequency of Communication with Friends and Family: More than three times a week    Frequency of Social Gatherings with Friends and Family: Not on file    Attends Religious Services: Never    Database administrator or Organizations: No    Attends Engineer, structural: Not on file    Marital Status: Never married     Review of Systems   Gen: Denies fever, chills, anorexia. Denies fatigue, weakness, weight loss.  CV: Denies chest pain, palpitations, syncope, peripheral edema, and claudication. Resp: Denies dyspnea at rest, cough, wheezing, coughing up blood, and pleurisy. GI: See HPI Derm: Denies rash, itching, dry skin Psych: Denies depression, anxiety, memory loss, confusion. No homicidal or suicidal ideation.   Heme: Denies bruising, bleeding, and enlarged lymph nodes.   Physical Exam   BP (!) 152/78   Pulse 93   Temp 98 F (36.7 C) (Oral)   Ht 5' (1.524 m)   Wt 233 lb (105.7 kg)   BMI 45.50 kg/m   General:   Alert and oriented. No distress noted. Pleasant and cooperative.  Head:  Normocephalic and atraumatic. Eyes:  Conjuctiva clear without scleral icterus. Mouth:  Oral mucosa pink and moist. Good dentition. No lesions. Abdomen:  +BS, soft, non-distended.  Mild generalized tenderness throughout.  No rebound or guarding. No HSM or masses noted. Rectal: deferred Msk:  Symmetrical without gross deformities. Normal posture. Extremities:  Without edema. Neurologic:  Alert and  oriented x4 Psych:  Alert and cooperative. Normal mood and affect.   Assessment  [name] [name] is a 62 y.o. female with a history of anxiety, migraines, obesity, colon polyps, arthritis and ongoing diarrhea/GERD presenting today for follow up of diarrhea and heartburn/N/V.   N/V, GERD: History of gastric ulcer diagnosed on EGD in January of this year with repeat EGD 2 months later documenting healing.  Biopsies have ruled out H. pylori as well as EOE.  Previous workup with normal GES and HIDA scan in April that was borderline with decreased gallbladder EF of 32%.  Has continue Zofran as needed.  Continues to struggle intermittently with staying hydrated.  Remains on omeprazole 40 mg once daily and Pepcid  40 mg daily (advised by allergist) as well that appears to be controlling her indigestion.  Has continued to avoid any NSAIDs.  Symptoms are always worse in the mornings.  Still has frequent burping but overall well-controlled and prior.  Does not eat late and has been diligent on avoiding any potential triggers.  Diarrhea, abdominal cramping: Prior workup has consisted of negative GI profile, negative celiac serologies, normal thyroid and fecal elastase.  She has tried dicyclomine without any significant improvement  of abdominal pain as well as Xifaxan and cholestyramine which have caused her chronic hives to worsen and therefore was not tolerated well.  Currently has been working to control her symptoms more with eating smaller meals although this has not made a significant difference.  Given all of her extensive prior workup and trying different medications which have ended up in adverse effects she is not interested in trying other medications or management at this time including Viberzi or Elavil.  She will continue smaller meals and work towards staying hydrated to manage for now.  We had an extensive discussion today regarding her diarrhea and abdominal cramping and she again stressed her frustration regarding her symptoms and quality of life.  We discussed additional methods of evaluation as well as potential treatment options today.  We discussed the prior recommendations given by Dr. Levon Hedger as well.  Given her borderline HIDA scan results she strongly feels as though she would prefer to have cholecystectomy over performing colonoscopy.  She indicates that she understands her risk of surgery and that there is no guarantee that cholecystectomy would improve her symptoms and that it could possibly worsen diarrhea.  She continues to have hesitation regarding performing colonoscopy to assess for microscopic colitis given this is only diagnostic and there is not much chance of anything being done by colonoscopy that could fix her symptoms and that there is a chance that gallbladder removal could possibly fix her symptoms although less likely. Given this discussion we will go ahead and refer to general surgery and will place orders for colonoscopy that way she can discuss with insurance again if there can be an appeal or a way to potentially get some of the colonoscopy covered and not have to pay 100% out-of-pocket as she is not due for surveillance colonoscopy until 2026.  She will be referred to local general surgery  given she did not have a good first impression with surgeon she seen at [facility] surgery.   PLAN   Consult to general surgery Proceed with colonoscopy with propofol by Dr. Levon Hedger in near future: the risks, benefits, and alternatives have been discussed with the patient in detail. The patient states understanding and desires to proceed. ASA 3 - BMI (work toward insurance coverage) Continue smaller more frequent meals.  Follow up in 3 months    Brooke Bonito, MSN, FNP-BC, AGACNP-BC [facility]  I have reviewed the note and agree with the APP's assessment as described in this progress note  Katrinka Blazing, MD Gastroenterology and Hepatology [facility]

## 2022-11-26 ENCOUNTER — Encounter: Payer: Self-pay | Admitting: Gastroenterology

## 2022-11-26 ENCOUNTER — Ambulatory Visit (INDEPENDENT_AMBULATORY_CARE_PROVIDER_SITE_OTHER): Payer: Medicare Other | Admitting: Gastroenterology

## 2022-11-26 VITALS — BP 152/78 | HR 93 | Temp 98.0°F | Ht 60.0 in | Wt 233.0 lb

## 2022-11-26 DIAGNOSIS — R197 Diarrhea, unspecified: Secondary | ICD-10-CM | POA: Diagnosis not present

## 2022-11-26 DIAGNOSIS — R112 Nausea with vomiting, unspecified: Secondary | ICD-10-CM

## 2022-11-26 DIAGNOSIS — R1013 Epigastric pain: Secondary | ICD-10-CM | POA: Diagnosis not present

## 2022-11-26 DIAGNOSIS — K828 Other specified diseases of gallbladder: Secondary | ICD-10-CM

## 2022-11-26 DIAGNOSIS — K219 Gastro-esophageal reflux disease without esophagitis: Secondary | ICD-10-CM

## 2022-11-26 NOTE — Patient Instructions (Addendum)
Continue eating your smaller meals but working to stay hydrated is much as possible.  Will send referral to general surgery here locally in [place] to discuss having your gallbladder removed.  Will also go ahead and order colonoscopy and see if our staff can reach out to insurance to verify coverage or see what we have to do to submit an appeal to get some of this covered.   If anything worsens in the meantime or you change your mind and you would like to try Viberzi or Lomotil for the diarrhea and abdominal cramping please let me know.  Will plan to follow-up in 3 months.   It was a pleasure to see you today. I want to create trusting relationships with patients. If you receive a survey regarding your visit,  I greatly appreciate you taking time to fill this out on paper or through your MyChart. I value your feedback.  Brooke Bonito, MSN, FNP-BC, AGACNP-BC [facility]

## 2022-11-27 ENCOUNTER — Encounter: Payer: Self-pay | Admitting: Family Medicine

## 2022-12-03 ENCOUNTER — Telehealth: Payer: Self-pay | Admitting: Family Medicine

## 2022-12-03 NOTE — Telephone Encounter (Signed)
Patient called asking does she need a chest xray had xrays of her left should last week something was seen on those xrays. Contact patient if xrays will be ordered for chest.

## 2022-12-13 ENCOUNTER — Encounter: Payer: Self-pay | Admitting: Family Medicine

## 2022-12-13 ENCOUNTER — Ambulatory Visit: Payer: Medicare Other | Admitting: Family Medicine

## 2022-12-13 VITALS — BP 138/82 | HR 103 | Ht 60.0 in | Wt 228.0 lb

## 2022-12-13 DIAGNOSIS — Z1231 Encounter for screening mammogram for malignant neoplasm of breast: Secondary | ICD-10-CM

## 2022-12-13 DIAGNOSIS — R911 Solitary pulmonary nodule: Secondary | ICD-10-CM | POA: Diagnosis not present

## 2022-12-13 MED ORDER — HYDROCHLOROTHIAZIDE 12.5 MG PO CAPS: ORAL_CAPSULE | ORAL | 3 refills | Status: DC

## 2022-12-13 NOTE — Assessment & Plan Note (Addendum)
Chest xray ordered to further evaluate- awaiting results will follow up. Patient is non-smoker with no respiratory symptoms today. Patient denies family history of lung cancer.

## 2022-12-13 NOTE — Progress Notes (Signed)
Patient Office Visit   Subjective   Patient ID: [name] [name], female    DOB: 11-Feb-1961  Age: 62 y.o. MRN: [id number]  CC:  Chief Complaint  Patient presents with   Follow-up    Patient is here for f/u. States she had a shoulder xray at [facility] and they said she might have nodules on her lungs and to f/u with her PCP.     HPI [name] [name] 62 year old female, presents to the clinic follow up her [facility] xray results which showed lung nodules. She  has a past medical history of Anxiety (2008), Arthritis, Colon polyps, History of kidney stones (1999), History of migraine, History of stress test (2010), Hives of unknown origin, Hypertension, Kidney stones, Neuromuscular disorder (HCC), Obesity, unspecified, Sleep difficulties, and Syncope.For the details of today's visit, please refer to assessment and plan.   HPI    Outpatient Encounter Medications as of 12/13/2022  Medication Sig   atorvastatin (LIPITOR) 20 MG tablet Take 1 tablet (20 mg total) by mouth daily.   cetirizine (ZYRTEC) 10 MG tablet Take 2 tablets (20 mg total) by mouth 2 (two) times daily as needed for allergies (hives).   diltiazem (CARDIZEM CD) 120 MG 24 hr capsule TAKE 1 CAPSULE BY MOUTH EVERY DAY   famotidine (PEPCID) 40 MG tablet Take 1 tablet (40 mg total) by mouth 2 (two) times daily as needed (hives).   omeprazole (PRILOSEC) 40 MG capsule Take 1 capsule (40 mg total) by mouth daily.   ondansetron (ZOFRAN) 4 MG tablet Take 1 tablet (4 mg total) by mouth 3 (three) times daily as needed for nausea or vomiting.   triamcinolone ointment (KENALOG) 0.5 % Apply 1 Application topically 2 (two) times daily.   [DISCONTINUED] hydrochlorothiazide (MICROZIDE) 12.5 MG capsule TAKE 1 CAPSULE BY MOUTH EVERY DAY   dicyclomine (BENTYL) 10 MG capsule Take 1 capsule (10 mg total) by mouth 4 (four) times daily as needed for spasms. (Patient not taking: Reported on 12/13/2022)   hydrochlorothiazide (MICROZIDE) 12.5 MG  capsule TAKE 1 CAPSULE BY MOUTH EVERY DAY   hydrOXYzine (ATARAX) 10 MG tablet Take 1 tablet (10 mg total) by mouth at bedtime as needed for itching (hives). (Patient not taking: Reported on 12/13/2022)   Facility-Administered Encounter Medications as of 12/13/2022  Medication   gadopentetate dimeglumine (MAGNEVIST) injection 20 mL    Past Surgical History:  Procedure Laterality Date   COLONOSCOPY  2016   DECOMPRESSIVE LUMBAR LAMINECTOMY LEVEL 1 N/A 10/28/2013   Procedure: DECOMPRESSION L4-5;  Surgeon: Venita Lick, MD;  Location: [facility];  Service: Orthopedics;  Laterality: N/A;   KNEE ARTHROPLASTY Right 12/24/2018   Procedure: COMPUTER ASSISTED TOTAL KNEE ARTHROPLASTY;  Surgeon: Samson Frederic, MD;  Location: [facility];  Service: Orthopedics;  Laterality: Right;   KNEE ARTHROPLASTY Left 09/17/2019   Procedure: COMPUTER ASSISTED TOTAL KNEE ARTHROPLASTY;  Surgeon: Samson Frederic, MD;  Location: [facility];  Service: Orthopedics;  Laterality: Left;   KNEE ARTHROSCOPY  12/2008   left knee   KNEE ARTHROSCOPY Right 01/27/2018   PARTIAL HYSTERECTOMY  10/1994   thumb surgery Right 03/1975    Review of Systems  Constitutional:  Negative for chills and fever.  Eyes:  Negative for blurred vision.  Respiratory:  Negative for cough, hemoptysis, sputum production, shortness of breath and wheezing.   Cardiovascular:  Negative for chest pain.  Neurological:  Negative for dizziness and headaches.      Objective    BP 138/82   Pulse Marland Kitchen)  103   Ht 5' (1.524 m)   Wt 228 lb (103.4 kg)   SpO2 96%   BMI 44.53 kg/m   Physical Exam Vitals reviewed.  Constitutional:      General: She is not in acute distress.    Appearance: Normal appearance. She is not ill-appearing, toxic-appearing or diaphoretic.  HENT:     Head: Normocephalic.  Eyes:     General:        Right eye: No discharge.        Left eye: No discharge.     Conjunctiva/sclera: Conjunctivae normal.  Cardiovascular:     Rate and Rhythm: Normal  rate.     Pulses: Normal pulses.     Heart sounds: Normal heart sounds.  Pulmonary:     Effort: Pulmonary effort is normal. No respiratory distress.     Breath sounds: Normal breath sounds.  Chest:     Chest wall: No tenderness.  Musculoskeletal:        General: Normal range of motion.     Cervical back: Normal range of motion.  Skin:    General: Skin is warm and dry.     Capillary Refill: Capillary refill takes less than 2 seconds.  Neurological:     General: No focal deficit present.     Mental Status: She is alert and oriented to person, place, and time.     Coordination: Coordination normal.     Gait: Gait normal.  Psychiatric:        Behavior: Behavior normal.       Assessment & Plan:  Lung nodule seen on imaging study Assessment & Plan: Chest xray ordered to further evaluate- awaiting results will follow up. Patient is non-smoker with no respiratory symptoms today. Patient denies family history of lung cancer.  Orders: -     DG Chest 2 View; Future  Encounter for screening mammogram for malignant neoplasm of breast -     Digital Screening Mammogram, Left and Right; Future  Other orders -     hydroCHLOROthiazide; TAKE 1 CAPSULE BY MOUTH EVERY DAY  Dispense: 30 capsule; Refill: 3    Return if symptoms worsen or fail to improve.   Cruzita Lederer Newman Nip, FNP

## 2022-12-13 NOTE — Patient Instructions (Signed)
        Great to see you today.  I have refilled the medication(s) we provide.    - Please take medications as prescribed. - Follow up with your primary health provider if any health concerns arises. - If symptoms worsen please contact your primary care provider and/or visit the emergency department.  

## 2022-12-17 ENCOUNTER — Ambulatory Visit (HOSPITAL_COMMUNITY)
Admission: RE | Admit: 2022-12-17 | Discharge: 2022-12-17 | Disposition: A | Discharge status: Home / Self Care | Payer: Medicare Other | Source: Ambulatory Visit | Attending: Family Medicine | Admitting: Family Medicine

## 2022-12-17 DIAGNOSIS — R911 Solitary pulmonary nodule: Secondary | ICD-10-CM | POA: Insufficient documentation

## 2022-12-24 ENCOUNTER — Encounter: Payer: Self-pay | Admitting: Family Medicine

## 2023-01-01 ENCOUNTER — Ambulatory Visit: Payer: Medicare Other | Admitting: General Surgery

## 2023-01-01 ENCOUNTER — Encounter: Payer: Self-pay | Admitting: General Surgery

## 2023-01-01 VITALS — BP 154/83 | HR 86 | Temp 97.6°F | Resp 16 | Ht 60.0 in | Wt 226.0 lb

## 2023-01-01 DIAGNOSIS — K811 Chronic cholecystitis: Secondary | ICD-10-CM

## 2023-01-02 NOTE — Progress Notes (Signed)
[name] [name]; [id number]; Oct 27, 1960   HPI Patient is a 62 year old white female who was referred to my care by Brooke Bonito of gastroenterology for evaluation and treatment of chronic cholecystitis.  Patient has been having intermittent episodes of abdominal pain, nausea, bloating, and diarrhea for many months.  She has had an extensive workup and was found on HIDA scan to have a borderline gallbladder ejection fraction.  She states over the last couple months that her upper abdominal symptoms have worsened.  She denies any fever, chills, or jaundice.  She does not know of any particular trigger foods that bring on her symptoms.  She states she goes to the bathroom multiple times a day.  She has been placed on multiple medications to try to control her symptoms. Past Medical History:  Diagnosis Date   Anxiety 2008   seen in ED /w chest pain but told that she was having anxiety   Arthritis    Colon polyps    History of kidney stones 1999   History of migraine    History of stress test 2010   done due to chest pain /w Dr. Jacinto Halim. Pt. told that it was wnl.   Hives of unknown origin    told by PCP Terri Piedra) to use Zrytec BID to control hives    Hypertension    Kidney stones    Neuromuscular disorder (HCC)    back pain   Obesity, unspecified    Sleep difficulties    has had a sleep study - t old that she didn't meet criteria for machine    Syncope    due to pain    Past Surgical History:  Procedure Laterality Date   COLONOSCOPY  2016   DECOMPRESSIVE LUMBAR LAMINECTOMY LEVEL 1 N/A 10/28/2013   Procedure: DECOMPRESSION L4-5;  Surgeon: Venita Lick, MD;  Location: [facility];  Service: Orthopedics;  Laterality: N/A;   KNEE ARTHROPLASTY Right 12/24/2018   Procedure: COMPUTER ASSISTED TOTAL KNEE ARTHROPLASTY;  Surgeon: Samson Frederic, MD;  Location: [facility];  Service: Orthopedics;  Laterality: Right;   KNEE ARTHROPLASTY Left 09/17/2019   Procedure: COMPUTER ASSISTED TOTAL KNEE ARTHROPLASTY;   Surgeon: Samson Frederic, MD;  Location: [facility];  Service: Orthopedics;  Laterality: Left;   KNEE ARTHROSCOPY  12/2008   left knee   KNEE ARTHROSCOPY Right 01/27/2018   PARTIAL HYSTERECTOMY  10/1994   thumb surgery Right 03/1975    Family History  Problem Relation Age of Onset   Stroke Father    Diabetes Other    Hyperlipidemia Other    Heart disease Other    Hypertension Other    Cancer Other    Breast cancer Neg Hx    Colon cancer Neg Hx    Esophageal cancer Neg Hx    Stomach cancer Neg Hx    Rectal cancer Neg Hx    Allergic rhinitis Neg Hx    Angioedema Neg Hx    Asthma Neg Hx    Eczema Neg Hx    Urticaria Neg Hx     Current Outpatient Medications on File Prior to Visit  Medication Sig Dispense Refill   atorvastatin (LIPITOR) 20 MG tablet Take 1 tablet (20 mg total) by mouth daily. 90 tablet 3   cetirizine (ZYRTEC) 10 MG tablet Take 2 tablets (20 mg total) by mouth 2 (two) times daily as needed for allergies (hives). 120 tablet 5   diltiazem (CARDIZEM CD) 120 MG 24 hr capsule TAKE 1 CAPSULE BY MOUTH EVERY DAY 90  capsule 2   famotidine (PEPCID) 40 MG tablet Take 1 tablet (40 mg total) by mouth 2 (two) times daily as needed (hives). 60 tablet 5   hydrochlorothiazide (MICROZIDE) 12.5 MG capsule TAKE 1 CAPSULE BY MOUTH EVERY DAY 30 capsule 3   omeprazole (PRILOSEC) 40 MG capsule Take 1 capsule (40 mg total) by mouth daily. 60 capsule 2   ondansetron (ZOFRAN) 4 MG tablet Take 1 tablet (4 mg total) by mouth 3 (three) times daily as needed for nausea or vomiting. 30 tablet 1   triamcinolone ointment (KENALOG) 0.5 % Apply 1 Application topically 2 (two) times daily. 30 g 0   dicyclomine (BENTYL) 10 MG capsule Take 1 capsule (10 mg total) by mouth 4 (four) times daily as needed for spasms. (Patient not taking: Reported on 12/13/2022) 90 capsule 0   hydrOXYzine (ATARAX) 10 MG tablet Take 1 tablet (10 mg total) by mouth at bedtime as needed for itching (hives). (Patient not taking:  Reported on 12/13/2022) 30 tablet 1   Current Facility-Administered Medications on File Prior to Visit  Medication Dose Route Frequency Provider Last Rate Last Admin   gadopentetate dimeglumine (MAGNEVIST) injection 20 mL  20 mL Intravenous Once PRN Sater, Pearletha Furl, MD        Allergies  Allergen Reactions   Gabapentin     Dizziness   Other     American cockroaches   Penicillin G     Other reaction(s): hives   Penicillins Hives and Rash    Did it involve swelling of the face/tongue/throat, SOB, or low BP? No Did it involve sudden or severe rash/hives, skin peeling, or any reaction on the inside of your mouth or nose? No Did you need to seek medical attention at a hospital or doctor's office? Yes When did it last happen?      Childhood allergy If all above answers are "NO", may proceed with cephalosporin use.     Social History   Substance and Sexual Activity  Alcohol Use Yes   Comment: rare    Social History   Tobacco Use  Smoking Status Never  Smokeless Tobacco Never    Review of Systems  Constitutional: Negative.   HENT: Negative.    Eyes: Negative.   Respiratory: Negative.    Cardiovascular: Negative.   Gastrointestinal:  Positive for abdominal pain, heartburn, nausea and vomiting.  Genitourinary: Negative.   Musculoskeletal:  Positive for back pain.  Skin: Negative.   Neurological: Negative.   Endo/Heme/Allergies: Negative.   Psychiatric/Behavioral: Negative.      Objective   Vitals:   01/01/23 1256  BP: (!) 154/83  Pulse: 86  Resp: 16  Temp: 97.6 F (36.4 C)  SpO2: 96%    Physical Exam Vitals reviewed.  Constitutional:      Appearance: Normal appearance. She is obese. She is not ill-appearing.  HENT:     Head: Normocephalic and atraumatic.  Eyes:     General: No scleral icterus. Cardiovascular:     Rate and Rhythm: Normal rate and regular rhythm.     Heart sounds: Normal heart sounds. No murmur heard.    No friction rub. No gallop.   Pulmonary:     Effort: Pulmonary effort is normal. No respiratory distress.     Breath sounds: Normal breath sounds. No stridor. No wheezing, rhonchi or rales.  Abdominal:     General: Bowel sounds are normal. There is no distension.     Palpations: Abdomen is soft. There is no mass.  Tenderness: There is abdominal tenderness. There is no guarding or rebound.     Hernia: No hernia is present.     Comments: She does have some discomfort to deep palpation in the right upper quadrant.  No rigidity is noted.  Skin:    General: Skin is warm and dry.  Neurological:     Mental Status: She is alert and oriented to person, place, and time.   GI notes reviewed.  HIDA scan, ultrasound, gastric emptying studies reviewed  Assessment  Multiple abdominal complaints, diarrhea.  Workup has only revealed a borderline gallbladder ejection fraction.  Patient states she thinks her symptoms were reproduced with CCK injection Plan  I told the patient that a cholecystectomy may not relieve all her symptoms and could make her diarrhea worse.  She states that she would like to proceed with surgery in order to possibly relieve some of her symptoms.  Will proceed with a robotic assisted laparoscopic cholecystectomy on 02/04/2023.  The risks and benefits of the procedure including bleeding, infection, hepatobiliary injury, the possibility of an open procedure, nonresolution of her symptoms, and the possibility of worsening her diarrhea were fully explained to the patient, who gave informed consent.

## 2023-01-04 NOTE — Addendum Note (Signed)
Addended by: Phillips Odor on: 01/04/2023 11:26 AM   Modules accepted: Orders

## 2023-01-10 NOTE — H&P (Signed)
[name] [name]; [id number]; Oct 27, 1960   HPI Patient is a 62 year old white female who was referred to my care by Brooke Bonito of gastroenterology for evaluation and treatment of chronic cholecystitis.  Patient has been having intermittent episodes of abdominal pain, nausea, bloating, and diarrhea for many months.  She has had an extensive workup and was found on HIDA scan to have a borderline gallbladder ejection fraction.  She states over the last couple months that her upper abdominal symptoms have worsened.  She denies any fever, chills, or jaundice.  She does not know of any particular trigger foods that bring on her symptoms.  She states she goes to the bathroom multiple times a day.  She has been placed on multiple medications to try to control her symptoms. Past Medical History:  Diagnosis Date   Anxiety 2008   seen in ED /w chest pain but told that she was having anxiety   Arthritis    Colon polyps    History of kidney stones 1999   History of migraine    History of stress test 2010   done due to chest pain /w Dr. Jacinto Halim. Pt. told that it was wnl.   Hives of unknown origin    told by PCP Terri Piedra) to use Zrytec BID to control hives    Hypertension    Kidney stones    Neuromuscular disorder (HCC)    back pain   Obesity, unspecified    Sleep difficulties    has had a sleep study - t old that she didn't meet criteria for machine    Syncope    due to pain    Past Surgical History:  Procedure Laterality Date   COLONOSCOPY  2016   DECOMPRESSIVE LUMBAR LAMINECTOMY LEVEL 1 N/A 10/28/2013   Procedure: DECOMPRESSION L4-5;  Surgeon: Venita Lick, MD;  Location: [facility];  Service: Orthopedics;  Laterality: N/A;   KNEE ARTHROPLASTY Right 12/24/2018   Procedure: COMPUTER ASSISTED TOTAL KNEE ARTHROPLASTY;  Surgeon: Samson Frederic, MD;  Location: [facility];  Service: Orthopedics;  Laterality: Right;   KNEE ARTHROPLASTY Left 09/17/2019   Procedure: COMPUTER ASSISTED TOTAL KNEE ARTHROPLASTY;   Surgeon: Samson Frederic, MD;  Location: [facility];  Service: Orthopedics;  Laterality: Left;   KNEE ARTHROSCOPY  12/2008   left knee   KNEE ARTHROSCOPY Right 01/27/2018   PARTIAL HYSTERECTOMY  10/1994   thumb surgery Right 03/1975    Family History  Problem Relation Age of Onset   Stroke Father    Diabetes Other    Hyperlipidemia Other    Heart disease Other    Hypertension Other    Cancer Other    Breast cancer Neg Hx    Colon cancer Neg Hx    Esophageal cancer Neg Hx    Stomach cancer Neg Hx    Rectal cancer Neg Hx    Allergic rhinitis Neg Hx    Angioedema Neg Hx    Asthma Neg Hx    Eczema Neg Hx    Urticaria Neg Hx     Current Outpatient Medications on File Prior to Visit  Medication Sig Dispense Refill   atorvastatin (LIPITOR) 20 MG tablet Take 1 tablet (20 mg total) by mouth daily. 90 tablet 3   cetirizine (ZYRTEC) 10 MG tablet Take 2 tablets (20 mg total) by mouth 2 (two) times daily as needed for allergies (hives). 120 tablet 5   diltiazem (CARDIZEM CD) 120 MG 24 hr capsule TAKE 1 CAPSULE BY MOUTH EVERY DAY 90  capsule 2   famotidine (PEPCID) 40 MG tablet Take 1 tablet (40 mg total) by mouth 2 (two) times daily as needed (hives). 60 tablet 5   hydrochlorothiazide (MICROZIDE) 12.5 MG capsule TAKE 1 CAPSULE BY MOUTH EVERY DAY 30 capsule 3   omeprazole (PRILOSEC) 40 MG capsule Take 1 capsule (40 mg total) by mouth daily. 60 capsule 2   ondansetron (ZOFRAN) 4 MG tablet Take 1 tablet (4 mg total) by mouth 3 (three) times daily as needed for nausea or vomiting. 30 tablet 1   triamcinolone ointment (KENALOG) 0.5 % Apply 1 Application topically 2 (two) times daily. 30 g 0   dicyclomine (BENTYL) 10 MG capsule Take 1 capsule (10 mg total) by mouth 4 (four) times daily as needed for spasms. (Patient not taking: Reported on 12/13/2022) 90 capsule 0   hydrOXYzine (ATARAX) 10 MG tablet Take 1 tablet (10 mg total) by mouth at bedtime as needed for itching (hives). (Patient not taking:  Reported on 12/13/2022) 30 tablet 1   Current Facility-Administered Medications on File Prior to Visit  Medication Dose Route Frequency Provider Last Rate Last Admin   gadopentetate dimeglumine (MAGNEVIST) injection 20 mL  20 mL Intravenous Once PRN Sater, Pearletha Furl, MD        Allergies  Allergen Reactions   Gabapentin     Dizziness   Other     American cockroaches   Penicillin G     Other reaction(s): hives   Penicillins Hives and Rash    Did it involve swelling of the face/tongue/throat, SOB, or low BP? No Did it involve sudden or severe rash/hives, skin peeling, or any reaction on the inside of your mouth or nose? No Did you need to seek medical attention at a hospital or doctor's office? Yes When did it last happen?      Childhood allergy If all above answers are "NO", may proceed with cephalosporin use.     Social History   Substance and Sexual Activity  Alcohol Use Yes   Comment: rare    Social History   Tobacco Use  Smoking Status Never  Smokeless Tobacco Never    Review of Systems  Constitutional: Negative.   HENT: Negative.    Eyes: Negative.   Respiratory: Negative.    Cardiovascular: Negative.   Gastrointestinal:  Positive for abdominal pain, heartburn, nausea and vomiting.  Genitourinary: Negative.   Musculoskeletal:  Positive for back pain.  Skin: Negative.   Neurological: Negative.   Endo/Heme/Allergies: Negative.   Psychiatric/Behavioral: Negative.      Objective   Vitals:   01/01/23 1256  BP: (!) 154/83  Pulse: 86  Resp: 16  Temp: 97.6 F (36.4 C)  SpO2: 96%    Physical Exam Vitals reviewed.  Constitutional:      Appearance: Normal appearance. She is obese. She is not ill-appearing.  HENT:     Head: Normocephalic and atraumatic.  Eyes:     General: No scleral icterus. Cardiovascular:     Rate and Rhythm: Normal rate and regular rhythm.     Heart sounds: Normal heart sounds. No murmur heard.    No friction rub. No gallop.   Pulmonary:     Effort: Pulmonary effort is normal. No respiratory distress.     Breath sounds: Normal breath sounds. No stridor. No wheezing, rhonchi or rales.  Abdominal:     General: Bowel sounds are normal. There is no distension.     Palpations: Abdomen is soft. There is no mass.  Tenderness: There is abdominal tenderness. There is no guarding or rebound.     Hernia: No hernia is present.     Comments: She does have some discomfort to deep palpation in the right upper quadrant.  No rigidity is noted.  Skin:    General: Skin is warm and dry.  Neurological:     Mental Status: She is alert and oriented to person, place, and time.   GI notes reviewed.  HIDA scan, ultrasound, gastric emptying studies reviewed  Assessment  Multiple abdominal complaints, diarrhea.  Workup has only revealed a borderline gallbladder ejection fraction.  Patient states she thinks her symptoms were reproduced with CCK injection Plan  I told the patient that a cholecystectomy may not relieve all her symptoms and could make her diarrhea worse.  She states that she would like to proceed with surgery in order to possibly relieve some of her symptoms.  Will proceed with a robotic assisted laparoscopic cholecystectomy on 02/04/2023.  The risks and benefits of the procedure including bleeding, infection, hepatobiliary injury, the possibility of an open procedure, nonresolution of her symptoms, and the possibility of worsening her diarrhea were fully explained to the patient, who gave informed consent.

## 2023-01-21 ENCOUNTER — Encounter: Payer: Self-pay | Admitting: Family Medicine

## 2023-01-21 ENCOUNTER — Ambulatory Visit: Payer: Medicare Other | Admitting: Family Medicine

## 2023-01-21 ENCOUNTER — Encounter (HOSPITAL_COMMUNITY): Payer: Self-pay

## 2023-01-21 ENCOUNTER — Ambulatory Visit (HOSPITAL_COMMUNITY)
Admission: RE | Admit: 2023-01-21 | Discharge: 2023-01-21 | Disposition: A | Discharge status: Home / Self Care | Payer: Medicare Other | Source: Ambulatory Visit | Attending: Family Medicine | Admitting: Family Medicine

## 2023-01-21 VITALS — BP 135/78 | HR 97 | Resp 18 | Ht 60.0 in | Wt 228.8 lb

## 2023-01-21 DIAGNOSIS — Z1231 Encounter for screening mammogram for malignant neoplasm of breast: Secondary | ICD-10-CM | POA: Diagnosis present

## 2023-01-21 DIAGNOSIS — E559 Vitamin D deficiency, unspecified: Secondary | ICD-10-CM | POA: Diagnosis not present

## 2023-01-21 DIAGNOSIS — R001 Bradycardia, unspecified: Secondary | ICD-10-CM

## 2023-01-21 DIAGNOSIS — R7303 Prediabetes: Secondary | ICD-10-CM | POA: Diagnosis not present

## 2023-01-21 DIAGNOSIS — E782 Mixed hyperlipidemia: Secondary | ICD-10-CM | POA: Diagnosis not present

## 2023-01-21 DIAGNOSIS — D509 Iron deficiency anemia, unspecified: Secondary | ICD-10-CM

## 2023-01-21 DIAGNOSIS — I10 Essential (primary) hypertension: Secondary | ICD-10-CM

## 2023-01-21 MED ORDER — DILTIAZEM HCL ER COATED BEADS 120 MG PO CP24
120.0000 mg | ORAL_CAPSULE | Freq: Every day | ORAL | 1 refills | Status: DC
Start: 2023-01-21 — End: 2023-06-03

## 2023-01-21 NOTE — Assessment & Plan Note (Signed)
Vitals:   01/21/23 0935 01/21/23 0956  BP: (!) 145/76 135/78  Continue Hydrochlorothiazide 12,5 mg once daily Labs ordered in today's visit Continued discussion on DASH diet, low sodium diet and maintain a exercise routine for 150 minutes per week.

## 2023-01-21 NOTE — Progress Notes (Signed)
Patient Office Visit   Subjective   Patient ID: [name] [name], female    DOB: 12-26-1960  Age: 62 y.o. MRN: [id number]  CC:  Chief Complaint  Patient presents with   Hyperlipidemia   Hypertension    HPI [name] [name] 62 year old female, presents to the clinic for HTN follow up. She  has a past medical history of Anxiety (2008), Arthritis, Colon polyps, History of kidney stones (1999), History of migraine, History of stress test (2010), Hives of unknown origin, Hypertension, Kidney stones, Neuromuscular disorder (HCC), Obesity, unspecified, Sleep difficulties, and Syncope.  Hypertension This is a chronic problem. The problem has been gradually improving since onset. Pertinent negatives include no blurred vision, chest pain, headaches, palpitations, peripheral edema or shortness of breath. Risk factors for coronary artery disease include obesity, dyslipidemia and sedentary lifestyle. Past treatments include diuretics. The current treatment provides moderate improvement. Compliance problems include exercise.  There is no history of kidney disease. There is no history of chronic renal disease.      Outpatient Encounter Medications as of 01/21/2023  Medication Sig   atorvastatin (LIPITOR) 20 MG tablet Take 1 tablet (20 mg total) by mouth daily.   cetirizine (ZYRTEC) 10 MG tablet Take 2 tablets (20 mg total) by mouth 2 (two) times daily as needed for allergies (hives).   famotidine (PEPCID) 40 MG tablet Take 1 tablet (40 mg total) by mouth 2 (two) times daily as needed (hives).   hydrochlorothiazide (MICROZIDE) 12.5 MG capsule TAKE 1 CAPSULE BY MOUTH EVERY DAY   triamcinolone ointment (KENALOG) 0.5 % Apply 1 Application topically 2 (two) times daily.   [DISCONTINUED] diltiazem (CARDIZEM CD) 120 MG 24 hr capsule TAKE 1 CAPSULE BY MOUTH EVERY DAY   diltiazem (CARDIZEM CD) 120 MG 24 hr capsule Take 1 capsule (120 mg total) by mouth daily.   [DISCONTINUED] dicyclomine (BENTYL) 10 MG  capsule Take 1 capsule (10 mg total) by mouth 4 (four) times daily as needed for spasms. (Patient not taking: Reported on 12/13/2022)   [DISCONTINUED] hydrOXYzine (ATARAX) 10 MG tablet Take 1 tablet (10 mg total) by mouth at bedtime as needed for itching (hives). (Patient not taking: Reported on 12/13/2022)   [DISCONTINUED] omeprazole (PRILOSEC) 40 MG capsule Take 1 capsule (40 mg total) by mouth daily.   [DISCONTINUED] ondansetron (ZOFRAN) 4 MG tablet Take 1 tablet (4 mg total) by mouth 3 (three) times daily as needed for nausea or vomiting.   Facility-Administered Encounter Medications as of 01/21/2023  Medication   gadopentetate dimeglumine (MAGNEVIST) injection 20 mL    Past Surgical History:  Procedure Laterality Date   COLONOSCOPY  2016   DECOMPRESSIVE LUMBAR LAMINECTOMY LEVEL 1 N/A 10/28/2013   Procedure: DECOMPRESSION L4-5;  Surgeon: Venita Lick, MD;  Location: [facility] OR;  Service: Orthopedics;  Laterality: N/A;   KNEE ARTHROPLASTY Right 12/24/2018   Procedure: COMPUTER ASSISTED TOTAL KNEE ARTHROPLASTY;  Surgeon: Samson Frederic, MD;  Location: [facility];  Service: Orthopedics;  Laterality: Right;   KNEE ARTHROPLASTY Left 09/17/2019   Procedure: COMPUTER ASSISTED TOTAL KNEE ARTHROPLASTY;  Surgeon: Samson Frederic, MD;  Location: [facility];  Service: Orthopedics;  Laterality: Left;   KNEE ARTHROSCOPY  12/2008   left knee   KNEE ARTHROSCOPY Right 01/27/2018   PARTIAL HYSTERECTOMY  10/1994   thumb surgery Right 03/1975    Review of Systems  Constitutional:  Negative for chills and fever.  Eyes:  Negative for blurred vision.  Respiratory:  Negative for shortness of breath.   Cardiovascular:  Negative for chest pain and palpitations.  Genitourinary:  Negative for dysuria.  Musculoskeletal:  Negative for myalgias.  Neurological:  Negative for dizziness and headaches.      Objective    BP 135/78   Pulse 97   Resp 18   Ht 5' (1.524 m)   Wt 228 lb 12 oz (103.8 kg)   SpO2 97%   BMI 44.67  kg/m   Physical Exam Vitals reviewed.  Constitutional:      General: She is not in acute distress.    Appearance: Normal appearance. She is not ill-appearing, toxic-appearing or diaphoretic.  HENT:     Head: Normocephalic.  Eyes:     General:        Right eye: No discharge.        Left eye: No discharge.     Conjunctiva/sclera: Conjunctivae normal.  Cardiovascular:     Rate and Rhythm: Normal rate.     Pulses: Normal pulses.     Heart sounds: Normal heart sounds.  Pulmonary:     Effort: Pulmonary effort is normal. No respiratory distress.     Breath sounds: Normal breath sounds.  Musculoskeletal:        General: Normal range of motion.     Cervical back: Normal range of motion.  Skin:    General: Skin is warm.  Neurological:     General: No focal deficit present.     Mental Status: She is alert and oriented to person, place, and time.     Coordination: Coordination normal.     Gait: Gait normal.  Psychiatric:        Mood and Affect: Mood normal.        Behavior: Behavior normal.       Assessment & Plan:  Prediabetes -     Hemoglobin A1c  Mixed hyperlipidemia -     Lipid panel -     CBC with Differential/Platelet  Vitamin D deficiency -     VITAMIN D 25 Hydroxy (Vit-D Deficiency, Fractures)  Iron deficiency anemia, unspecified iron deficiency anemia type -     Iron, TIBC and Ferritin Panel -     B12 and Folate Panel  Primary hypertension Assessment & Plan: Vitals:   01/21/23 0935 01/21/23 0956  BP: (!) 145/76 135/78  Continue Hydrochlorothiazide 12,5 mg once daily Labs ordered in today's visit Continued discussion on DASH diet, low sodium diet and maintain a exercise routine for 150 minutes per week.   Orders: -     BMP8+eGFR  Bradycardia -     dilTIAZem HCl ER Coated Beads; Take 1 capsule (120 mg total) by mouth daily.  Dispense: 90 capsule; Refill: 1  Morbid obesity (HCC) Assessment & Plan: Discussed the importance to start eating 3 meals a day  including breakfast, drink 8 glasses of water a day ,reduce portion sizes. reduced carbohydrates limit saturated and trans fat, increase servings of vegetables and limit processed foods. Find an activity that you will enjoy and start to be active at least 5 days a week for 30 minutes each day. Keep a food journal or an activity journal to identify triggers that lead to emotional eating.      Return in about 4 months (around 05/23/2023), or if symptoms worsen or fail to improve, for chronic follow-up.   Cruzita Lederer Newman Nip, FNP

## 2023-01-21 NOTE — Assessment & Plan Note (Signed)
Discussed the importance to start eating 3 meals a day including breakfast, drink 8 glasses of water a day ,reduce portion sizes. reduced carbohydrates limit saturated and trans fat, increase servings of vegetables and limit processed foods. Find an activity that you will enjoy and start to be active at least 5 days a week for 30 minutes each day. Keep a food journal or an activity journal to identify triggers that lead to emotional eating

## 2023-01-21 NOTE — Patient Instructions (Signed)

## 2023-01-22 ENCOUNTER — Ambulatory Visit (INDEPENDENT_AMBULATORY_CARE_PROVIDER_SITE_OTHER): Payer: Medicare Other

## 2023-01-22 DIAGNOSIS — Z23 Encounter for immunization: Secondary | ICD-10-CM

## 2023-01-23 ENCOUNTER — Other Ambulatory Visit: Payer: Self-pay | Admitting: Family Medicine

## 2023-01-23 LAB — CBC WITH DIFFERENTIAL/PLATELET
Basophils Absolute: 0.1 10*3/uL (ref 0.0–0.2)
Basos: 1 %
EOS (ABSOLUTE): 0.3 10*3/uL (ref 0.0–0.4)
Eos: 3 %
Hematocrit: 38 % (ref 34.0–46.6)
Hemoglobin: 11.5 g/dL (ref 11.1–15.9)
Immature Grans (Abs): 0 10*3/uL (ref 0.0–0.1)
Immature Granulocytes: 0 %
Lymphocytes Absolute: 2.4 10*3/uL (ref 0.7–3.1)
Lymphs: 28 %
MCH: 25.3 pg — ABNORMAL LOW (ref 26.6–33.0)
MCHC: 30.3 g/dL — ABNORMAL LOW (ref 31.5–35.7)
MCV: 84 fL (ref 79–97)
Monocytes Absolute: 0.6 10*3/uL (ref 0.1–0.9)
Monocytes: 7 %
Neutrophils Absolute: 5.2 10*3/uL (ref 1.4–7.0)
Neutrophils: 61 %
Platelets: 482 10*3/uL — ABNORMAL HIGH (ref 150–450)
RBC: 4.55 x10E6/uL (ref 3.77–5.28)
RDW: 15.8 % — ABNORMAL HIGH (ref 11.7–15.4)
WBC: 8.5 10*3/uL (ref 3.4–10.8)

## 2023-01-23 LAB — BMP8+EGFR
BUN/Creatinine Ratio: 14 (ref 12–28)
BUN: 11 mg/dL (ref 8–27)
CO2: 24 mmol/L (ref 20–29)
Calcium: 9 mg/dL (ref 8.7–10.3)
Chloride: 104 mmol/L (ref 96–106)
Creatinine, Ser: 0.78 mg/dL (ref 0.57–1.00)
Glucose: 78 mg/dL (ref 70–99)
Potassium: 3.9 mmol/L (ref 3.5–5.2)
Sodium: 141 mmol/L (ref 134–144)
eGFR: 85 mL/min/{1.73_m2} (ref >59)

## 2023-01-23 LAB — IRON,TIBC AND FERRITIN PANEL
Ferritin: 16 ng/mL (ref 15–150)
Iron Saturation: 5 % — CL (ref 15–55)
Iron: 19 ug/dL — ABNORMAL LOW (ref 27–139)
Total Iron Binding Capacity: 381 ug/dL (ref 250–450)
UIBC: 362 ug/dL (ref 118–369)

## 2023-01-23 LAB — LIPID PANEL
Chol/HDL Ratio: 2.7 ratio (ref 0.0–4.4)
Cholesterol, Total: 126 mg/dL (ref 100–199)
HDL: 46 mg/dL (ref >39)
LDL Chol Calc (NIH): 62 mg/dL (ref 0–99)
Triglycerides: 96 mg/dL (ref 0–149)
VLDL Cholesterol Cal: 18 mg/dL (ref 5–40)

## 2023-01-23 LAB — HEMOGLOBIN A1C
Est. average glucose Bld gHb Est-mCnc: 120 mg/dL
Hgb A1c MFr Bld: 5.8 % — ABNORMAL HIGH (ref 4.8–5.6)

## 2023-01-23 LAB — B12 AND FOLATE PANEL
Folate: 8.2 ng/mL (ref >3.0)
Vitamin B-12: 499 pg/mL (ref 232–1245)

## 2023-01-23 LAB — VITAMIN D 25 HYDROXY (VIT D DEFICIENCY, FRACTURES): Vit D, 25-Hydroxy: 55.8 ng/mL (ref 30.0–100.0)

## 2023-01-23 MED ORDER — POLYSACCHARIDE IRON COMPLEX 150 MG PO CAPS: 150.0000 mg | ORAL_CAPSULE | Freq: Every day | ORAL | 2 refills | Status: DC

## 2023-01-24 ENCOUNTER — Encounter: Payer: Self-pay | Admitting: Family Medicine

## 2023-01-30 NOTE — Patient Instructions (Signed)
Your procedure is scheduled on: 02/04/2023  Report to [facility] Main Entrance at  6:00   AM.  Call this number if you have problems the morning of surgery: [phone]   Remember:   Do not Eat or Drink after midnight         No Smoking the morning of surgery  :  Take these medicines the morning of surgery with A SIP OF WATER: Zyrtec, Diltiazem and pepcid   Do not wear jewelry, make-up or nail polish.  Do not wear lotions, powders, or perfumes. You may wear deodorant.  Do not shave 48 hours prior to surgery. Men may shave face and neck.  Do not bring valuables to the hospital.  Contacts, dentures or bridgework may not be worn into surgery.  Leave suitcase in the car. After surgery it may be brought to your room.  For patients admitted to the hospital, checkout time is 11:00 AM the day of discharge.   Patients discharged the day of surgery will not be allowed to drive home.    Special Instructions: Shower using CHG night before surgery and shower the day of surgery use CHG.  Use special wash - you have one bottle of CHG for all showers.  You should use approximately 1/2 of the bottle for each shower. How to Use Chlorhexidine Before Surgery Chlorhexidine gluconate (CHG) is a germ-killing (antiseptic) solution that is used to clean the skin. It can get rid of the bacteria that normally live on the skin and can keep them away for about 24 hours. To clean your skin with CHG, you may be given: A CHG solution to use in the shower or as part of a sponge bath. A prepackaged cloth that contains CHG. Cleaning your skin with CHG may help lower the risk for infection: While you are staying in the intensive care unit of the hospital. If you have a vascular access, such as a central line, to provide short-term or long-term access to your veins. If you have a catheter to drain urine from your bladder. If you are on a ventilator. A ventilator is a machine that helps you breathe by moving air in and  out of your lungs. After surgery. What are the risks? Risks of using CHG include: A skin reaction. Hearing loss, if CHG gets in your ears and you have a perforated eardrum. Eye injury, if CHG gets in your eyes and is not rinsed out. The CHG product catching fire. Make sure that you avoid smoking and flames after applying CHG to your skin. Do not use CHG: If you have a chlorhexidine allergy or have previously reacted to chlorhexidine. On babies younger than 85 months of age. How to use CHG solution Use CHG only as told by your health care provider, and follow the instructions on the label. Use the full amount of CHG as directed. Usually, this is one bottle. During a shower Follow these steps when using CHG solution during a shower (unless your health care provider gives you different instructions): Start the shower. Use your normal soap and shampoo to wash your face and hair. Turn off the shower or move out of the shower stream. Pour the CHG onto a clean washcloth. Do not use any type of brush or rough-edged sponge. Starting at your neck, lather your body down to your toes. Make sure you follow these instructions: If you will be having surgery, pay special attention to the part of your body where you will be having  surgery. Scrub this area for at least 1 minute. Do not use CHG on your head or face. If the solution gets into your ears or eyes, rinse them well with water. Avoid your genital area. Avoid any areas of skin that have broken skin, cuts, or scrapes. Scrub your back and under your arms. Make sure to wash skin folds. Let the lather sit on your skin for 1-2 minutes or as long as told by your health care provider. Thoroughly rinse your entire body in the shower. Make sure that all body creases and crevices are rinsed well. Dry off with a clean towel. Do not put any substances on your body afterward--such as powder, lotion, or perfume--unless you are told to do so by your health care  provider. Only use lotions that are recommended by the manufacturer. Put on clean clothes or pajamas. If it is the night before your surgery, sleep in clean sheets.  During a sponge bath Follow these steps when using CHG solution during a sponge bath (unless your health care provider gives you different instructions): Use your normal soap and shampoo to wash your face and hair. Pour the CHG onto a clean washcloth. Starting at your neck, lather your body down to your toes. Make sure you follow these instructions: If you will be having surgery, pay special attention to the part of your body where you will be having surgery. Scrub this area for at least 1 minute. Do not use CHG on your head or face. If the solution gets into your ears or eyes, rinse them well with water. Avoid your genital area. Avoid any areas of skin that have broken skin, cuts, or scrapes. Scrub your back and under your arms. Make sure to wash skin folds. Let the lather sit on your skin for 1-2 minutes or as long as told by your health care provider. Using a different clean, wet washcloth, thoroughly rinse your entire body. Make sure that all body creases and crevices are rinsed well. Dry off with a clean towel. Do not put any substances on your body afterward--such as powder, lotion, or perfume--unless you are told to do so by your health care provider. Only use lotions that are recommended by the manufacturer. Put on clean clothes or pajamas. If it is the night before your surgery, sleep in clean sheets. How to use CHG prepackaged cloths Only use CHG cloths as told by your health care provider, and follow the instructions on the label. Use the CHG cloth on clean, dry skin. Do not use the CHG cloth on your head or face unless your health care provider tells you to. When washing with the CHG cloth: Avoid your genital area. Avoid any areas of skin that have broken skin, cuts, or scrapes. Before surgery Follow these steps  when using a CHG cloth to clean before surgery (unless your health care provider gives you different instructions): Using the CHG cloth, vigorously scrub the part of your body where you will be having surgery. Scrub using a back-and-forth motion for 3 minutes. The area on your body should be completely wet with CHG when you are done scrubbing. Do not rinse. Discard the cloth and let the area air-dry. Do not put any substances on the area afterward, such as powder, lotion, or perfume. Put on clean clothes or pajamas. If it is the night before your surgery, sleep in clean sheets.  For general bathing Follow these steps when using CHG cloths for general bathing (unless your health  care provider gives you different instructions). Use a separate CHG cloth for each area of your body. Make sure you wash between any folds of skin and between your fingers and toes. Wash your body in the following order, switching to a new cloth after each step: The front of your neck, shoulders, and chest. Both of your arms, under your arms, and your hands. Your stomach and groin area, avoiding the genitals. Your right leg and foot. Your left leg and foot. The back of your neck, your back, and your buttocks. Do not rinse. Discard the cloth and let the area air-dry. Do not put any substances on your body afterward--such as powder, lotion, or perfume--unless you are told to do so by your health care provider. Only use lotions that are recommended by the manufacturer. Put on clean clothes or pajamas. Contact a health care provider if: Your skin gets irritated after scrubbing. You have questions about using your solution or cloth. You swallow any chlorhexidine. Call your local poison control center [phone] [phone] in the [place] Get help right away if: Your eyes itch badly, or they become very red or swollen. Your skin itches badly and is red or swollen. Your hearing changes. You have trouble seeing. You have swelling or  tingling in your mouth or throat. You have trouble breathing. These symptoms may represent a serious problem that is an emergency. Do not wait to see if the symptoms will go away. Get medical help right away. Call your local emergency services (911 in the [place] Do not drive yourself to the hospital. Summary Chlorhexidine gluconate (CHG) is a germ-killing (antiseptic) solution that is used to clean the skin. Cleaning your skin with CHG may help to lower your risk for infection. You may be given CHG to use for bathing. It may be in a bottle or in a prepackaged cloth to use on your skin. Carefully follow your health care provider's instructions and the instructions on the product label. Do not use CHG if you have a chlorhexidine allergy. Contact your health care provider if your skin gets irritated after scrubbing. This information is not intended to replace advice given to you by your health care provider. Make sure you discuss any questions you have with your health care provider. Document Revised: 08/14/2021 Document Reviewed: 06/27/2020 Elsevier Patient Education  2023 [employer] Minimally Invasive Cholecystectomy, Care After The following information offers guidance on how to care for yourself after your procedure. Your health care provider may also give you more specific instructions. If you have problems or questions, contact your health care provider. What can I expect after the procedure? After the procedure, it is common to have: Pain at your incision sites. You will be given medicines to control this pain. Mild nausea or vomiting. Bloating and possible shoulder pain from the gas that was used during the procedure. Follow these instructions at home: Medicines Take over-the-counter and prescription medicines only as told by your health care provider. If you were prescribed an antibiotic medicine, take it as told by your health care provider. Do not stop using the antibiotic even if  you start to feel better. Ask your health care provider if the medicine prescribed to you: Requires you to avoid driving or using machinery. Can cause constipation. You may need to take these actions to prevent or treat constipation: Drink enough fluid to keep your urine pale yellow. Take over-the-counter or prescription medicines. Eat foods that are high in fiber, such as beans, whole grains, and fresh  fruits and vegetables. Limit foods that are high in fat and processed sugars, such as fried or sweet foods. Incision care  Follow instructions from your health care provider about how to take care of your incisions. Make sure you: Wash your hands with soap and water for at least 20 seconds before and after you change your bandage (dressing). If soap and water are not available, use hand sanitizer. Change your dressing as told by your health care provider. Leave stitches (sutures), skin glue, or adhesive strips in place. These skin closures may need to be in place for 2 weeks or longer. If adhesive strip edges start to loosen and curl up, you may trim the loose edges. Do not remove adhesive strips completely unless your health care provider tells you to do that. Do not take baths, swim, or use a hot tub until your health care provider approves. Ask your health care provider if you may take showers. You may only be allowed to take sponge baths. Check your incision area every day for signs of infection. Check for: More redness, swelling, or pain. Fluid or blood. Warmth. Pus or a bad smell. Activity Rest as told by your health care provider. Do not do activities that require a lot of effort. Avoid sitting for a long time without moving. Get up to take short walks every 1-2 hours. This is important to improve blood flow and breathing. Ask for help if you feel weak or unsteady. Do not lift anything that is heavier than 10 lb (4.5 kg), or the limit that you are told, until your health care provider  says that it is safe. Do not play contact sports until your health care provider approves. Do not return to work or school until your health care provider approves. Return to your normal activities as told by your health care provider. Ask your health care provider what activities are safe for you. General instructions If you were given a sedative during the procedure, it can affect you for several hours. Do not drive or operate machinery until your health care provider says that it is safe. Keep all follow-up visits. This is important. Contact a health care provider if: You develop a rash. You have more redness, swelling, or pain around your incisions. You have fluid or blood coming from your incisions. Your incisions feel warm to the touch. You have pus or a bad smell coming from your incisions. You have a fever. One or more of your incisions breaks open. Get help right away if: You have trouble breathing. You have chest pain. You have more pain in your shoulders. You faint or feel dizzy when you stand. You have severe pain in your abdomen. You have nausea or vomiting that lasts for more than one day. You have leg pain that is new or unusual, or if it is localized to one specific spot. These symptoms may represent a serious problem that is an emergency. Do not wait to see if the symptoms will go away. Get medical help right away. Call your local emergency services (911 in the [place] Do not drive yourself to the hospital. Summary After your procedure, it is common to have pain at the incision sites. You may also have nausea or bloating. Follow your health care provider's instructions about medicine, activity restrictions, and caring for your incision areas. Do not do activities that require a lot of effort. Contact a health care provider if you have a fever or other signs of infection, such as  more redness, swelling, or pain around the incisions. Get help right away if you have chest  pain, increasing pain in the shoulders, or trouble breathing. This information is not intended to replace advice given to you by your health care provider. Make sure you discuss any questions you have with your health care provider. Document Revised: 10/18/2020 Document Reviewed: 10/18/2020 Elsevier Patient Education  2024 [employer] General Anesthesia, Adult, Care After The following information offers guidance on how to care for yourself after your procedure. Your health care provider may also give you more specific instructions. If you have problems or questions, contact your health care provider. What can I expect after the procedure? After the procedure, it is common for people to: Have pain or discomfort at the IV site. Have nausea or vomiting. Have a sore throat or hoarseness. Have trouble concentrating. Feel cold or chills. Feel weak, sleepy, or tired (fatigue). Have soreness and body aches. These can affect parts of the body that were not involved in surgery. Follow these instructions at home: For the time period you were told by your health care provider:  Rest. Do not participate in activities where you could fall or become injured. Do not drive or use machinery. Do not drink alcohol. Do not take sleeping pills or medicines that cause drowsiness. Do not make important decisions or sign legal documents. Do not take care of children on your own. General instructions Drink enough fluid to keep your urine pale yellow. If you have sleep apnea, surgery and certain medicines can increase your risk for breathing problems. Follow instructions from your health care provider about wearing your sleep device: Anytime you are sleeping, including during daytime naps. While taking prescription pain medicines, sleeping medicines, or medicines that make you drowsy. Return to your normal activities as told by your health care provider. Ask your health care provider what activities are safe  for you. Take over-the-counter and prescription medicines only as told by your health care provider. Do not use any products that contain nicotine or tobacco. These products include cigarettes, chewing tobacco, and vaping devices, such as e-cigarettes. These can delay incision healing after surgery. If you need help quitting, ask your health care provider. Contact a health care provider if: You have nausea or vomiting that does not get better with medicine. You vomit every time you eat or drink. You have pain that does not get better with medicine. You cannot urinate or have bloody urine. You develop a skin rash. You have a fever. Get help right away if: You have trouble breathing. You have chest pain. You vomit blood. These symptoms may be an emergency. Get help right away. Call 911. Do not wait to see if the symptoms will go away. Do not drive yourself to the hospital. Summary After the procedure, it is common to have a sore throat, hoarseness, nausea, vomiting, or to feel weak, sleepy, or fatigue. For the time period you were told by your health care provider, do not drive or use machinery. Get help right away if you have difficulty breathing, have chest pain, or vomit blood. These symptoms may be an emergency. This information is not intended to replace advice given to you by your health care provider. Make sure you discuss any questions you have with your health care provider. Document Revised: 07/14/2021 Document Reviewed: 07/14/2021 Elsevier Patient Education  2024 [employer].

## 2023-01-31 ENCOUNTER — Encounter (HOSPITAL_COMMUNITY)
Admission: RE | Admit: 2023-01-31 | Discharge: 2023-01-31 | Disposition: A | Discharge status: Home / Self Care | Payer: Medicare Other | Source: Ambulatory Visit | Attending: General Surgery | Admitting: General Surgery

## 2023-01-31 VITALS — BP 135/78 | HR 97 | Temp 97.8°F | Resp 18 | Ht 60.0 in | Wt 228.7 lb

## 2023-01-31 DIAGNOSIS — Z01810 Encounter for preprocedural cardiovascular examination: Secondary | ICD-10-CM | POA: Diagnosis present

## 2023-01-31 DIAGNOSIS — I10 Essential (primary) hypertension: Secondary | ICD-10-CM | POA: Insufficient documentation

## 2023-02-04 ENCOUNTER — Encounter (HOSPITAL_COMMUNITY)
Admission: RE | Disposition: A | Discharge status: Home / Self Care | Payer: Self-pay | Source: Home / Self Care | Attending: General Surgery

## 2023-02-04 ENCOUNTER — Encounter (HOSPITAL_COMMUNITY): Payer: Self-pay | Admitting: General Surgery

## 2023-02-04 ENCOUNTER — Other Ambulatory Visit: Payer: Self-pay

## 2023-02-04 ENCOUNTER — Ambulatory Visit (HOSPITAL_COMMUNITY): Payer: Medicare Other | Admitting: Certified Registered"

## 2023-02-04 ENCOUNTER — Ambulatory Visit (HOSPITAL_BASED_OUTPATIENT_CLINIC_OR_DEPARTMENT_OTHER): Payer: Medicare Other | Admitting: Certified Registered"

## 2023-02-04 ENCOUNTER — Ambulatory Visit (HOSPITAL_COMMUNITY)
Admission: RE | Admit: 2023-02-04 | Discharge: 2023-02-04 | Disposition: A | Discharge status: Home / Self Care | Payer: Medicare Other | Attending: General Surgery | Admitting: General Surgery

## 2023-02-04 DIAGNOSIS — G4730 Sleep apnea, unspecified: Secondary | ICD-10-CM | POA: Diagnosis not present

## 2023-02-04 DIAGNOSIS — Z6841 Body Mass Index (BMI) 40.0 and over, adult: Secondary | ICD-10-CM | POA: Diagnosis not present

## 2023-02-04 DIAGNOSIS — K811 Chronic cholecystitis: Secondary | ICD-10-CM | POA: Diagnosis not present

## 2023-02-04 DIAGNOSIS — I10 Essential (primary) hypertension: Secondary | ICD-10-CM | POA: Diagnosis not present

## 2023-02-04 DIAGNOSIS — K8044 Calculus of bile duct with chronic cholecystitis without obstruction: Secondary | ICD-10-CM | POA: Diagnosis present

## 2023-02-04 SURGERY — CHOLECYSTECTOMY, ROBOT-ASSISTED, LAPAROSCOPIC
Anesthesia: General

## 2023-02-04 MED ORDER — INDOCYANINE GREEN 25 MG IV SOLR: 2.5000 mg | Freq: Once | INTRAVENOUS | Status: AC

## 2023-02-04 MED ORDER — BUPIVACAINE HCL (PF) 0.5 % IJ SOLN
INTRAMUSCULAR | Status: AC
  Filled 2023-02-04: qty 30

## 2023-02-04 MED ORDER — KETOROLAC TROMETHAMINE 30 MG/ML IJ SOLN
INTRAMUSCULAR | Status: AC
  Filled 2023-02-04: qty 1

## 2023-02-04 MED ORDER — BUPIVACAINE HCL (PF) 0.5 % IJ SOLN
INTRAMUSCULAR | Status: DC | PRN
  Administered 2023-02-04: 30 mL

## 2023-02-04 MED ORDER — ROCURONIUM BROMIDE 10 MG/ML (PF) SYRINGE
PREFILLED_SYRINGE | INTRAVENOUS | Status: AC
  Filled 2023-02-04: qty 10

## 2023-02-04 MED ORDER — FENTANYL CITRATE PF 50 MCG/ML IJ SOSY: 25.0000 ug | PREFILLED_SYRINGE | INTRAMUSCULAR | Status: DC | PRN

## 2023-02-04 MED ORDER — ONDANSETRON HCL 4 MG/2ML IJ SOLN
INTRAMUSCULAR | Status: AC
  Filled 2023-02-04: qty 2

## 2023-02-04 MED ORDER — CHLORHEXIDINE GLUCONATE 0.12 % MT SOLN
15.0000 mL | Freq: Once | OROMUCOSAL | Status: AC
  Administered 2023-02-04: 15 mL via OROMUCOSAL

## 2023-02-04 MED ORDER — STERILE WATER FOR IRRIGATION IR SOLN
Status: DC | PRN
  Administered 2023-02-04: 500 mL

## 2023-02-04 MED ORDER — PROPOFOL 10 MG/ML IV BOLUS
INTRAVENOUS | Status: DC | PRN
  Administered 2023-02-04: 200 mg via INTRAVENOUS

## 2023-02-04 MED ORDER — OXYCODONE HCL 5 MG PO TABS
5.0000 mg | ORAL_TABLET | Freq: Four times a day (QID) | ORAL | 0 refills | Status: DC | PRN
Start: 2023-02-04 — End: 2023-05-23

## 2023-02-04 MED ORDER — CHLORHEXIDINE GLUCONATE CLOTH 2 % EX PADS: 6.0000 | MEDICATED_PAD | Freq: Once | CUTANEOUS | Status: DC

## 2023-02-04 MED ORDER — DEXAMETHASONE SODIUM PHOSPHATE 10 MG/ML IJ SOLN
INTRAMUSCULAR | Status: AC
  Filled 2023-02-04: qty 1

## 2023-02-04 MED ORDER — VANCOMYCIN HCL IN DEXTROSE 1-5 GM/200ML-% IV SOLN: 1000.0000 mg | Freq: Once | INTRAVENOUS | Status: DC

## 2023-02-04 MED ORDER — SUGAMMADEX SODIUM 200 MG/2ML IV SOLN
INTRAVENOUS | Status: DC | PRN
  Administered 2023-02-04: 300 mg via INTRAVENOUS

## 2023-02-04 MED ORDER — MIDAZOLAM HCL 2 MG/2ML IJ SOLN
INTRAMUSCULAR | Status: DC | PRN
  Administered 2023-02-04: 2 mg via INTRAVENOUS

## 2023-02-04 MED ORDER — FENTANYL CITRATE (PF) 250 MCG/5ML IJ SOLN
INTRAMUSCULAR | Status: AC
  Filled 2023-02-04: qty 5

## 2023-02-04 MED ORDER — ONDANSETRON HCL 4 MG/2ML IJ SOLN
INTRAMUSCULAR | Status: DC | PRN
  Administered 2023-02-04: 4 mg via INTRAVENOUS

## 2023-02-04 MED ORDER — INDOCYANINE GREEN 25 MG IV SOLR
INTRAVENOUS | Status: AC
  Administered 2023-02-04: 2.5 mg via INTRAVENOUS
  Filled 2023-02-04: qty 10

## 2023-02-04 MED ORDER — VANCOMYCIN HCL 1500 MG/300ML IV SOLN
1500.0000 mg | INTRAVENOUS | Status: DC
  Administered 2023-02-04: 1500 mg via INTRAVENOUS
  Filled 2023-02-04: qty 300

## 2023-02-04 MED ORDER — ROCURONIUM BROMIDE 10 MG/ML (PF) SYRINGE
PREFILLED_SYRINGE | INTRAVENOUS | Status: DC | PRN
  Administered 2023-02-04: 50 mg via INTRAVENOUS

## 2023-02-04 MED ORDER — VANCOMYCIN HCL 500 MG/100ML IV SOLN
500.0000 mg | Freq: Once | INTRAVENOUS | Status: DC
  Filled 2023-02-04: qty 100

## 2023-02-04 MED ORDER — LACTATED RINGERS IV SOLN: INTRAVENOUS | Status: DC

## 2023-02-04 MED ORDER — SEVOFLURANE IN SOLN
RESPIRATORY_TRACT | Status: AC
  Filled 2023-02-04: qty 250

## 2023-02-04 MED ORDER — VANCOMYCIN HCL IN DEXTROSE 1-5 GM/200ML-% IV SOLN
INTRAVENOUS | Status: AC
  Filled 2023-02-04: qty 200

## 2023-02-04 MED ORDER — PROPOFOL 10 MG/ML IV BOLUS
INTRAVENOUS | Status: AC
  Filled 2023-02-04: qty 20

## 2023-02-04 MED ORDER — LIDOCAINE 2% (20 MG/ML) 5 ML SYRINGE
INTRAMUSCULAR | Status: DC | PRN
  Administered 2023-02-04: 100 mg via INTRAVENOUS

## 2023-02-04 MED ORDER — LIDOCAINE HCL (PF) 2 % IJ SOLN
INTRAMUSCULAR | Status: AC
  Filled 2023-02-04: qty 5

## 2023-02-04 MED ORDER — DEXMEDETOMIDINE HCL IN NACL 80 MCG/20ML IV SOLN
INTRAVENOUS | Status: DC | PRN
Start: 2023-02-04 — End: 2023-02-04
  Administered 2023-02-04: 12 ug via INTRAVENOUS
  Administered 2023-02-04: 8 ug via INTRAVENOUS
  Administered 2023-02-04: 12 ug via INTRAVENOUS
  Administered 2023-02-04: 8 ug via INTRAVENOUS

## 2023-02-04 MED ORDER — FENTANYL CITRATE (PF) 250 MCG/5ML IJ SOLN
INTRAMUSCULAR | Status: DC | PRN
  Administered 2023-02-04 (×5): 50 ug via INTRAVENOUS

## 2023-02-04 MED ORDER — KETOROLAC TROMETHAMINE 30 MG/ML IJ SOLN
INTRAMUSCULAR | Status: DC | PRN
Start: 2023-02-04 — End: 2023-02-04
  Administered 2023-02-04: 30 mg via INTRAVENOUS

## 2023-02-04 MED ORDER — DEXAMETHASONE SODIUM PHOSPHATE 4 MG/ML IJ SOLN
INTRAMUSCULAR | Status: DC | PRN
  Administered 2023-02-04: 5 mg via INTRAVENOUS

## 2023-02-04 MED ORDER — SODIUM CHLORIDE 0.9 % IR SOLN
Status: DC | PRN
Start: 2023-02-04 — End: 2023-02-04
  Administered 2023-02-04: 3000 mL

## 2023-02-04 MED ORDER — OXYCODONE HCL 5 MG PO TABS: 5.0000 mg | ORAL_TABLET | Freq: Once | ORAL | Status: DC | PRN

## 2023-02-04 MED ORDER — ONDANSETRON HCL 4 MG/2ML IJ SOLN: 4.0000 mg | Freq: Once | INTRAMUSCULAR | Status: DC | PRN

## 2023-02-04 MED ORDER — CHLORHEXIDINE GLUCONATE 0.12 % MT SOLN: 15.0000 mL | Freq: Once | OROMUCOSAL | Status: DC

## 2023-02-04 MED ORDER — ORAL CARE MOUTH RINSE: 15.0000 mL | Freq: Once | OROMUCOSAL | Status: DC

## 2023-02-04 MED ORDER — OXYCODONE HCL 5 MG/5ML PO SOLN: 5.0000 mg | Freq: Once | ORAL | Status: DC | PRN

## 2023-02-04 MED ORDER — MIDAZOLAM HCL 2 MG/2ML IJ SOLN
INTRAMUSCULAR | Status: AC
  Filled 2023-02-04: qty 2

## 2023-02-04 SURGICAL SUPPLY — 44 items
ADH SKN CLS APL DERMABOND .7 (GAUZE/BANDAGES/DRESSINGS) ×1
APL PRP STRL LF DISP 70% ISPRP (MISCELLANEOUS) ×1
CAUTERY HOOK MNPLR 1.6 DVNC XI (INSTRUMENTS) ×1 IMPLANT
CHLORAPREP W/TINT 26 (MISCELLANEOUS) ×1 IMPLANT
CLIP LIGATING HEM O LOK PURPLE (MISCELLANEOUS) ×1 IMPLANT
COVER LIGHT HANDLE STERIS (MISCELLANEOUS) IMPLANT
COVER TIP SHEARS 8 DVNC (MISCELLANEOUS) IMPLANT
DEFOGGER SCOPE WARMER CLEARIFY (MISCELLANEOUS) IMPLANT
DERMABOND ADVANCED .7 DNX12 (GAUZE/BANDAGES/DRESSINGS) ×1 IMPLANT
DRAPE ARM DVNC X/XI (DISPOSABLE) ×4 IMPLANT
DRAPE COLUMN DVNC XI (DISPOSABLE) ×1 IMPLANT
ELECT REM PT RETURN 9FT ADLT (ELECTROSURGICAL) ×1
ELECTRODE REM PT RTRN 9FT ADLT (ELECTROSURGICAL) ×1 IMPLANT
FORCEPS BPLR R/ABLATION 8 DVNC (INSTRUMENTS) ×1 IMPLANT
FORCEPS PROGRASP DVNC XI (FORCEP) ×1 IMPLANT
GLOVE BIO SURGEON STRL SZ7 (GLOVE) IMPLANT
GLOVE BIOGEL PI IND STRL 7.0 (GLOVE) ×2 IMPLANT
GLOVE SURG SS PI 7.5 STRL IVOR (GLOVE) ×2 IMPLANT
GOWN STRL REUS W/TWL LRG LVL3 (GOWN DISPOSABLE) ×3 IMPLANT
GRASPER SUT TROCAR 14GX15 (MISCELLANEOUS) IMPLANT
IRRIGATOR SUCT 8 DISP DVNC XI (IRRIGATION / IRRIGATOR) IMPLANT
IV NS IRRIG 3000ML ARTHROMATIC (IV SOLUTION) IMPLANT
KIT TURNOVER KIT A (KITS) ×1 IMPLANT
MANIFOLD NEPTUNE II (INSTRUMENTS) ×1 IMPLANT
NDL HYPO 21X1.5 SAFETY (NEEDLE) ×1 IMPLANT
NDL INSUFFLATION 14GA 120MM (NEEDLE) ×1 IMPLANT
NEEDLE HYPO 21X1.5 SAFETY (NEEDLE) ×1
NEEDLE INSUFFLATION 14GA 120MM (NEEDLE) ×1
OBTURATOR OPTICAL STND 8 DVNC (TROCAR) ×1
OBTURATOR OPTICALSTD 8 DVNC (TROCAR) ×1 IMPLANT
PACK LAP CHOLE LZT030E (CUSTOM PROCEDURE TRAY) ×1 IMPLANT
PAD ARMBOARD 7.5X6 YLW CONV (MISCELLANEOUS) ×1 IMPLANT
PENCIL HANDSWITCHING (ELECTRODE) ×1 IMPLANT
POSITIONER HEAD 8X9X4 ADT (SOFTGOODS) ×1 IMPLANT
SCISSORS MNPLR CVD DVNC XI (INSTRUMENTS) IMPLANT
SEAL UNIV 5-12 XI (MISCELLANEOUS) ×4 IMPLANT
SET BASIN LINEN APH (SET/KITS/TRAYS/PACK) ×1 IMPLANT
SET TUBE DA VINCI INSUFFLATOR (TUBING) IMPLANT
SUT MNCRL AB 4-0 PS2 18 (SUTURE) ×2 IMPLANT
SUT VICRYL 0 AB UR-6 (SUTURE) ×1 IMPLANT
SYR 30ML LL (SYRINGE) ×1 IMPLANT
SYS RETRIEVAL 5MM INZII UNIV (BASKET) ×1
SYSTEM RETRIEVL 5MM INZII UNIV (BASKET) ×1 IMPLANT
WATER STERILE IRR 500ML POUR (IV SOLUTION) ×1 IMPLANT

## 2023-02-04 NOTE — Interval H&P Note (Signed)
History and Physical Interval Note:  02/04/2023 7:13 AM  [name] [name]  has presented today for surgery, with the diagnosis of Chronic cholecystitis.  The various methods of treatment have been discussed with the patient and family. After consideration of risks, benefits and other options for treatment, the patient has consented to  Procedure(s): XI ROBOTIC ASSISTED LAPAROSCOPIC CHOLECYSTECTOMY (N/A) as a surgical intervention.  The patient's history has been reviewed, patient examined, no change in status, stable for surgery.  I have reviewed the patient's chart and labs.  Questions were answered to the patient's satisfaction.     Franky Macho

## 2023-02-04 NOTE — Transfer of Care (Signed)
Immediate Anesthesia Transfer of Care Note  Patient: [name]  Procedure(s) Performed: XI ROBOTIC ASSISTED LAPAROSCOPIC CHOLECYSTECTOMY  Patient Location: PACU  Anesthesia Type:General  Level of Consciousness: drowsy  Airway & Oxygen Therapy: Patient Spontanous Breathing and Patient connected to face mask oxygen  Post-op Assessment: Report given to RN and Post -op Vital signs reviewed and stable  Post vital signs: Reviewed and stable  Last Vitals:  Vitals Value Taken Time  BP    Temp    Pulse 46 02/04/23 0841  Resp 8 02/04/23 0841  SpO2 98 % 02/04/23 0841  Vitals shown include unfiled device data.  Last Pain:  Vitals:   02/04/23 0651  PainSc: 0-No pain         Complications: No notable events documented.

## 2023-02-04 NOTE — Anesthesia Procedure Notes (Signed)
Procedure Name: Intubation Date/Time: 02/04/2023 7:38 AM  Performed by: Julian Reil, CRNAPre-anesthesia Checklist: Patient identified, Emergency Drugs available, Suction available and Patient being monitored Patient Re-evaluated:Patient Re-evaluated prior to induction Oxygen Delivery Method: Circle system utilized Preoxygenation: Pre-oxygenation with 100% oxygen Induction Type: IV induction Ventilation: Mask ventilation without difficulty Laryngoscope Size: Miller and 3 Grade View: Grade II Tube type: Oral Tube size: 7.5 mm Number of attempts: 1 Airway Equipment and Method: Stylet Placement Confirmation: ETT inserted through vocal cords under direct vision, positive ETCO2 and breath sounds checked- equal and bilateral Secured at: 23 cm Tube secured with: Tape Dental Injury: Teeth and Oropharynx as per pre-operative assessment

## 2023-02-04 NOTE — Anesthesia Preprocedure Evaluation (Addendum)
Anesthesia Evaluation  Patient identified by MRN, date of birth, ID band Patient awake    Reviewed: Allergy & Precautions, H&P , NPO status , Patient's Chart, lab work & pertinent test results, reviewed documented beta blocker date and time   Airway Mallampati: II  TM Distance: >3 FB Neck ROM: full    Dental no notable dental hx.    Pulmonary neg pulmonary ROS, sleep apnea    Pulmonary exam normal breath sounds clear to auscultation       Cardiovascular Exercise Tolerance: Good hypertension,  Rhythm:regular Rate:Normal     Neuro/Psych  Headaches  Anxiety      Neuromuscular disease negative neurological ROS  negative psych ROS   GI/Hepatic negative GI ROS, Neg liver ROS,,,  Endo/Other    Morbid obesity  Renal/GU Renal diseasenegative Renal ROS  negative genitourinary   Musculoskeletal   Abdominal   Peds  Hematology negative hematology ROS (+)   Anesthesia Other Findings   Reproductive/Obstetrics negative OB ROS                             Anesthesia Physical Anesthesia Plan  ASA: 3  Anesthesia Plan: General and General ETT   Post-op Pain Management:    Induction:   PONV Risk Score and Plan: Ondansetron  Airway Management Planned:   Additional Equipment:   Intra-op Plan:   Post-operative Plan:   Informed Consent: I have reviewed the patients History and Physical, chart, labs and discussed the procedure including the risks, benefits and alternatives for the proposed anesthesia with the patient or authorized representative who has indicated his/her understanding and acceptance.     Dental Advisory Given  Plan Discussed with: CRNA  Anesthesia Plan Comments:        Anesthesia Quick Evaluation

## 2023-02-04 NOTE — Anesthesia Postprocedure Evaluation (Signed)
Anesthesia Post Note  Patient: [name] [name]  Procedure(s) Performed: XI ROBOTIC ASSISTED LAPAROSCOPIC CHOLECYSTECTOMY  Patient location during evaluation: Phase II Anesthesia Type: General Level of consciousness: awake Pain management: pain level controlled Vital Signs Assessment: post-procedure vital signs reviewed and stable Respiratory status: spontaneous breathing and respiratory function stable Cardiovascular status: blood pressure returned to baseline and stable Postop Assessment: no headache and no apparent nausea or vomiting Anesthetic complications: no Comments: Late entry   No notable events documented.   Last Vitals:  Vitals:   02/04/23 1000 02/04/23 1008  BP: 120/65 120/65  Pulse: (!) 51 (!) 56  Resp: 16 16  Temp:  (!) 36.4 C  SpO2: 95% 97%    Last Pain:  Vitals:   02/04/23 1008  TempSrc: Oral  PainSc: 0-No pain                 Windell Norfolk

## 2023-02-04 NOTE — Op Note (Signed)
Patient:  [name] [name]  DOB:  1960-10-21  MRN:  [id number]   Preop Diagnosis: Chronic cholecystitis  Postop Diagnosis: Same  Procedure: Robotic assisted laparoscopic cholecystectomy  Surgeon: Franky Macho, MD  Anes: General Endotracheal  Indications: Patient is a 62 year old white female who presents with biliary colic secondary to chronic cholecystitis.  The risks and benefits of the procedure including bleeding, infection, hepatobiliary injury, and the possibility of an open procedure were fully explained to the patient, who gave informed consent.  Procedure note: The patient was placed in the supine position.  After induction of general endotracheal anesthesia, the abdomen was prepped and draped using the usual sterile technique with ChloraPrep.  Surgical site confirmation was performed.  An infraumbilical incision was made down to the fascia.  A Veress needle was introduced into the abdominal cavity and confirmation of placement was done using the saline drop test.  The abdomen was then insufflated to 15 mmHg pressure.  An 8 mm trocar was introduced into the abdominal cavity under direct visualization without difficulty.  Additional 8 mm trocars were placed of the left upper quadrant, right lower quadrant, and right flank regions.  The robot was then targeted and docked.  The patient was placed in reverse Trendelenburg position.  The liver appeared to be within normal limits.  The gallbladder was retracted in a dynamic fashion in order to provide a critical view of the triangle of Calot.  The cystic duct was first identified.  Its juncture to the infundibulum was fully identified.  This was confirmed by [facility]  Hem-o-lok clips were placed proximally distally on the cystic duct and the cystic duct was divided.  This was likewise done of the cystic artery.  The gallbladder was freed away from the gallbladder fossa using Bovie electrocautery.  The gallbladder was delivered through the left  upper quadrant trocar site without difficulty.  The gallbladder fossa was inspected and no abnormal bleeding or bile leakage was noted.  The robot was undocked and all air was evacuated from the abdominal cavity prior to removal of the trocars.  All wounds were irrigated with normal saline.  All wounds were injected with 0.5% Sensorcaine.  All incisions were closed using a 4-0 Monocryl subcuticular suture.  Dermabond was applied.  All tape and needle counts were correct at the end of the procedure.  The patient was extubated in the operating room and transferred to PACU in stable condition.  Complications: None  EBL: Minimal  Specimen: Gallbladder

## 2023-02-05 LAB — SURGICAL PATHOLOGY

## 2023-02-11 ENCOUNTER — Ambulatory Visit: Payer: Medicare Other | Admitting: Internal Medicine

## 2023-02-11 ENCOUNTER — Encounter: Payer: Self-pay | Admitting: Internal Medicine

## 2023-02-11 VITALS — BP 124/70 | HR 76 | Temp 98.3°F | Resp 18 | Wt 226.2 lb

## 2023-02-11 DIAGNOSIS — L501 Idiopathic urticaria: Secondary | ICD-10-CM | POA: Diagnosis not present

## 2023-02-11 MED ORDER — FAMOTIDINE 40 MG PO TABS: 40.0000 mg | ORAL_TABLET | Freq: Every day | ORAL | 5 refills | Status: DC | PRN

## 2023-02-11 MED ORDER — CETIRIZINE HCL 10 MG PO TABS: 20.0000 mg | ORAL_TABLET | Freq: Two times a day (BID) | ORAL | 5 refills | Status: DC | PRN

## 2023-02-11 MED ORDER — HYDROXYZINE HCL 10 MG PO TABS: 10.0000 mg | ORAL_TABLET | Freq: Every evening | ORAL | 1 refills | Status: DC | PRN

## 2023-02-11 NOTE — Progress Notes (Signed)
FOLLOW UP Date of Service/Encounter:  02/11/23   Subjective:  [name] [name] (DOB: 15-Jun-1960) is a 62 y.o. female who [facility] for follow up for idiopathic urticaria.   History obtained from: chart review and patient. Last visit 11/05/2022 and at the time, hives were doing well.  On Zyrtec BID, Pepcid BID, Hydroxyzine Prn.  Discussed considering Xolair.   Breaking out about once a week, mostly short lived.  Does not bother her much.  Taking Zyrtec 10mg  daily and Pepcid 40mg  daily.  She also recently underwent cholecystectomy and is already feeling a lot better and less stressed. The GI doctors have her on Pepcid also.  Has not needed hydroxyzine.   Past Medical History: Past Medical History:  Diagnosis Date   Anxiety 2008   seen in ED /w chest pain but told that she was having anxiety   Arthritis    Colon polyps    History of kidney stones 1999   History of migraine    History of stress test 2010   done due to chest pain /w Dr. Jacinto Halim. Pt. told that it was wnl.   Hives of unknown origin    told by PCP Terri Piedra) to use Zrytec BID to control hives    Hypertension    Kidney stones    Neuromuscular disorder (HCC)    back pain   Obesity, unspecified    Sleep difficulties    has had a sleep study - t old that she didn't meet criteria for machine    Syncope    due to pain    Objective:  BP 124/70   Pulse 76   Temp 98.3 F (36.8 C)   Resp 18   Wt 226 lb 4 oz (102.6 kg)   SpO2 95%   BMI 44.19 kg/m  Body mass index is 44.19 kg/m. Physical Exam: GEN: alert, well developed HEENT: clear conjunctiva, MMM HEART: regular rate and rhythm, no murmur LUNGS: clear to auscultation bilaterally, no coughing, unlabored respiration SKIN: no rashes or lesions   Assessment:   1. Idiopathic urticaria     Plan/Recommendations:  Idiopathic Urticaria (Hives): - Well controlled.  - At this time etiology of hives and swelling is unknown.  Hives can be caused by a variety of different triggers including stress, illness/infection, exercise, pressure, vibrations, extremes of temperature to name a few however majority of the time there is no identifiable trigger.  - 09/2022: normal tryptase, TSH, CU index, alpha gal  - Continue Zyrtec 10mg  daily and Pepcid 40mg  daily.  She is on Pepcid for GI issues also.   - If hives worsen, increase to Zyrtec 10mg  twice daily and Pepcid 40mg  twice daily.   - Use hydroxyzine 10mg  nightly as needed for itching/hives.    Return in about 6 months (around 08/12/2023).  Alesia Morin, MD [facility]

## 2023-02-11 NOTE — Patient Instructions (Addendum)
Idiopathic Urticaria (Hives): - At this time etiology of hives and swelling is unknown. Hives can be caused by a variety of different triggers including stress, illness/infection, exercise, pressure, vibrations, extremes of temperature to name a few however majority of the time there is no identifiable trigger.  - 09/2022: normal tryptase, TSH, CU index, alpha gal  - Continue Zyrtec 10mg  daily and Pepcid 40mg  daily.  She is on Pepcid for GI issues also.   - If hives worsen, increase to Zyrtec 10mg  twice daily and Pepcid 40mg  twice daily.   - Use hydroxyzine 10mg  nightly as needed for itching/hives.

## 2023-02-12 ENCOUNTER — Encounter: Payer: Self-pay | Admitting: General Surgery

## 2023-02-15 ENCOUNTER — Ambulatory Visit (INDEPENDENT_AMBULATORY_CARE_PROVIDER_SITE_OTHER): Payer: Medicare Other | Admitting: General Surgery

## 2023-02-15 ENCOUNTER — Encounter: Payer: Self-pay | Admitting: General Surgery

## 2023-02-15 DIAGNOSIS — Z09 Encounter for follow-up examination after completed treatment for conditions other than malignant neoplasm: Secondary | ICD-10-CM

## 2023-02-15 DIAGNOSIS — K811 Chronic cholecystitis: Secondary | ICD-10-CM

## 2023-02-15 NOTE — Progress Notes (Signed)
Subjective:     [name] [name]  Telephone virtual postoperative visit performed with patient.  She states she is doing very well.  Her preoperative symptoms have resolved.  She is pleased with the results.  She denies any fever or chills.  Her incisions are healing well. Objective:    There were no vitals taken for this visit.  General:  alert, cooperative, and no distress  Final pathology reviewed with patient.     Assessment:    Doing well postoperatively.    Plan:   Patient may resume normal activity.  She is to follow-up with me as needed.  As this was a part of the total global surgical fee, this was not a billable visit.  Total telephone time was 2-1/2 minutes.

## 2023-02-20 ENCOUNTER — Telehealth (INDEPENDENT_AMBULATORY_CARE_PROVIDER_SITE_OTHER): Payer: Self-pay | Admitting: Gastroenterology

## 2023-02-20 NOTE — Telephone Encounter (Signed)
Pt contacted to get TCS scheduled (pt seen [facility] 11/26/22). Pt had gallbladder taken out 02/04/23 and doesn't want to schedule TCS at this time.

## 2023-02-26 ENCOUNTER — Ambulatory Visit: Payer: Medicare Other | Admitting: Gastroenterology

## 2023-03-10 ENCOUNTER — Other Ambulatory Visit: Payer: Self-pay | Admitting: Family Medicine

## 2023-03-13 ENCOUNTER — Ambulatory Visit: Payer: Medicare Other

## 2023-03-13 VITALS — Ht 60.0 in | Wt 231.0 lb

## 2023-03-13 DIAGNOSIS — Z0000 Encounter for general adult medical examination without abnormal findings: Secondary | ICD-10-CM

## 2023-03-13 NOTE — Progress Notes (Signed)
Subjective:   [name] [name] is a 62 y.o. female who presents for an Initial Medicare Annual Wellness Visit.  Visit Complete: Virtual I connected with  [name] [name] on 03/13/23 by a audio enabled telemedicine application and verified that I am speaking with the correct person using two identifiers.  Patient Location: Home  Provider Location: Home Office  I discussed the limitations of evaluation and management by telemedicine. The patient expressed understanding and agreed to proceed.  Vital Signs: Because this visit was a virtual/telehealth visit, some criteria may be missing or patient reported. Any vitals not documented were not able to be obtained and vitals that have been documented are patient reported.  Patient Medicare AWV questionnaire was completed by the patient on 03/13/2023; I have confirmed that all information answered by patient is correct and no changes since this date.  Cardiac Risk Factors include: advanced age (>47men, >11 women);dyslipidemia;hypertension     Objective:    Today's Vitals   03/13/23 1036  Weight: 231 lb (104.8 kg)  Height: 5' (1.524 m)   Body mass index is 45.11 kg/m.     03/13/2023   10:43 AM 01/31/2023    9:04 AM 11/24/2020    8:38 AM 09/17/2019    2:32 PM 09/17/2019    6:21 AM 09/10/2019    8:15 AM 12/24/2018    1:10 PM  Advanced Directives  Does Patient Have a Medical Advance Directive? Yes Yes Yes Yes Yes Yes Yes  Type of Estate agent of [place];Living will Healthcare Power of [place];Living will Healthcare Power of [place];Living will Healthcare Power of [place];Living will Healthcare Power of [place];Living will Living will;Healthcare Power of [employer] Power of [place];Living will  Does patient want to make changes to medical advance directive?    No - Patient declined   No - Patient declined  Copy of Healthcare Power of Attorney in Chart? No - copy requested No - copy requested Yes -  validated most recent copy scanned in chart (See row information) No - copy requested   No - copy requested  Would patient like information on creating a medical advance directive?   No - Patient declined        Current Medications (verified) Outpatient Encounter Medications as of 03/13/2023  Medication Sig   atorvastatin (LIPITOR) 20 MG tablet Take 1 tablet (20 mg total) by mouth daily.   cetirizine (ZYRTEC) 10 MG tablet Take 2 tablets (20 mg total) by mouth 2 (two) times daily as needed for allergies (hives).   diltiazem (CARDIZEM CD) 120 MG 24 hr capsule Take 1 capsule (120 mg total) by mouth daily.   famotidine (PEPCID) 40 MG tablet Take 1 tablet (40 mg total) by mouth daily as needed (hives).   hydrochlorothiazide (MICROZIDE) 12.5 MG capsule TAKE 1 CAPSULE BY MOUTH EVERY DAY   hydrOXYzine (ATARAX) 10 MG tablet Take 1 tablet (10 mg total) by mouth at bedtime as needed for itching.   predniSONE (STERAPRED UNI-PAK 21 TAB) 10 MG (21) TBPK tablet Take by mouth.   triamcinolone ointment (KENALOG) 0.5 % Apply 1 Application topically 2 (two) times daily. (Patient taking differently: Apply 1 Application topically 2 (two) times daily as needed (itching).)   iron polysaccharides (NU-IRON) 150 MG capsule Take 1 capsule (150 mg total) by mouth daily.   oxyCODONE (ROXICODONE) 5 MG immediate release tablet Take 1 tablet (5 mg total) by mouth every 6 (six) hours as needed for severe pain.   Facility-Administered Encounter Medications as of  03/13/2023  Medication   gadopentetate dimeglumine (MAGNEVIST) injection 20 mL    Allergies (verified) Gabapentin, Other, Penicillin g, and Penicillins   History: Past Medical History:  Diagnosis Date   Anxiety 2008   seen in ED /w chest pain but told that she was having anxiety   Arthritis    Colon polyps    History of kidney stones 1999   History of migraine    History of stress test 2010   done due to chest pain /w Dr. Jacinto Halim. Pt. told that it was wnl.    Hives of unknown origin    told by PCP Terri Piedra) to use Zrytec BID to control hives    Hypertension    Kidney stones    Neuromuscular disorder (HCC)    back pain   Obesity, unspecified    Sleep difficulties    has had a sleep study - t old that she didn't meet criteria for machine    Syncope    due to pain   Past Surgical History:  Procedure Laterality Date   COLONOSCOPY  2016   DECOMPRESSIVE LUMBAR LAMINECTOMY LEVEL 1 N/A 10/28/2013   Procedure: DECOMPRESSION L4-5;  Surgeon: Venita Lick, MD;  Location: [facility] OR;  Service: Orthopedics;  Laterality: N/A;   KNEE ARTHROPLASTY Right 12/24/2018   Procedure: COMPUTER ASSISTED TOTAL KNEE ARTHROPLASTY;  Surgeon: Samson Frederic, MD;  Location: [facility];  Service: Orthopedics;  Laterality: Right;   KNEE ARTHROPLASTY Left 09/17/2019   Procedure: COMPUTER ASSISTED TOTAL KNEE ARTHROPLASTY;  Surgeon: Samson Frederic, MD;  Location: [facility];  Service: Orthopedics;  Laterality: Left;   KNEE ARTHROSCOPY  12/2008   left knee   KNEE ARTHROSCOPY Right 01/27/2018   PARTIAL HYSTERECTOMY  10/1994   thumb surgery Right 03/1975   Family History  Problem Relation Age of Onset   Stroke Father    Diabetes Other    Hyperlipidemia Other    Heart disease Other    Hypertension Other    Cancer Other    Breast cancer Neg Hx    Colon cancer Neg Hx    Esophageal cancer Neg Hx    Stomach cancer Neg Hx    Rectal cancer Neg Hx    Allergic rhinitis Neg Hx    Angioedema Neg Hx    Asthma Neg Hx    Eczema Neg Hx    Urticaria Neg Hx    Social History   Socioeconomic History   Marital status: Single    Spouse name: Not on file   Number of children: 0   Years of education: Not on file   Highest education level: Bachelor's degree (e.g., BA, AB, BS)  Occupational History   Occupation: Radiographer, therapeutic  Tobacco Use   Smoking status: Never   Smokeless tobacco: Never  Vaping Use   Vaping status: Never Used  Substance and Sexual Activity   Alcohol use:  Yes    Comment: rare   Drug use: No   Sexual activity: Not on file  Other Topics Concern   Not on file  Social History Narrative   Not on file   Social Determinants of Health   Financial Resource Strain: Low Risk  (03/13/2023)   Overall Financial Resource Strain (CARDIA)    Difficulty of Paying Living Expenses: Not hard at all  Food Insecurity: No Food Insecurity (03/13/2023)   Hunger Vital Sign    Worried About Running Out of Food in the Last Year: Never true    Ran Out of Food  in the Last Year: Never true  Transportation Needs: No Transportation Needs (03/13/2023)   PRAPARE - Administrator, Civil Service (Medical): No    Lack of Transportation (Non-Medical): No  Physical Activity: Inactive (03/13/2023)   Exercise Vital Sign    Days of Exercise per Week: 0 days    Minutes of Exercise per Session: 0 min  Stress: No Stress Concern Present (03/13/2023)   [employer] of Occupational Health - Occupational Stress Questionnaire    Feeling of Stress : Not at all  Social Connections: Socially Isolated (03/13/2023)   Social Connection and Isolation Panel [NHANES]    Frequency of Communication with Friends and Family: More than three times a week    Frequency of Social Gatherings with Friends and Family: More than three times a week    Attends Religious Services: Never    Database administrator or Organizations: No    Attends Engineer, structural: Never    Marital Status: Never married    Tobacco Counseling Counseling given: Not Answered   Clinical Intake:  Pre-visit preparation completed: Yes  Pain : No/denies pain     Nutritional Risks: None Diabetes: No  How often do you need to have someone help you when you read instructions, pamphlets, or other written materials from your doctor or pharmacy?: 1 - Never  Interpreter Needed?: No  Information entered by :: Renie Ora, LPN   Activities of Daily Living    03/13/2023   10:43 AM  03/11/2023   11:15 AM  In your present state of health, do you have any difficulty performing the following activities:  Hearing? 0 0  Vision? 0 0  Difficulty concentrating or making decisions? 0 0  Walking or climbing stairs? 0 1  Dressing or bathing? 0 0  Doing errands, shopping? 0 0  Preparing Food and eating ? N N  Using the Toilet? N N  In the past six months, have you accidently leaked urine? N N  Do you have problems with loss of bowel control? N N  Managing your Medications? N N  Managing your Finances? N N  Housekeeping or managing your Housekeeping? N Y    Patient Care Team: Del Newman Nip, Tenna Child, FNP as PCP - General (Family Medicine)  Indicate any recent Medical Services you may have received from other than [facility] providers in the past year (date may be approximate).     Assessment:   This is a routine wellness examination for [place].  Hearing/Vision screen Vision Screening - Comments:: Wears rx glasses - up to date with routine eye exams with  [facility]    Goals Addressed             This Visit's Progress    DIET - EAT MORE FRUITS AND VEGETABLES         Depression Screen    03/13/2023   10:41 AM 01/21/2023    9:40 AM 12/13/2022   10:50 AM 09/17/2022    9:47 AM 08/01/2022   10:00 AM 11/24/2020    8:36 AM 05/20/2018    7:59 AM  PHQ 2/9 Scores  PHQ - 2 Score 0 0 0 1 0 4 2  PHQ- 9 Score 0 1 0 5 6 17 2     Fall Risk    03/13/2023   10:37 AM 03/11/2023   11:15 AM 01/21/2023    9:40 AM 01/01/2023   12:56 PM 12/13/2022   10:50 AM  Fall Risk   Falls  in the past year? 0 0 0 0 0  Number falls in past yr: 0 0   0  Injury with Fall? 0 0 0  0  Risk for fall due to : No Fall Risks   No Fall Risks No Fall Risks  Follow up Falls prevention discussed   Falls evaluation completed Falls evaluation completed    MEDICARE RISK AT HOME: Medicare Risk at Home Any stairs in or around the home?: Yes If so, are there any without handrails?: No Home free of loose  throw rugs in walkways, pet beds, electrical cords, etc?: Yes Adequate lighting in your home to reduce risk of falls?: Yes Life alert?: No Use of a cane, walker or w/c?: No Grab bars in the bathroom?: Yes Shower chair or bench in shower?: Yes Elevated toilet seat or a handicapped toilet?: No  TIMED UP AND GO:  Was the test performed? No    Cognitive Function:        03/13/2023   10:44 AM  6CIT Screen  What Year? 0 points  What month? 0 points  What time? 0 points  Count back from 20 0 points  Months in reverse 0 points  Repeat phrase 0 points  Total Score 0 points    Immunizations Immunization History  Administered Date(s) Administered   Influenza Split 04/30/2009, 01/28/2013   Influenza, Seasonal, Injecte, Preservative Fre 01/22/2023   Influenza,inj,Quad PF,6+ Mos 04/11/2017, 01/23/2018, 02/09/2019, 01/26/2021, 02/14/2022   Influenza,inj,quad, With Preservative 04/11/2017   Influenza-Unspecified 04/30/2009, 01/29/2015, 02/09/2019, 02/02/2020, 02/14/2022   PFIZER Comirnaty(Gray Top)Covid-19 Tri-Sucrose Vaccine 10/10/2020   PFIZER(Purple Top)SARS-COV-2 Vaccination 08/07/2019, 08/31/2019, 03/22/2020   Pfizer Covid-19 Vaccine Bivalent Booster 72yrs & up 01/26/2021   Pfizer(Comirnaty)Fall Seasonal Vaccine 12 years and older 02/14/2022   Pneumococcal Polysaccharide-23 01/28/2013   Tdap 04/30/2005, 07/27/2015   Zoster Recombinant(Shingrix) 10/07/2019, 12/24/2019    TDAP status: Up to date  Flu Vaccine status: Up to date  Pneumococcal vaccine status: Up to date  Covid-19 vaccine status: Completed vaccines  Qualifies for Shingles Vaccine? Yes   Zostavax completed Yes   Shingrix Completed?: Yes  Screening Tests Health Maintenance  Topic Date Due   Cervical Cancer Screening (HPV/Pap Cotest)  Never done   MAMMOGRAM  01/21/2024   Medicare Annual Wellness (AWV)  03/12/2024   Colonoscopy  12/27/2024   DTaP/Tdap/Td (3 - Td or Tdap) 07/26/2025   INFLUENZA VACCINE   Completed   COVID-19 Vaccine  Completed   Hepatitis C Screening  Completed   HIV Screening  Completed   Zoster Vaccines- Shingrix  Completed   HPV VACCINES  Aged Out    Health Maintenance  Health Maintenance Due  Topic Date Due   Cervical Cancer Screening (HPV/Pap Cotest)  Never done    Colorectal cancer screening: Type of screening: Colonoscopy. Completed 12/28/2019. Repeat every 5 years  Mammogram status: Completed 01/21/2023. Repeat every year  Bone Density status: Ordered not of age . Pt provided with contact info and advised to call to schedule appt.  Lung Cancer Screening: (Low Dose CT Chest recommended if Age 18-80 years, 20 pack-year currently smoking OR have quit w/in 15years.) does not qualify.   Lung Cancer Screening Referral: n/a  Additional Screening:  Hepatitis C Screening: does not qualify; Completed 04/11/2017  Vision Screening: Recommended annual ophthalmology exams for early detection of glaucoma and other disorders of the eye. Is the patient up to date with their annual eye exam?  Yes  Who is the provider or what is the name of  the office in which the patient attends annual eye exams? [facility]  If pt is not established with a provider, would they like to be referred to a provider to establish care? No .   Dental Screening: Recommended annual dental exams for proper oral hygiene   Community Resource Referral / Chronic Care Management: CRR required this visit?  No   CCM required this visit?  No     Plan:     I have personally reviewed and noted the following in the patient's chart:   Medical and social history Use of alcohol, tobacco or illicit drugs  Current medications and supplements including opioid prescriptions. Patient is not currently taking opioid prescriptions. Functional ability and status Nutritional status Physical activity Advanced directives List of other physicians Hospitalizations, surgeries, and ER visits in previous 12  months Vitals Screenings to include cognitive, depression, and falls Referrals and appointments  In addition, I have reviewed and discussed with patient certain preventive protocols, quality metrics, and best practice recommendations. A written personalized care plan for preventive services as well as general preventive health recommendations were provided to patient.     Lorrene Reid, LPN   [id number]   After Visit Summary: (MyChart) Due to this being a telephonic visit, the after visit summary with patients personalized plan was offered to patient via MyChart   Nurse Notes: none

## 2023-03-13 NOTE — Patient Instructions (Signed)
Ms. Skillin , Thank you for taking time to come for your Medicare Wellness Visit. I appreciate your ongoing commitment to your health goals. Please review the following plan we discussed and let me know if I can assist you in the future.   Referrals/Orders/Follow-Ups/Clinician Recommendations: Aim for 30 minutes of exercise or brisk walking, 6-8 glasses of water, and 5 servings of fruits and vegetables each day.   This is a list of the screening recommended for you and due dates:  Health Maintenance  Topic Date Due   Pap with HPV screening  Never done   Mammogram  01/21/2024   Medicare Annual Wellness Visit  03/12/2024   Colon Cancer Screening  12/27/2024   DTaP/Tdap/Td vaccine (3 - Td or Tdap) 07/26/2025   Flu Shot  Completed   COVID-19 Vaccine  Completed   Hepatitis C Screening  Completed   HIV Screening  Completed   Zoster (Shingles) Vaccine  Completed   HPV Vaccine  Aged Out    Advanced directives: (Copy Requested) Please bring a copy of your health care power of attorney and living will to the office to be added to your chart at your convenience.  Next Medicare Annual Wellness Visit scheduled for next year: Yes insert Preventive Care Attachment Reference

## 2023-04-16 DIAGNOSIS — M5412 Radiculopathy, cervical region: Secondary | ICD-10-CM | POA: Insufficient documentation

## 2023-04-16 DIAGNOSIS — M542 Cervicalgia: Secondary | ICD-10-CM | POA: Insufficient documentation

## 2023-05-02 ENCOUNTER — Encounter: Payer: Self-pay | Admitting: Family Medicine

## 2023-05-06 NOTE — Telephone Encounter (Signed)
 Yes labs work will done the day of the visit

## 2023-05-22 NOTE — Patient Instructions (Signed)

## 2023-05-22 NOTE — Progress Notes (Unsigned)
   Established Patient Office Visit   Subjective  Patient ID: [name] [name], female    DOB: [birth year]  Age: 63 y.o. MRN: [id number]  No chief complaint on file.   She  has a past medical history of Anxiety (2008), Arthritis, Colon polyps, History of kidney stones (1999), History of migraine, History of stress test (2010), Hives of unknown origin, Hypertension, Kidney stones, Neuromuscular disorder (HCC), Obesity, unspecified, Sleep difficulties, and Syncope.  HPI  ROS    Objective:     There were no vitals taken for this visit. {Vitals History (Optional):23777}  Physical Exam   No results found for any visits on 05/23/23.  The ASCVD Risk score (Arnett DK, et al., 2019) failed to calculate for the following reasons:   The valid total cholesterol range is 130 to 320 mg/dL    Assessment & Plan:  There are no diagnoses linked to this encounter.  No follow-ups on file.   Cruzita Lederer Newman Nip, FNP

## 2023-05-23 ENCOUNTER — Encounter: Payer: Self-pay | Admitting: Family Medicine

## 2023-05-23 ENCOUNTER — Ambulatory Visit: Payer: Medicare Other | Admitting: Family Medicine

## 2023-05-23 VITALS — BP 131/84 | HR 73 | Ht 60.0 in | Wt 231.0 lb

## 2023-05-23 DIAGNOSIS — D509 Iron deficiency anemia, unspecified: Secondary | ICD-10-CM

## 2023-05-23 DIAGNOSIS — I10 Essential (primary) hypertension: Secondary | ICD-10-CM

## 2023-05-23 NOTE — Assessment & Plan Note (Signed)
Vitals:   05/23/23 0944 05/23/23 0951  BP: (!) 146/81 131/84  Continue Hydrochlorothiazide 12.5 mg once daily  Labs ordered. Discussed with  patient to monitor their blood pressure regularly and maintain a heart-healthy diet rich in fruits, vegetables, whole grains, and low-fat dairy, while reducing sodium intake to less than 2,300 mg per day. Regular physical activity, such as 30 minutes of moderate exercise most days of the week, will help lower blood pressure and improve overall cardiovascular health. Avoiding smoking, limiting alcohol consumption, and managing stress. Take  prescribed medication, & take it as directed and avoid skipping doses. Seek emergency care if your blood pressure is (over 180/100) or you experience chest pain, shortness of breath, or sudden vision changes.Patient verbalizes understanding regarding plan of care and all questions answered.

## 2023-05-23 NOTE — Assessment & Plan Note (Signed)
Iron/TIBC/Ferritin/ %Sat    Component Value Date/Time   IRON 19 (L) 01/22/2023 0929   TIBC 381 01/22/2023 0929   FERRITIN 16 01/22/2023 0929   IRONPCTSAT 5 (LL) 01/22/2023 0929  Repeat panel done today Continue iron polysaccharide iron 150 mg once daily   Advise to consume iron rich foods such as Red meat, pork, poultry, seafood, beans, dark green leafy vegetables, such as spinach, dried fruit, such as raisins and apricots. Iron-fortified cereals, and peas. I encourage to take over the counter iron tablets 65 mg once daily.

## 2023-05-24 ENCOUNTER — Encounter: Payer: Self-pay | Admitting: Family Medicine

## 2023-05-24 LAB — CBC WITH DIFFERENTIAL/PLATELET
Basophils Absolute: 0.1 10*3/uL (ref 0.0–0.2)
Basos: 1 %
EOS (ABSOLUTE): 0.3 10*3/uL (ref 0.0–0.4)
Eos: 4 %
Hematocrit: 43.4 % (ref 34.0–46.6)
Hemoglobin: 14.4 g/dL (ref 11.1–15.9)
Immature Grans (Abs): 0 10*3/uL (ref 0.0–0.1)
Immature Granulocytes: 0 %
Lymphocytes Absolute: 2 10*3/uL (ref 0.7–3.1)
Lymphs: 29 %
MCH: 29.6 pg (ref 26.6–33.0)
MCHC: 33.2 g/dL (ref 31.5–35.7)
MCV: 89 fL (ref 79–97)
Monocytes Absolute: 0.5 10*3/uL (ref 0.1–0.9)
Monocytes: 8 %
Neutrophils Absolute: 4 10*3/uL (ref 1.4–7.0)
Neutrophils: 58 %
Platelets: 320 10*3/uL (ref 150–450)
RBC: 4.87 x10E6/uL (ref 3.77–5.28)
RDW: 14.3 % (ref 11.7–15.4)
WBC: 6.9 10*3/uL (ref 3.4–10.8)

## 2023-05-24 LAB — BMP8+EGFR
BUN/Creatinine Ratio: 14 (ref 12–28)
BUN: 11 mg/dL (ref 8–27)
CO2: 23 mmol/L (ref 20–29)
Calcium: 9.2 mg/dL (ref 8.7–10.3)
Chloride: 102 mmol/L (ref 96–106)
Creatinine, Ser: 0.78 mg/dL (ref 0.57–1.00)
Glucose: 80 mg/dL (ref 70–99)
Potassium: 4.3 mmol/L (ref 3.5–5.2)
Sodium: 141 mmol/L (ref 134–144)
eGFR: 85 mL/min/{1.73_m2} (ref >59)

## 2023-05-24 LAB — IRON,TIBC AND FERRITIN PANEL
Ferritin: 73 ng/mL (ref 15–150)
Iron Saturation: 11 % — ABNORMAL LOW (ref 15–55)
Iron: 38 ug/dL (ref 27–139)
Total Iron Binding Capacity: 357 ug/dL (ref 250–450)
UIBC: 319 ug/dL (ref 118–369)

## 2023-06-01 ENCOUNTER — Other Ambulatory Visit: Payer: Self-pay | Admitting: Internal Medicine

## 2023-06-03 ENCOUNTER — Other Ambulatory Visit: Payer: Self-pay | Admitting: Family Medicine

## 2023-06-03 DIAGNOSIS — R001 Bradycardia, unspecified: Secondary | ICD-10-CM

## 2023-06-07 ENCOUNTER — Other Ambulatory Visit: Payer: Self-pay | Admitting: Family Medicine

## 2023-06-07 DIAGNOSIS — R001 Bradycardia, unspecified: Secondary | ICD-10-CM

## 2023-07-19 ENCOUNTER — Ambulatory Visit: Admitting: Family Medicine

## 2023-07-19 ENCOUNTER — Encounter: Payer: Self-pay | Admitting: Family Medicine

## 2023-07-19 ENCOUNTER — Ambulatory Visit: Payer: Self-pay

## 2023-07-19 VITALS — BP 154/81 | HR 94 | Temp 97.4°F | Resp 18 | Ht 60.0 in | Wt 236.0 lb

## 2023-07-19 DIAGNOSIS — J0100 Acute maxillary sinusitis, unspecified: Secondary | ICD-10-CM

## 2023-07-19 DIAGNOSIS — J329 Chronic sinusitis, unspecified: Secondary | ICD-10-CM | POA: Insufficient documentation

## 2023-07-19 DIAGNOSIS — M5030 Other cervical disc degeneration, unspecified cervical region: Secondary | ICD-10-CM | POA: Insufficient documentation

## 2023-07-19 MED ORDER — DOXYCYCLINE HYCLATE 100 MG PO CAPS
100.0000 mg | ORAL_CAPSULE | Freq: Two times a day (BID) | ORAL | 0 refills | Status: DC
Start: 2023-07-19 — End: 2023-08-12

## 2023-07-19 NOTE — Progress Notes (Signed)
   Established Patient Office Visit  Subjective   Patient ID: [name] [name], female    DOB: [birth year]  Age: 64 y.o. MRN: [id number]  Chief Complaint  Patient presents with   Headache   Nasal Congestion    Symptoms started 07/12/23. Tried Coricidin    Cough    Mucus clear to yellowish     Headache  Associated symptoms include coughing.  Cough Associated symptoms include headaches.   63 yo with HTN, iron deficiency anemia, idiopathic [place] area, borderline DMT2, Hx of kidney stones, migraine headaches, DDD cervical and lumbar spines, s/p cholecystectomy, s/p hysterectomy partial s/p bilateral TKR. Started 3/14 with nasal congestion, headache, sore throat.  Did not have a fever and has not had myalgias.  She is blowing her nose and getting yellow mucus she has a cough but denies wheezing or shortness of breath with ambulation.  He is not a smoker    Review of Systems  Respiratory:  Positive for cough.   Neurological:  Positive for headaches.      Objective:     BP (!) 154/81 (BP Location: Left Arm, Patient Position: Sitting, Cuff Size: Large)   Pulse 94   Temp (!) 97.4 F (36.3 C) (Oral)   Resp 18   Ht 5' (1.524 m)   Wt 236 lb (107 kg)   SpO2 96%   BMI 46.09 kg/m    Physical Exam Vitals and nursing note reviewed.  Constitutional:      Appearance: Normal appearance.  HENT:     Head: Normocephalic and atraumatic.     Nose:     Right Turbinates: Swollen. Not enlarged.     Left Turbinates: Swollen. Not enlarged.     Right Sinus: Maxillary sinus tenderness present.  Eyes:     Conjunctiva/sclera: Conjunctivae normal.  Cardiovascular:     Rate and Rhythm: Normal rate and regular rhythm.     Heart sounds: Murmur (2/6 SEM) heard.  Pulmonary:     Effort: Pulmonary effort is normal.     Breath sounds: Normal breath sounds.  Musculoskeletal:     Right lower leg: No edema.     Left lower leg: No edema.  Skin:    General: Skin is warm and dry.  Neurological:      Mental Status: She is alert and oriented to person, place, and time.  Psychiatric:        Mood and Affect: Mood normal.        Behavior: Behavior normal.        Thought Content: Thought content normal.        Judgment: Judgment normal.          No results found for any visits on 07/19/23.    The ASCVD Risk score (Arnett DK, et al., 2019) failed to calculate for the following reasons:   The valid total cholesterol range is 130 to 320 mg/dL    Assessment & Plan:  Acute non-recurrent maxillary sinusitis Assessment & Plan: Had a viral URI about 8 days ago and now has mucopurulent nasal discharge, sinusitis.  Allergic to PCN.  Doxycycline 100mg  BID for 10 days.    Orders: -     Doxycycline Hyclate; Take 1 capsule (100 mg total) by mouth 2 (two) times daily.  Dispense: 20 capsule; Refill: 0     No follow-ups on file.    Alease Medina, MD

## 2023-07-19 NOTE — Assessment & Plan Note (Signed)
 Had a viral URI about 8 days ago and now has mucopurulent nasal discharge, sinusitis.  Allergic to PCN.  Doxycycline 100mg  BID for 10 days.

## 2023-07-19 NOTE — Telephone Encounter (Signed)
  Chief Complaint: sinus pressure/congestion Symptoms: productive cough with clear to yellow mucus, nasal congestion/pressure Frequency: x 7 days Pertinent Negatives: Patient denies fever, body aches, hemoptysis, chest pain, SOB. Disposition: [] ED /[] Urgent Care (no appt availability in office) / [x] Appointment(In office/virtual)/ []  [facility]/ [] Home Care/ [] Refused Recommended Disposition /[] [facility]/ []  Follow-up with PCP Additional Notes: Patient states she and some friends went on a trip to [place] and returned sick on 07/13/23.  She has been taking Coricidin since 07/13/23. She states she called in today and would like to be seen due to the sinus congestion has worsened and her mucus has turned yellow. Patient agreeable to acute virtual visit with available provider today.  Copied from CRM [id number]. Topic: Appointments - Appointment Scheduling >> Jul 19, 2023  9:07 AM Louie Casa B wrote: Patient/patient representative is calling to schedule an appointment. Refer to attachments for appointment information.  Patient is calling with increased cough and mucous Reason for Disposition  Cough  Answer Assessment - Initial Assessment Questions 1. ONSET: "When did the cough begin?"      07/13/23.  2. SEVERITY: "How bad is the cough today?"      Mild, she states the cough is not very deep.  3. SPUTUM: "Describe the color of your sputum" (none, dry cough; clear, white, yellow, green)     She states it started clear and now is more yellow.  4. HEMOPTYSIS: "Are you coughing up any blood?" If so ask: "How much?" (flecks, streaks, tablespoons, etc.)     Denies. She states there was some blood in her left nostril when she blew her nose.  5. DIFFICULTY BREATHING: "Are you having difficulty breathing?" If Yes, ask: "How bad is it?" (e.g., mild, moderate, severe)    - MILD: No SOB at rest, mild SOB with walking, speaks normally in sentences, can lie down, no retractions, pulse <  100.    - MODERATE: SOB at rest, SOB with minimal exertion and prefers to sit, cannot lie down flat, speaks in phrases, mild retractions, audible wheezing, pulse 100-120.    - SEVERE: Very SOB at rest, speaks in single words, struggling to breathe, sitting hunched forward, retractions, pulse > 120      Denies.  6. FEVER: "Do you have a fever?" If Yes, ask: "What is your temperature, how was it measured, and when did it start?"     Denies.  7. CARDIAC HISTORY: "Do you have any history of heart disease?" (e.g., heart attack, congestive heart failure)      Denies.  8. LUNG HISTORY: "Do you have any history of lung disease?"  (e.g., pulmonary embolus, asthma, emphysema)     Denies.  9. PE RISK FACTORS: "Do you have a history of blood clots?" (or: recent major surgery, recent prolonged travel, bedridden)     Denies hx of blood clots but did travel on plane recent.  10. OTHER SYMPTOMS: "Do you have any other symptoms?" (e.g., runny nose, wheezing, chest pain)       Nasal congestion/pressure.  11. PREGNANCY: "Is there any chance you are pregnant?" "When was your last menstrual period?"       N/A.  12. TRAVEL: "Have you traveled out of the country in the last month?" (e.g., travel history, exposures)       Denies, patient states she travelled to [place] and was sick on return (she states 2 of the 4 people on the trip are sick).  Protocols used: Cough - Acute Productive-A-AH

## 2023-08-12 ENCOUNTER — Ambulatory Visit: Payer: Medicare Other | Admitting: Internal Medicine

## 2023-08-12 ENCOUNTER — Encounter: Payer: Self-pay | Admitting: Internal Medicine

## 2023-08-12 VITALS — BP 126/68 | HR 96 | Temp 98.1°F | Resp 16 | Ht 60.0 in | Wt 239.0 lb

## 2023-08-12 DIAGNOSIS — L501 Idiopathic urticaria: Secondary | ICD-10-CM

## 2023-08-12 MED ORDER — HYDROXYZINE HCL 10 MG PO TABS: 10.0000 mg | ORAL_TABLET | Freq: Every day | ORAL | 1 refills | Status: DC | PRN

## 2023-08-12 MED ORDER — FAMOTIDINE 40 MG PO TABS: 40.0000 mg | ORAL_TABLET | Freq: Two times a day (BID) | ORAL | 5 refills | Status: DC | PRN

## 2023-08-12 MED ORDER — CETIRIZINE HCL 10 MG PO TABS: 20.0000 mg | ORAL_TABLET | Freq: Two times a day (BID) | ORAL | 5 refills | Status: DC | PRN

## 2023-08-12 NOTE — Progress Notes (Signed)
   FOLLOW UP Date of Service/Encounter:  08/12/23   Subjective:  [name] [name] (DOB: 1961-01-01) is a 63 y.o. female who [facility] for follow up for idiopathic urticaria.   History obtained from: chart review and patient. Last visit was on 02/11/2023 and was well controlled on anti histamines.   Reports hives have done well but she is worried about summer weather.  Has had few flare ups, nothing major.  Lasts at most 2 days.  Taking Zyrtec 10mg  twice daily.  Off Pepcid after gallbladder surgery.   Has hydroxyzine but has not needed it.   Past Medical History: Past Medical History:  Diagnosis Date   Anxiety 2008   seen in ED /w chest pain but told that she was having anxiety   Arthritis    Colon polyps    History of kidney stones 1999   History of migraine    History of stress test 2010   done due to chest pain /w Dr. Berry [place]. Pt. told that it was wnl.   Hives of unknown origin    told by PCP Fleurette Humbles) to use Zrytec BID to control hives    Hypertension    Kidney stones    Neuromuscular disorder (HCC)    back pain   Obesity, unspecified    Sleep difficulties    has had a sleep study - t old that she didn't meet criteria for machine    Syncope    due to pain    Objective:  BP 126/68   Pulse 96   Temp 98.1 F (36.7 C)   Resp 16   Ht 5' (1.524 m)   Wt 239 lb (108.4 kg)   SpO2 95%   BMI 46.68 kg/m  Body mass index is 46.68 kg/m. Physical Exam: GEN: alert, well developed HEENT: clear conjunctiva, MMM HEART: regular rate and rhythm, no murmur LUNGS: clear to auscultation bilaterally, no coughing, unlabored respiration SKIN: no rashes or lesions   Assessment:   1. Idiopathic urticaria     Plan/Recommendations:   Idiopathic Urticaria (Hives): - At this time etiology of hives and swelling is unknown. Hives can be caused by a variety of different triggers including stress, illness/infection, exercise, pressure,  vibrations, extremes of temperature to name a few however majority of the time there is no identifiable trigger.  - 09/2022: normal tryptase, TSH, CU index, alpha gal  - Continue Zyrtec 10mg  twice daily.  - If hives worsen, increase to Zyrtec 20mg  twice daily and add Pepcid 40mg  twice daily.   - Use hydroxyzine 10mg  nightly as needed for itching/hives. - Consider Xolair, if hives worsen.     Return in about 6 months (around 02/11/2024).  Kristen Petri, MD [facility]

## 2023-08-12 NOTE — Patient Instructions (Addendum)
 Idiopathic Urticaria (Hives): - At this time etiology of hives and swelling is unknown. Hives can be caused by a variety of different triggers including stress, illness/infection, exercise, pressure, vibrations, extremes of temperature to name a few however majority of the time there is no identifiable trigger.  - 09/2022: normal tryptase, TSH, CU index, alpha gal  - Continue Zyrtec 10mg  twice daily.  - If hives worsen, increase to Zyrtec 20mg  twice daily and add Pepcid 40mg  twice daily.   - Use hydroxyzine 10mg  nightly as needed for itching/hives. - Consider Xolair, if hives worsen.

## 2023-09-01 ENCOUNTER — Other Ambulatory Visit: Payer: Self-pay | Admitting: Family Medicine

## 2023-09-20 ENCOUNTER — Ambulatory Visit: Payer: Medicare Other | Admitting: Family Medicine

## 2023-09-20 ENCOUNTER — Encounter: Payer: Self-pay | Admitting: Family Medicine

## 2023-09-20 VITALS — BP 138/82 | HR 66 | Resp 16 | Ht 60.0 in | Wt 241.1 lb

## 2023-09-20 DIAGNOSIS — I10 Essential (primary) hypertension: Secondary | ICD-10-CM

## 2023-09-20 DIAGNOSIS — R7303 Prediabetes: Secondary | ICD-10-CM | POA: Diagnosis not present

## 2023-09-20 DIAGNOSIS — D509 Iron deficiency anemia, unspecified: Secondary | ICD-10-CM

## 2023-09-20 DIAGNOSIS — E038 Other specified hypothyroidism: Secondary | ICD-10-CM | POA: Diagnosis not present

## 2023-09-20 NOTE — Progress Notes (Signed)
 Established Patient Office Visit   Subjective  Patient ID: Danique Hartsough, female    DOB: 18-Apr-1961  Age: 63 y.o. MRN: [id number]  Chief Complaint  Patient presents with   Hypertension    Follow up visit and wants labwork done also     She  has a past medical history of Anxiety (2008), Arthritis, Colon polyps, History of kidney stones (1999), History of migraine, History of stress test (2010), Hives of unknown origin, Hypertension, Kidney stones, Neuromuscular disorder (HCC), Obesity, unspecified, Sleep difficulties, and Syncope.  HPI Patient presents to the clinic for chronic follow up. For the details of today's visit, please refer to assessment and plan.   Review of Systems  Constitutional:  Negative for chills and fever.  Eyes:  Negative for blurred vision.  Respiratory:  Negative for shortness of breath.   Cardiovascular:  Negative for chest pain.  Genitourinary:  Negative for dysuria.  Musculoskeletal:  Positive for myalgias.  Neurological:  Negative for dizziness.      Objective:     BP 138/82   Pulse 66   Resp 16   Ht 5' (1.524 m)   Wt 241 lb 1.9 oz (109.4 kg)   SpO2 94%   BMI 47.09 kg/m  BP Readings from Last 3 Encounters:  09/20/23 138/82  08/12/23 126/68  07/19/23 (!) 154/81      Physical Exam Vitals reviewed.  Constitutional:      General: She is not in acute distress.    Appearance: Normal appearance. She is not ill-appearing, toxic-appearing or diaphoretic.  HENT:     Head: Normocephalic.  Eyes:     General:        Right eye: No discharge.        Left eye: No discharge.     Conjunctiva/sclera: Conjunctivae normal.  Cardiovascular:     Rate and Rhythm: Normal rate.     Pulses: Normal pulses.     Heart sounds: Normal heart sounds.  Pulmonary:     Effort: Pulmonary effort is normal. No respiratory distress.     Breath sounds: Normal breath sounds.  Abdominal:     General: Bowel sounds are normal.     Palpations: Abdomen is soft.      Tenderness: There is no abdominal tenderness. There is no right CVA tenderness, left CVA tenderness or guarding.  Skin:    General: Skin is warm and dry.     Capillary Refill: Capillary refill takes less than 2 seconds.  Neurological:     Mental Status: She is alert.  Psychiatric:        Mood and Affect: Mood normal.        Behavior: Behavior normal.      No results found for any visits on 09/20/23.  The ASCVD Risk score (Arnett DK, et al., 2019) failed to calculate for the following reasons:   The valid total cholesterol range is 130 to 320 mg/dL    Assessment & Plan:  Primary hypertension Assessment & Plan: Continue Hydrochlorothiazide  12.5 mg once daily  Labs ordered. Discussed with  patient to monitor their blood pressure regularly and maintain a heart-healthy diet rich in fruits, vegetables, whole grains, and low-fat dairy, while reducing sodium intake to less than 2,300 mg per day. Regular physical activity, such as 30 minutes of moderate exercise most days of the week, will help lower blood pressure and improve overall cardiovascular health. Avoiding smoking, limiting alcohol  consumption, and managing stress. Take  prescribed medication, & take it as  directed and avoid skipping doses. Seek emergency care if your blood pressure is (over 180/100) or you experience chest pain, shortness of breath, or sudden vision changes.Patient verbalizes understanding regarding plan of care and all questions answered.  Orders: -     BMP8+eGFR -     CBC with Differential/Platelet -     Lipid panel  Iron  deficiency anemia, unspecified iron  deficiency anemia type -     Iron , TIBC and Ferritin Panel  Prediabetes Assessment & Plan: Last Hemoglobin A1c: 5.8 Labs: Ordered today, results pending; will follow up accordingly. Reviewed non-pharmacological interventions, including a balanced diet rich in lean proteins, healthy fats, whole grains, and high-fiber vegetables. Emphasized reducing refined  sugars and processed carbohydrates, and incorporating more fruits, leafy greens, and legumes.   Orders: -     Hemoglobin A1c  TSH (thyroid-stimulating hormone deficiency) -     TSH + free T4    Return in about 6 months (around 03/22/2024), or if symptoms worsen or fail to improve, for Follow up.   Avelino Lek Amber Bail, FNP

## 2023-09-20 NOTE — Assessment & Plan Note (Signed)
 Continue Hydrochlorothiazide  12.5 mg once daily  Labs ordered. Discussed with  patient to monitor their blood pressure regularly and maintain a heart-healthy diet rich in fruits, vegetables, whole grains, and low-fat dairy, while reducing sodium intake to less than 2,300 mg per day. Regular physical activity, such as 30 minutes of moderate exercise most days of the week, will help lower blood pressure and improve overall cardiovascular health. Avoiding smoking, limiting alcohol  consumption, and managing stress. Take  prescribed medication, & take it as directed and avoid skipping doses. Seek emergency care if your blood pressure is (over 180/100) or you experience chest pain, shortness of breath, or sudden vision changes.Patient verbalizes understanding regarding plan of care and all questions answered.

## 2023-09-20 NOTE — Patient Instructions (Signed)

## 2023-09-20 NOTE — Assessment & Plan Note (Addendum)
 Last Hemoglobin A1c: 5.8 Labs: Ordered today, results pending; will follow up accordingly. Reviewed non-pharmacological interventions, including a balanced diet rich in lean proteins, healthy fats, whole grains, and high-fiber vegetables. Emphasized reducing refined sugars and processed carbohydrates, and incorporating more fruits, leafy greens, and legumes.

## 2023-09-21 LAB — IRON,TIBC AND FERRITIN PANEL
Ferritin: 84 ng/mL (ref 15–150)
Iron Saturation: 11 % — ABNORMAL LOW (ref 15–55)
Iron: 35 ug/dL (ref 27–139)
Total Iron Binding Capacity: 313 ug/dL (ref 250–450)
UIBC: 278 ug/dL (ref 118–369)

## 2023-09-21 LAB — CBC WITH DIFFERENTIAL/PLATELET
Basophils Absolute: 0.1 10*3/uL (ref 0.0–0.2)
Basos: 1 %
EOS (ABSOLUTE): 0.5 10*3/uL — ABNORMAL HIGH (ref 0.0–0.4)
Eos: 6 %
Hematocrit: 41.1 % (ref 34.0–46.6)
Hemoglobin: 13.6 g/dL (ref 11.1–15.9)
Immature Grans (Abs): 0 10*3/uL (ref 0.0–0.1)
Immature Granulocytes: 0 %
Lymphocytes Absolute: 2.2 10*3/uL (ref 0.7–3.1)
Lymphs: 29 %
MCH: 30.4 pg (ref 26.6–33.0)
MCHC: 33.1 g/dL (ref 31.5–35.7)
MCV: 92 fL (ref 79–97)
Monocytes Absolute: 0.6 10*3/uL (ref 0.1–0.9)
Monocytes: 8 %
Neutrophils Absolute: 4.1 10*3/uL (ref 1.4–7.0)
Neutrophils: 56 %
Platelets: 291 10*3/uL (ref 150–450)
RBC: 4.48 x10E6/uL (ref 3.77–5.28)
RDW: 12.7 % (ref 11.7–15.4)
WBC: 7.4 10*3/uL (ref 3.4–10.8)

## 2023-09-21 LAB — HEMOGLOBIN A1C
Est. average glucose Bld gHb Est-mCnc: 117 mg/dL
Hgb A1c MFr Bld: 5.7 % — ABNORMAL HIGH (ref 4.8–5.6)

## 2023-09-21 LAB — TSH+FREE T4
Free T4: 1.39 ng/dL (ref 0.82–1.77)
TSH: 1.81 u[IU]/mL (ref 0.450–4.500)

## 2023-09-21 LAB — LIPID PANEL
Chol/HDL Ratio: 2.6 ratio (ref 0.0–4.4)
Cholesterol, Total: 125 mg/dL (ref 100–199)
HDL: 49 mg/dL (ref >39)
LDL Chol Calc (NIH): 53 mg/dL (ref 0–99)
Triglycerides: 132 mg/dL (ref 0–149)
VLDL Cholesterol Cal: 23 mg/dL (ref 5–40)

## 2023-09-21 LAB — BMP8+EGFR
BUN/Creatinine Ratio: 13 (ref 12–28)
BUN: 10 mg/dL (ref 8–27)
CO2: 22 mmol/L (ref 20–29)
Calcium: 9.1 mg/dL (ref 8.7–10.3)
Chloride: 103 mmol/L (ref 96–106)
Creatinine, Ser: 0.75 mg/dL (ref 0.57–1.00)
Glucose: 105 mg/dL — ABNORMAL HIGH (ref 70–99)
Potassium: 3.9 mmol/L (ref 3.5–5.2)
Sodium: 140 mmol/L (ref 134–144)
eGFR: 89 mL/min/{1.73_m2} (ref >59)

## 2023-09-24 ENCOUNTER — Ambulatory Visit: Payer: Self-pay | Admitting: Family Medicine

## 2023-10-11 ENCOUNTER — Other Ambulatory Visit: Payer: Self-pay | Admitting: Family Medicine

## 2023-11-05 ENCOUNTER — Encounter: Payer: Self-pay | Admitting: Family Medicine

## 2023-11-06 ENCOUNTER — Telehealth: Payer: Self-pay

## 2023-11-06 ENCOUNTER — Telehealth: Payer: Self-pay | Admitting: Family Medicine

## 2023-11-06 NOTE — Telephone Encounter (Signed)
 I have not receive no form as of yet

## 2023-11-06 NOTE — Telephone Encounter (Signed)
Is this in your office?

## 2023-11-06 NOTE — Telephone Encounter (Signed)
 Medical clearance for hip surgery  Noted Copied Sleeved    Form placed provider box [place]) Copy form placed front desk folder

## 2023-11-06 NOTE — Telephone Encounter (Signed)
 Copied from CRM [id number]. Topic: Medical Record Request - Other >> Nov 06, 2023  8:35 AM Silvana PARAS wrote: Reason for CRM: Pt calling to f/u on right hip medical clearance form. Callback number is [phone].

## 2023-11-12 ENCOUNTER — Encounter: Payer: Self-pay | Admitting: Nurse Practitioner

## 2023-11-12 ENCOUNTER — Ambulatory Visit: Admitting: Nurse Practitioner

## 2023-11-12 ENCOUNTER — Telehealth: Payer: Self-pay

## 2023-11-12 VITALS — BP 166/88 | HR 105 | Ht 60.0 in | Wt 241.0 lb

## 2023-11-12 DIAGNOSIS — M25551 Pain in right hip: Secondary | ICD-10-CM | POA: Diagnosis not present

## 2023-11-12 DIAGNOSIS — G8929 Other chronic pain: Secondary | ICD-10-CM

## 2023-11-12 DIAGNOSIS — I10 Essential (primary) hypertension: Secondary | ICD-10-CM | POA: Diagnosis not present

## 2023-11-12 NOTE — Telephone Encounter (Signed)
 Faxed to [phone] with confirmation 11:33AM Form sent to scan

## 2023-11-12 NOTE — Telephone Encounter (Signed)
 Copied from CRM [id number]. Topic: General - Other >> Nov 12, 2023 11:50 AM Charlet HERO wrote: Reason for CRM: Patient is calling about leaving paper work in the restroom after visit today and wants to know if anyone turned it in. Patient would like to have a call back.

## 2023-11-12 NOTE — Telephone Encounter (Signed)
 Spoke with patient advised that her papers were turned in this morning says she will be by in the morning to pick them up.

## 2023-11-12 NOTE — Telephone Encounter (Signed)
 Surgical clearance complete, placed in [facility] office for faxing

## 2023-11-12 NOTE — Progress Notes (Addendum)
 Established Patient Office Visit  Subjective:  Patient ID: [name] [name], female    DOB: [birth year]  Age: 63 y.o. MRN: [id number]  Chief Complaint  Patient presents with   surgical clearance    Clearance for hip surgery     Patient here today for right hip replacement surgery, scheduled as soon as her preop form is completed which will be done today.  Ortho surgeon is Dr. Redell Shoals.  He also did both bilateral knee replacements, right knee was Aug 2020, left knee was Sep 20, 2019.  Patient feeling well, not smoking, no ETOH, no concerns about surgery.  Not on blood thinners.      No other concerns at this time.   Past Medical History:  Diagnosis Date   Anxiety 2008   seen in ED /w chest pain but told that she was having anxiety   Arthritis    Colon polyps    History of kidney stones 1999   History of migraine    History of stress test 2010   done due to chest pain /w Dr. Ladona. Pt. told that it was wnl.   Hives of unknown origin    told by PCP ETTER [place]ing) to use Zrytec BID to control hives    Hypertension    Kidney stones    Neuromuscular disorder (HCC)    back pain   Obesity, unspecified    Sleep difficulties    has had a sleep study - t old that she didn't meet criteria for machine    Syncope    due to pain    Past Surgical History:  Procedure Laterality Date   COLONOSCOPY  2016   DECOMPRESSIVE LUMBAR LAMINECTOMY LEVEL 1 N/A 10/28/2013   Procedure: DECOMPRESSION L4-5;  Surgeon: Donaciano Sprang, MD;  Location: [facility] OR;  Service: Orthopedics;  Laterality: N/A;   KNEE ARTHROPLASTY Right 12/24/2018   Procedure: COMPUTER ASSISTED TOTAL KNEE ARTHROPLASTY;  Surgeon: Shoals Redell, MD;  Location: [facility];  Service: Orthopedics;  Laterality: Right;   KNEE ARTHROPLASTY Left 09/17/2019   Procedure: COMPUTER ASSISTED TOTAL KNEE ARTHROPLASTY;  Surgeon: Shoals Redell, MD;  Location: [facility];  Service: Orthopedics;  Laterality: Left;   KNEE ARTHROSCOPY  12/2008   left knee    KNEE ARTHROSCOPY Right 01/27/2018   PARTIAL HYSTERECTOMY  10/1994   thumb surgery Right 03/1975    Social History   Socioeconomic History   Marital status: Single    Spouse name: Not on file   Number of children: 0   Years of education: Not on file   Highest education level: Bachelor's degree (e.g., BA, AB, BS)  Occupational History   Occupation: Radiographer, therapeutic  Tobacco Use   Smoking status: Never    Passive exposure: Never   Smokeless tobacco: Never  Vaping Use   Vaping status: Never Used  Substance and Sexual Activity   Alcohol  use: Yes    Comment: rare   Drug use: No   Sexual activity: Not on file  Other Topics Concern   Not on file  Social History Narrative   Not on file   Social Drivers of Health   Financial Resource Strain: Low Risk  (11/08/2023)   Overall Financial Resource Strain (CARDIA)    Difficulty of Paying Living Expenses: Not hard at all  Food Insecurity: No Food Insecurity (11/08/2023)   Hunger Vital Sign    Worried About Running Out of Food in the Last Year: Never true    Ran Out of Food  in the Last Year: Never true  Transportation Needs: No Transportation Needs (11/08/2023)   PRAPARE - Administrator, Civil Service (Medical): No    Lack of Transportation (Non-Medical): No  Physical Activity: Inactive (11/08/2023)   Exercise Vital Sign    Days of Exercise per Week: 0 days    Minutes of Exercise per Session: Not on file  Stress: No Stress Concern Present (11/08/2023)   [employer] of Occupational Health - Occupational Stress Questionnaire    Feeling of Stress: Not at all  Social Connections: Socially Isolated (11/08/2023)   Social Connection and Isolation Panel    Frequency of Communication with Friends and Family: Once a week    Frequency of Social Gatherings with Friends and Family: Once a week    Attends Religious Services: Never    Database administrator or Organizations: No    Attends Engineer, structural: Not  on file    Marital Status: Never married  Intimate Partner Violence: Not At Risk (03/13/2023)   Humiliation, Afraid, Rape, and Kick questionnaire    Fear of Current or Ex-Partner: No    Emotionally Abused: No    Physically Abused: No    Sexually Abused: No    Family History  Problem Relation Age of Onset   Stroke Father    Diabetes Other    Hyperlipidemia Other    Heart disease Other    Hypertension Other    Cancer Other    Breast cancer Neg Hx    Colon cancer Neg Hx    Esophageal cancer Neg Hx    Stomach cancer Neg Hx    Rectal cancer Neg Hx    Allergic rhinitis Neg Hx    Angioedema Neg Hx    Asthma Neg Hx    Eczema Neg Hx    Urticaria Neg Hx     Allergies  Allergen Reactions   Gabapentin     Dizziness   Other     American cockroaches   Penicillin G Hives   Penicillins Hives and Rash    Outpatient Medications Prior to Visit  Medication Sig   atorvastatin  (LIPITOR) 20 MG tablet TAKE 1 TABLET BY MOUTH EVERY DAY   cetirizine  (ZYRTEC ) 10 MG tablet Take 2 tablets (20 mg total) by mouth 2 (two) times daily as needed for allergies (hives).   diltiazem  (CARDIZEM  CD) 120 MG 24 hr capsule TAKE 1 CAPSULE BY MOUTH EVERY DAY   hydrochlorothiazide  (MICROZIDE ) 12.5 MG capsule TAKE 1 CAPSULE BY MOUTH EVERY DAY   HYDROcodone -acetaminophen  (NORCO/VICODIN) 5-325 MG tablet Take 1 tablet by mouth 3 (three) times daily as needed.   hydrOXYzine  (ATARAX ) 10 MG tablet Take 1 tablet (10 mg total) by mouth daily as needed for itching (or hives).   triamcinolone  ointment (KENALOG ) 0.5 % Apply 1 Application topically 2 (two) times daily. (Patient taking differently: Apply 1 Application topically 2 (two) times daily as needed (itching).)   Facility-Administered Medications Prior to Visit  Medication Dose Route Frequency Provider   gadopentetate dimeglumine  (MAGNEVIST ) injection 20 mL  20 mL Intravenous Once PRN Sater, Charlie LABOR, MD    ROS     Objective:   BP (!) 166/88   Pulse (!)  105   Ht 5' (1.524 m)   Wt 241 lb (109.3 kg)   SpO2 98%   BMI 47.07 kg/m   Vitals:   11/12/23 0759  BP: (!) 166/88  Pulse: (!) 105  Height: 5' (1.524 m)  Weight: 241 lb (  109.3 kg)  SpO2: 98%  BMI (Calculated): 47.07    Physical Exam Vitals and nursing note reviewed.  Constitutional:      Appearance: Normal appearance. She is obese.  HENT:     Head: Normocephalic.     Mouth/Throat:     Mouth: Mucous membranes are moist.  Eyes:     Pupils: Pupils are equal, round, and reactive to light.  Cardiovascular:     Rate and Rhythm: Normal rate and regular rhythm.  Pulmonary:     Effort: Pulmonary effort is normal.     Breath sounds: Normal breath sounds.  Abdominal:     General: Bowel sounds are normal.     Palpations: Abdomen is soft.  Musculoskeletal:        General: Tenderness present.     Cervical back: Normal range of motion.  Skin:    General: Skin is warm and dry.  Neurological:     Mental Status: She is alert and oriented to person, place, and time.  Psychiatric:        Mood and Affect: Mood normal.        Behavior: Behavior normal.      No results found for any visits on 11/12/23.  Recent Results (from the past 2160 hours)  BMP8+eGFR     Status: Abnormal   Collection Time: 09/20/23 10:57 AM  Result Value Ref Range   Glucose 105 (H) 70 - 99 mg/dL   BUN 10 8 - 27 mg/dL   Creatinine, Ser 9.24 0.57 - 1.00 mg/dL   eGFR 89 >40 fO/fpw/8.26   BUN/Creatinine Ratio 13 12 - 28   Sodium 140 134 - 144 mmol/L   Potassium 3.9 3.5 - 5.2 mmol/L   Chloride 103 96 - 106 mmol/L   CO2 22 20 - 29 mmol/L   Calcium  9.1 8.7 - 10.3 mg/dL  Fe+TIBC+Fer     Status: Abnormal   Collection Time: 09/20/23 10:57 AM  Result Value Ref Range   Total Iron  Binding Capacity 313 250 - 450 ug/dL   UIBC 721 881 - 630 ug/dL   Iron  35 27 - 139 ug/dL   Iron  Saturation 11 (L) 15 - 55 %   Ferritin 84 15 - 150 ng/mL  CBC with Differential/Platelet     Status: Abnormal   Collection Time:  09/20/23 10:57 AM  Result Value Ref Range   WBC 7.4 3.4 - 10.8 x10E3/uL   RBC 4.48 3.77 - 5.28 x10E6/uL   Hemoglobin 13.6 11.1 - 15.9 g/dL   Hematocrit 58.8 65.9 - 46.6 %   MCV 92 79 - 97 fL   MCH 30.4 26.6 - 33.0 pg   MCHC 33.1 31.5 - 35.7 g/dL   RDW 87.2 88.2 - 84.5 %   Platelets 291 150 - 450 x10E3/uL   Neutrophils 56 Not Estab. %   Lymphs 29 Not Estab. %   Monocytes 8 Not Estab. %   Eos 6 Not Estab. %   Basos 1 Not Estab. %   Neutrophils Absolute 4.1 1.4 - 7.0 x10E3/uL   Lymphocytes Absolute 2.2 0.7 - 3.1 x10E3/uL   Monocytes Absolute 0.6 0.1 - 0.9 x10E3/uL   EOS (ABSOLUTE) 0.5 (H) 0.0 - 0.4 x10E3/uL   Basophils Absolute 0.1 0.0 - 0.2 x10E3/uL   Immature Granulocytes 0 Not Estab. %   Immature Grans (Abs) 0.0 0.0 - 0.1 x10E3/uL  TSH + free T4     Status: None   Collection Time: 09/20/23 10:57 AM  Result Value Ref Range  TSH 1.810 0.450 - 4.500 uIU/mL   Free T4 1.39 0.82 - 1.77 ng/dL  Hemoglobin J8R     Status: Abnormal   Collection Time: 09/20/23 10:57 AM  Result Value Ref Range   Hgb A1c MFr Bld 5.7 (H) 4.8 - 5.6 %    Comment:          Prediabetes: 5.7 - 6.4          Diabetes: >6.4          Glycemic control for adults with diabetes: <7.0    Est. average glucose Bld gHb Est-mCnc 117 mg/dL  Lipid Profile     Status: None   Collection Time: 09/20/23 10:57 AM  Result Value Ref Range   Cholesterol, Total 125 100 - 199 mg/dL   Triglycerides 867 0 - 149 mg/dL   HDL 49 >60 mg/dL   VLDL Cholesterol Cal 23 5 - 40 mg/dL   LDL Chol Calc (NIH) 53 0 - 99 mg/dL   Chol/HDL Ratio 2.6 0.0 - 4.4 ratio    Comment:                                   T. Chol/HDL Ratio                                             Men  Women                               1/2 Avg.Risk  3.4    3.3                                   Avg.Risk  5.0    4.4                                2X Avg.Risk  9.6    7.1                                3X Avg.Risk 23.4   11.0       Assessment & Plan: 1) Right hip  replacement scheduled - Completed pre-op paperwork 2) Patient needs paperwork faxed to Ortho, nursing will perform 3) Patient instructed to follow up postop (surgery TBD)          Problem List Items Addressed This Visit   None   No follow-ups on file.   Total time spent: 20 minutes  Neale Carpen, NP  11/12/2023   This document may have been prepared by [facility] Voice Recognition software and as such may include unintentional dictation errors.

## 2023-11-12 NOTE — Patient Instructions (Signed)
 1) Completed pre-op paperwork 2) Patient needs paperwork faxed to Ortho, nursing will perform 3) Patient instructed to follow up postop (surgery TBD)

## 2023-11-13 ENCOUNTER — Encounter: Payer: Self-pay | Admitting: Nurse Practitioner

## 2023-11-15 NOTE — Progress Notes (Signed)
 Sent message, via epic in basket, requesting orders in epic from Careers adviser.

## 2023-11-18 NOTE — Patient Instructions (Addendum)
 SURGICAL WAITING ROOM VISITATION Patients having surgery or a procedure may have no more than 2 support people in the waiting area - these visitors may rotate.    Children under the age of 54 must have an adult with them who is not the patient.  If the patient needs to stay at the hospital during part of their recovery, the visitor guidelines for inpatient rooms apply. Pre-op nurse will coordinate an appropriate time for 1 support person to accompany patient in pre-op.  This support person may not rotate.    Please refer to the [facility] website for the visitor guidelines for Inpatients (after your surgery is over and you are in a regular room).       Your procedure is scheduled on: 11-28-23   Report to [facility] Main Entrance    Report to admitting at 5:15 AM   Call this number if you have problems the morning of surgery [phone]   Do not eat food or liquids :After Midnight.but may have sips of water  with meds   FOLLOW  ANY ADDITIONAL PRE OP INSTRUCTIONS YOU RECEIVED FROM YOUR SURGEON'S OFFICE!!!     Oral Hygiene is also important to reduce your risk of infection.                                    Remember - BRUSH YOUR TEETH THE MORNING OF SURGERY WITH YOUR REGULAR TOOTHPASTE   Take these medicines the morning of surgery with A SIP OF WATER :    Atorvastatin    Zyrtec    Diltiazem   Stop all vitamins and herbal supplements 7 days before surgery             You may not have any metal on your body including hair pins, jewelry, and body piercing             Do not wear make-up, lotions, powders, perfumes, or deodorant  Do not wear nail polish including gel and S&S, artificial/acrylic nails, or any other type of covering on natural nails including finger and toenails. If you have artificial nails, gel coating, etc. that needs to be removed by a nail salon please have this removed prior to surgery or surgery may need to be canceled/ delayed if the surgeon/ anesthesia  feels like they are unable to be safely monitored.   Do not shave  48 hours prior to surgery.          Do not bring valuables to the hospital. [facility] IS NOT RESPONSIBLE   FOR VALUABLES.   Contacts, dentures or bridgework may not be worn into surgery.   Bring small overnight bag day of surgery.   DO NOT BRING YOUR HOME MEDICATIONS TO THE HOSPITAL. PHARMACY WILL DISPENSE MEDICATIONS LISTED ON YOUR MEDICATION LIST TO YOU DURING YOUR ADMISSION IN THE HOSPITAL!   Special Instructions: Bring a copy of your healthcare power of attorney and living will documents the day of surgery if you haven't scanned them before.              Please read over the following fact sheets you were given: IF YOU HAVE QUESTIONS ABOUT YOUR PRE-OP INSTRUCTIONS PLEASE CALL [phone] Gwen  If you received a COVID test during your pre-op visit  it is requested that you wear a mask when out in public, stay away from anyone that may not be feeling well and notify your surgeon if you  develop symptoms. If you test positive for Covid or have been in contact with anyone that has tested positive in the last 10 days please notify you surgeon.    Pre-operative 5 CHG Bath Instructions   You can play a key role in reducing the risk of infection after surgery. Your skin needs to be as free of germs as possible. You can reduce the number of germs on your skin by washing with CHG (chlorhexidine  gluconate) soap before surgery. CHG is an antiseptic soap that kills germs and continues to kill germs even after washing.   DO NOT use if you have an allergy to chlorhexidine /CHG or antibacterial soaps. If your skin becomes reddened or irritated, stop using the CHG and notify one of our RNs at [phone].   Please shower with the CHG soap starting 4 days before surgery using the following schedule:     Please keep in mind the following:  DO NOT shave, including legs and underarms, starting the day of your first shower.   You may  shave your face at any point before/day of surgery.  Place clean sheets on your bed the day you start using CHG soap. Use a clean washcloth (not used since being washed) for each shower. DO NOT sleep with pets once you start using the CHG.   CHG Shower Instructions:  If you choose to wash your hair and private area, wash first with your normal shampoo/soap.  After you use shampoo/soap, rinse your hair and body thoroughly to remove shampoo/soap residue.  Turn the water  OFF and apply about 3 tablespoons (45 ml) of CHG soap to a CLEAN washcloth.  Apply CHG soap ONLY FROM YOUR NECK DOWN TO YOUR TOES (washing for 3-5 minutes)  DO NOT use CHG soap on face, private areas, open wounds, or sores.  Pay special attention to the area where your surgery is being performed.  If you are having back surgery, having someone wash your back for you may be helpful. Wait 2 minutes after CHG soap is applied, then you may rinse off the CHG soap.  Pat dry with a clean towel  Put on clean clothes/pajamas   If you choose to wear lotion, please use ONLY the CHG-compatible lotions on the back of this paper.     Additional instructions for the day of surgery: DO NOT APPLY any lotions, deodorants, cologne, or perfumes.   Put on clean/comfortable clothes.  Brush your teeth.  Ask your nurse before applying any prescription medications to the skin.      CHG Compatible Lotions   Aveeno Moisturizing lotion  Cetaphil Moisturizing Cream  Cetaphil Moisturizing Lotion  Clairol Herbal Essence Moisturizing Lotion, Dry Skin  Clairol Herbal Essence Moisturizing Lotion, Extra Dry Skin  Clairol Herbal Essence Moisturizing Lotion, Normal Skin  Curel Age Defying Therapeutic Moisturizing Lotion with Alpha Hydroxy  Curel Extreme Care Body Lotion  Curel Soothing Hands Moisturizing Hand Lotion  Curel Therapeutic Moisturizing Cream, Fragrance-Free  Curel Therapeutic Moisturizing Lotion, Fragrance-Free  Curel Therapeutic  Moisturizing Lotion, Original Formula  Eucerin Daily Replenishing Lotion  Eucerin Dry Skin Therapy Plus Alpha Hydroxy Crme  Eucerin Dry Skin Therapy Plus Alpha Hydroxy Lotion  Eucerin Original Crme  Eucerin Original Lotion  Eucerin Plus Crme Eucerin Plus Lotion  Eucerin TriLipid Replenishing Lotion  Keri Anti-Bacterial Hand Lotion  Keri Deep Conditioning Original Lotion Dry Skin Formula Softly Scented  Keri Deep Conditioning Original Lotion, Fragrance Free Sensitive Skin Formula  Keri Lotion Fast Absorbing Fragrance Free Sensitive Skin Formula  Keri Lotion Fast Absorbing Softly Scented Dry Skin Formula  Keri Original Lotion  Keri Skin Renewal Lotion Keri Silky Smooth Lotion  Keri Silky Smooth Sensitive Skin Lotion  Nivea Body Creamy Conditioning Oil  Nivea Body Extra Enriched Lotion  Nivea Body Original Lotion  Nivea Body Sheer Moisturizing Lotion Nivea Crme  Nivea Skin Firming Lotion  NutraDerm 30 Skin Lotion  NutraDerm Skin Lotion  NutraDerm Therapeutic Skin Cream  NutraDerm Therapeutic Skin Lotion  ProShield Protective Hand Cream  Provon moisturizing lotion   PATIENT SIGNATURE_________________________________  NURSE SIGNATURE__________________________________  ________________________________________________________________________    Nasario Exon  An incentive spirometer is a tool that can help keep your lungs clear and active. This tool measures how well you are filling your lungs with each breath. Taking long deep breaths may help reverse or decrease the chance of developing breathing (pulmonary) problems (especially infection) following: A long period of time when you are unable to move or be active. BEFORE THE PROCEDURE  If the spirometer includes an indicator to show your best effort, your nurse or respiratory therapist will set it to a desired goal. If possible, sit up straight or lean slightly forward. Try not to slouch. Hold the incentive spirometer  in an upright position. INSTRUCTIONS FOR USE  Sit on the edge of your bed if possible, or sit up as far as you can in bed or on a chair. Hold the incentive spirometer in an upright position. Breathe out normally. Place the mouthpiece in your mouth and seal your lips tightly around it. Breathe in slowly and as deeply as possible, raising the piston or the ball toward the top of the column. Hold your breath for 3-5 seconds or for as long as possible. Allow the piston or ball to fall to the bottom of the column. Remove the mouthpiece from your mouth and breathe out normally. Rest for a few seconds and repeat Steps 1 through 7 at least 10 times every 1-2 hours when you are awake. Take your time and take a few normal breaths between deep breaths. The spirometer may include an indicator to show your best effort. Use the indicator as a goal to work toward during each repetition. After each set of 10 deep breaths, practice coughing to be sure your lungs are clear. If you have an incision (the cut made at the time of surgery), support your incision when coughing by placing a pillow or rolled up towels firmly against it. Once you are able to get out of bed, walk around indoors and cough well. You may stop using the incentive spirometer when instructed by your caregiver.  RISKS AND COMPLICATIONS Take your time so you do not get dizzy or light-headed. If you are in pain, you may need to take or ask for pain medication before doing incentive spirometry. It is harder to take a deep breath if you are having pain. AFTER USE Rest and breathe slowly and easily. It can be helpful to keep track of a log of your progress. Your caregiver can provide you with a simple table to help with this. If you are using the spirometer at home, follow these instructions: SEEK MEDICAL CARE IF:  You are having difficultly using the spirometer. You have trouble using the spirometer as often as instructed. Your pain medication is  not giving enough relief while using the spirometer. You develop fever of 100.5 F (38.1 C) or higher. SEEK IMMEDIATE MEDICAL CARE IF:  You cough up bloody sputum that had not been present  before. You develop fever of 102 F (38.9 C) or greater. You develop worsening pain at or near the incision site. MAKE SURE YOU:  Understand these instructions. Will watch your condition. Will get help right away if you are not doing well or get worse. Document Released: 08/27/2006 Document Revised: 07/09/2011 Document Reviewed: 10/28/2006 Exit[employer] Patient Information 2014 [employer], [place].   ________________________________________________________________________ WHAT IS A BLOOD TRANSFUSION? Blood Transfusion Information  A transfusion is the replacement of blood or some of its parts. Blood is made up of multiple cells which provide different functions. Red blood cells carry oxygen and are used for blood loss replacement. White blood cells fight against infection. Platelets control bleeding. Plasma helps clot blood. Other blood products are available for specialized needs, such as hemophilia or other clotting disorders. BEFORE THE TRANSFUSION  Who gives blood for transfusions?  Healthy volunteers who are fully evaluated to make sure their blood is safe. This is blood bank blood. Transfusion therapy is the safest it has ever been in the practice of medicine. Before blood is taken from a donor, a complete history is taken to make sure that person has no history of diseases nor engages in risky social behavior (examples are intravenous drug use or sexual activity with multiple partners). The donor's travel history is screened to minimize risk of transmitting infections, such as malaria. The donated blood is tested for signs of infectious diseases, such as HIV and hepatitis. The blood is then tested to be sure it is compatible with you in order to minimize the chance of a transfusion reaction. If you or a  relative donates blood, this is often done in anticipation of surgery and is not appropriate for emergency situations. It takes many days to process the donated blood. RISKS AND COMPLICATIONS Although transfusion therapy is very safe and saves many lives, the main dangers of transfusion include:  Getting an infectious disease. Developing a transfusion reaction. This is an allergic reaction to something in the blood you were given. Every precaution is taken to prevent this. The decision to have a blood transfusion has been considered carefully by your caregiver before blood is given. Blood is not given unless the benefits outweigh the risks. AFTER THE TRANSFUSION Right after receiving a blood transfusion, you will usually feel much better and more energetic. This is especially true if your red blood cells have gotten low (anemic). The transfusion raises the level of the red blood cells which carry oxygen, and this usually causes an energy increase. The nurse administering the transfusion will monitor you carefully for complications. HOME CARE INSTRUCTIONS  No special instructions are needed after a transfusion. You may find your energy is better. Speak with your caregiver about any limitations on activity for underlying diseases you may have. SEEK MEDICAL CARE IF:  Your condition is not improving after your transfusion. You develop redness or irritation at the intravenous (IV) site. SEEK IMMEDIATE MEDICAL CARE IF:  Any of the following symptoms occur over the next 12 hours: Shaking chills. You have a temperature by mouth above 102 F (38.9 C), not controlled by medicine. Chest, back, or muscle pain. People around you feel you are not acting correctly or are confused. Shortness of breath or difficulty breathing. Dizziness and fainting. You get a rash or develop hives. You have a decrease in urine output. Your urine turns a dark color or changes to pink, red, or brown. Any of the following  symptoms occur over the next 10 days: You have a temperature  by mouth above 102 F (38.9 C), not controlled by medicine. Shortness of breath. Weakness after normal activity. The white part of the eye turns yellow (jaundice). You have a decrease in the amount of urine or are urinating less often. Your urine turns a dark color or changes to pink, red, or brown. Document Released: 04/13/2000 Document Revised: 07/09/2011 Document Reviewed: 12/01/2007 [facility] Patient Information 2014 [place], [place].  _______________________________________________________________________

## 2023-11-19 ENCOUNTER — Ambulatory Visit: Payer: Self-pay | Admitting: Student

## 2023-11-19 ENCOUNTER — Encounter (HOSPITAL_COMMUNITY): Payer: Self-pay

## 2023-11-19 ENCOUNTER — Other Ambulatory Visit: Payer: Self-pay

## 2023-11-19 ENCOUNTER — Encounter (HOSPITAL_COMMUNITY)
Admission: RE | Admit: 2023-11-19 | Discharge: 2023-11-19 | Disposition: A | Discharge status: Home / Self Care | Source: Ambulatory Visit | Attending: Orthopedic Surgery | Admitting: Orthopedic Surgery

## 2023-11-19 VITALS — BP 170/89 | HR 78 | Temp 98.2°F | Resp 18 | Ht 60.0 in | Wt 237.0 lb

## 2023-11-19 DIAGNOSIS — Z01818 Encounter for other preprocedural examination: Secondary | ICD-10-CM

## 2023-11-19 DIAGNOSIS — I10 Essential (primary) hypertension: Secondary | ICD-10-CM | POA: Insufficient documentation

## 2023-11-19 DIAGNOSIS — Z01812 Encounter for preprocedural laboratory examination: Secondary | ICD-10-CM | POA: Insufficient documentation

## 2023-11-19 LAB — SURGICAL PCR SCREEN
MRSA, PCR: NEGATIVE
Staphylococcus aureus: NEGATIVE

## 2023-11-19 LAB — BASIC METABOLIC PANEL WITH GFR
Anion gap: 8 (ref 5–15)
BUN: 12 mg/dL (ref 8–23)
CO2: 27 mmol/L (ref 22–32)
Calcium: 9 mg/dL (ref 8.9–10.3)
Chloride: 104 mmol/L (ref 98–111)
Creatinine, Ser: 0.75 mg/dL (ref 0.44–1.00)
GFR, Estimated: >60 mL/min (ref >60)
Glucose, Bld: 98 mg/dL (ref 70–99)
Potassium: 4.3 mmol/L (ref 3.5–5.1)
Sodium: 139 mmol/L (ref 135–145)

## 2023-11-19 LAB — CBC
HCT: 46.2 % — ABNORMAL HIGH (ref 36.0–46.0)
Hemoglobin: 14.6 g/dL (ref 12.0–15.0)
MCH: 30 pg (ref 26.0–34.0)
MCHC: 31.6 g/dL (ref 30.0–36.0)
MCV: 94.9 fL (ref 80.0–100.0)
Platelets: 305 K/uL (ref 150–400)
RBC: 4.87 MIL/uL (ref 3.87–5.11)
RDW: 12.7 % (ref 11.5–15.5)
WBC: 8.4 K/uL (ref 4.0–10.5)
nRBC: 0 % (ref 0.0–0.2)

## 2023-11-19 NOTE — Progress Notes (Addendum)
 COVID Vaccine received:  []  No [x]  Yes Date of any COVID positive Test in last 90 days: no PCP - Hilario Falter FNP Cardiologist - Dr. Peter [place]   Chest x-ray - 12/24/22 Epic EKG -  01/31/23 Epic Stress Test - 11/24/10 Epic ECHO - 04/18/15 Epic Cardiac Cath -   Bowel Prep - [x]  No  []   Yes ______  Pacemaker / ICD device [x]  No []  Yes   Spinal Cord Stimulator:[x]  No []  Yes       History of Sleep Apnea? [x]  No []  Yes   CPAP used?- [x]  No []  Yes    Does the patient monitor blood sugar?          [x]  No []  Yes  []  N/A  Patient has: []  NO Hx DM   [x]  Pre-DM                 []  DM1  []   DM2 Does patient have a [employer] or Dexacom? []  No []  Yes   Fasting Blood Sugar Ranges-  Checks Blood Sugar _____ times a day  GLP1 agonist / usual dose - no GLP1 instructions:  SGLT-2 inhibitors / usual dose - no  Blood Thinner / Instructions:no Aspirin  Instructions:no  Comments:   Activity level: Patient is able to climb a flight of stairs without difficulty; [x]  No CP  [x]  No SOB,  Patient can  perform ADLs without assistance.   Anesthesia review:   Patient denies shortness of breath, fever, cough and chest pain at PAT appointment.  Patient verbalized understanding and agreement to the Pre-Surgical Instructions that were given to them at this PAT appointment. Patient was also educated of the need to review these PAT instructions again prior to his/her surgery.I reviewed the appropriate phone numbers to call if they have any and questions or concerns.

## 2023-11-22 ENCOUNTER — Ambulatory Visit: Payer: Self-pay | Admitting: Student

## 2023-11-22 NOTE — H&P (Signed)
 TOTAL HIP ADMISSION H&P  Patient is admitted for right total hip arthroplasty.  Subjective:  Chief Complaint: right hip pain  HPI: [name] [name], 63 y.o. female, has a history of pain and functional disability in the right hip(s) due to arthritis and patient has failed non-surgical conservative treatments for greater than 12 weeks to include NSAID's and/or analgesics, corticosteriod injections, flexibility and strengthening excercises, use of assistive devices, and activity modification.  Onset of symptoms was gradual starting 1 years ago with rapidlly worsening course since that time.The patient noted no past surgery on the right hip(s).  Patient currently rates pain in the right hip at 10 out of 10 with activity. Patient has night pain, worsening of pain with activity and weight bearing, trendelenberg gait, pain that interfers with activities of daily living, and pain with passive range of motion. Patient has evidence of subchondral cysts, subchondral sclerosis, periarticular osteophytes, and joint space narrowing by imaging studies. This condition presents safety issues increasing the risk of falls.  There is no current active infection.  Patient Active Problem List   Diagnosis Date Noted   Hip pain, chronic, right 11/12/2023   Prediabetes 09/20/2023   DDD (degenerative disc disease), cervical 07/19/2023   Sinusitis 07/19/2023   Iron  deficiency anemia 05/23/2023   Cervical radiculopathy 04/16/2023   Colon cancer screening 08/01/2022   History of colonic polyps 08/01/2022   Abnormal brain MRI 07/02/2017   Insomnia 02/26/2017   Headache disorder 01/24/2017   Vestibular migraine 12/25/2016   Sensorineural hearing loss (SNHL), bilateral 12/25/2016   Vitamin D  deficiency 08/02/2015   HTN (hypertension) 05/25/2015   Syncope 02/17/2015   DDD (degenerative disc disease), lumbar 08/29/2013   Past Medical History:  Diagnosis Date   Anxiety 2008   seen in ED /w chest pain but told that  she was having anxiety   Arthritis    Colon polyps    History of kidney stones 1999   History of migraine    History of stress test 2010   done due to chest pain /w Dr. Ladona. Pt. told that it was wnl.   Hives of unknown origin    told by PCP [facility]) to use Zrytec BID to control hives    Hypertension    Kidney stones    Neuromuscular disorder (HCC)    back pain   Obesity, unspecified    Sleep difficulties    has had a sleep study - t old that she didn't meet criteria for machine    Syncope    due to pain    Past Surgical History:  Procedure Laterality Date   CHOLECYSTECTOMY     COLONOSCOPY  2016   DECOMPRESSIVE LUMBAR LAMINECTOMY LEVEL 1 N/A 10/28/2013   Procedure: DECOMPRESSION L4-5;  Surgeon: Donaciano Sprang, MD;  Location: [facility];  Service: Orthopedics;  Laterality: N/A;   KNEE ARTHROPLASTY Right 12/24/2018   Procedure: COMPUTER ASSISTED TOTAL KNEE ARTHROPLASTY;  Surgeon: Fidel Rogue, MD;  Location: [facility];  Service: Orthopedics;  Laterality: Right;   KNEE ARTHROPLASTY Left 09/17/2019   Procedure: COMPUTER ASSISTED TOTAL KNEE ARTHROPLASTY;  Surgeon: Fidel Rogue, MD;  Location: [facility];  Service: Orthopedics;  Laterality: Left;   KNEE ARTHROSCOPY  12/2008   left knee   KNEE ARTHROSCOPY Right 01/27/2018   PARTIAL HYSTERECTOMY  10/1994   thumb surgery Right 03/1975    Current Outpatient Medications  Medication Sig Dispense Refill Last Dose/Taking   atorvastatin  (LIPITOR) 20 MG tablet TAKE 1 TABLET BY MOUTH EVERY DAY  90 tablet 3    cetirizine  (ZYRTEC ) 10 MG tablet Take 2 tablets (20 mg total) by mouth 2 (two) times daily as needed for allergies (hives). (Patient taking differently: Take 10 mg by mouth 2 (two) times daily.) 120 tablet 5    diltiazem  (CARDIZEM  CD) 120 MG 24 hr capsule TAKE 1 CAPSULE BY MOUTH EVERY DAY 90 capsule 2    ferrous sulfate 325 (65 FE) MG EC tablet Take 325 mg by mouth in the morning.      hydrochlorothiazide  (MICROZIDE ) 12.5 MG capsule TAKE 1  CAPSULE BY MOUTH EVERY DAY 90 capsule 1    HYDROcodone -acetaminophen  (NORCO/VICODIN) 5-325 MG tablet Take 1 tablet by mouth 3 (three) times daily as needed (pain).      Misc Natural Products (BEET ROOT PO) Take 2 tablets by mouth in the morning. Super Beets      Multiple Vitamins-Minerals (HAIR SKIN NAILS PO) Take by mouth.      triamcinolone  ointment (KENALOG ) 0.5 % Apply 1 Application topically 2 (two) times daily. (Patient taking differently: Apply 1 Application topically 2 (two) times daily as needed (rash/irritation).) 30 g 0    No current facility-administered medications for this visit.   Facility-Administered Medications Ordered in Other Visits  Medication Dose Route Frequency Provider Last Rate Last Admin   gadopentetate dimeglumine  (MAGNEVIST ) injection 20 mL  20 mL Intravenous Once PRN Sater, Charlie LABOR, MD       Allergies  Allergen Reactions   American Cockroach Other (See Comments)    unknown   Gabapentin     Dizziness   Penicillins Hives and Rash    Social History   Tobacco Use   Smoking status: Never    Passive exposure: Never   Smokeless tobacco: Never  Substance Use Topics   Alcohol  use: Not Currently    Comment: rare    Family History  Problem Relation Age of Onset   Stroke Father    Diabetes Other    Hyperlipidemia Other    Heart disease Other    Hypertension Other    Cancer Other    Breast cancer Neg Hx    Colon cancer Neg Hx    Esophageal cancer Neg Hx    Stomach cancer Neg Hx    Rectal cancer Neg Hx    Allergic rhinitis Neg Hx    Angioedema Neg Hx    Asthma Neg Hx    Eczema Neg Hx    Urticaria Neg Hx      Review of Systems  Musculoskeletal:  Positive for arthralgias and gait problem.  All other systems reviewed and are negative.   Objective:  Physical Exam Constitutional:      Appearance: Normal appearance.  HENT:     Head: Normocephalic and atraumatic.     Nose: Nose normal.     Mouth/Throat:     Mouth: Mucous membranes are moist.      Pharynx: Oropharynx is clear.  Eyes:     Conjunctiva/sclera: Conjunctivae normal.  Cardiovascular:     Rate and Rhythm: Normal rate and regular rhythm.     Pulses: Normal pulses.     Heart sounds: Normal heart sounds.  Pulmonary:     Effort: Pulmonary effort is normal.     Breath sounds: Normal breath sounds.  Abdominal:     General: Abdomen is flat.     Palpations: Abdomen is soft.  Genitourinary:    Comments: deferred Musculoskeletal:     Cervical back: Normal range of motion and neck supple.  Comments: Examination of the right hip reveals no skin wounds or lesions. Pain with flexion and rotation of the hip. Motion restriction noted. Trochanteric tenderness to palpation.   Neurovasclularly intact distally.   Skin:    General: Skin is warm and dry.     Capillary Refill: Capillary refill takes less than 2 seconds.  Neurological:     General: No focal deficit present.     Mental Status: She is alert and oriented to person, place, and time.  Psychiatric:        Mood and Affect: Mood normal.        Behavior: Behavior normal.        Thought Content: Thought content normal.        Judgment: Judgment normal.     Vital signs in last 24 hours: @VSRANGES @  Labs:   Estimated body mass index is 46.29 kg/m as calculated from the following:   Height as of 11/19/23: 5' (1.524 m).   Weight as of 11/19/23: 107.5 kg.   Imaging Review Plain radiographs demonstrate severe degenerative joint disease of the right hip(s). The bone quality appears to be adequate for age and reported activity level.      Assessment/Plan:  End stage arthritis, right hip(s)  The patient history, physical examination, clinical judgement of the provider and imaging studies are consistent with end stage degenerative joint disease of the right hip(s) and total hip arthroplasty is deemed medically necessary. The treatment options including medical management, injection therapy, arthroscopy and  arthroplasty were discussed at length. The risks and benefits of total hip arthroplasty were presented and reviewed. The risks due to aseptic loosening, infection, stiffness, dislocation/subluxation,  thromboembolic complications and other imponderables were discussed.  The patient acknowledged the explanation, agreed to proceed with the plan and consent was signed. Patient is being admitted for inpatient treatment for surgery, pain control, PT, OT, prophylactic antibiotics, VTE prophylaxis, progressive ambulation and ADL's and discharge planning.The patient is planning to be discharged home with HEP after an overnight stay.   Therapy Plans: HEP.  Disposition: Home with sister Planned DVT Prophylaxis: aspirin  81mg  BID DME needed: Has rolling walker and cane.  PCP: Cleared.  TXA: IV Allergies:  - Gabapentin - hives - Loratadine  - hives, rash. - Penicillin - hives.  Anesthesia Concerns: None. BMI: 44.9. Okay per Dr. Fidel.  Last HgbA1c: 5.7 Other: - Pain management Ramos, hydrocodone  5/325mg  2-3x/day.  - Hx stomach ulcers. - Hydrocodone , zofran , methocarbamol .  - 11/19/23: Hgb 14.6, Cr. 0.75, 4.3.    Patient's anticipated LOS is less than 2 midnights, meeting these requirements: - Younger than 61 - Lives within 1 hour of care - Has a competent adult at home to recover with post-op recover - NO history of  - Chronic pain requiring opiods  - Diabetes  - Coronary Artery Disease  - Heart failure  - Heart attack  - Stroke  - DVT/VTE  - Cardiac arrhythmia  - Respiratory Failure/COPD  - Renal failure  - Anemia  - Advanced Liver disease

## 2023-11-22 NOTE — H&P (View-Only) (Signed)
 TOTAL HIP ADMISSION H&P  Patient is admitted for right total hip arthroplasty.  Subjective:  Chief Complaint: right hip pain  HPI: [name] [name], 63 y.o. female, has a history of pain and functional disability in the right hip(s) due to arthritis and patient has failed non-surgical conservative treatments for greater than 12 weeks to include NSAID's and/or analgesics, corticosteriod injections, flexibility and strengthening excercises, use of assistive devices, and activity modification.  Onset of symptoms was gradual starting 1 years ago with rapidlly worsening course since that time.The patient noted no past surgery on the right hip(s).  Patient currently rates pain in the right hip at 10 out of 10 with activity. Patient has night pain, worsening of pain with activity and weight bearing, trendelenberg gait, pain that interfers with activities of daily living, and pain with passive range of motion. Patient has evidence of subchondral cysts, subchondral sclerosis, periarticular osteophytes, and joint space narrowing by imaging studies. This condition presents safety issues increasing the risk of falls.  There is no current active infection.  Patient Active Problem List   Diagnosis Date Noted   Hip pain, chronic, right 11/12/2023   Prediabetes 09/20/2023   DDD (degenerative disc disease), cervical 07/19/2023   Sinusitis 07/19/2023   Iron  deficiency anemia 05/23/2023   Cervical radiculopathy 04/16/2023   Colon cancer screening 08/01/2022   History of colonic polyps 08/01/2022   Abnormal brain MRI 07/02/2017   Insomnia 02/26/2017   Headache disorder 01/24/2017   Vestibular migraine 12/25/2016   Sensorineural hearing loss (SNHL), bilateral 12/25/2016   Vitamin D  deficiency 08/02/2015   HTN (hypertension) 05/25/2015   Syncope 02/17/2015   DDD (degenerative disc disease), lumbar 08/29/2013   Past Medical History:  Diagnosis Date   Anxiety 2008   seen in ED /w chest pain but told that  she was having anxiety   Arthritis    Colon polyps    History of kidney stones 1999   History of migraine    History of stress test 2010   done due to chest pain /w Dr. Ladona. Pt. told that it was wnl.   Hives of unknown origin    told by PCP [facility]) to use Zrytec BID to control hives    Hypertension    Kidney stones    Neuromuscular disorder (HCC)    back pain   Obesity, unspecified    Sleep difficulties    has had a sleep study - t old that she didn't meet criteria for machine    Syncope    due to pain    Past Surgical History:  Procedure Laterality Date   CHOLECYSTECTOMY     COLONOSCOPY  2016   DECOMPRESSIVE LUMBAR LAMINECTOMY LEVEL 1 N/A 10/28/2013   Procedure: DECOMPRESSION L4-5;  Surgeon: Donaciano Sprang, MD;  Location: [facility];  Service: Orthopedics;  Laterality: N/A;   KNEE ARTHROPLASTY Right 12/24/2018   Procedure: COMPUTER ASSISTED TOTAL KNEE ARTHROPLASTY;  Surgeon: Fidel Rogue, MD;  Location: [facility];  Service: Orthopedics;  Laterality: Right;   KNEE ARTHROPLASTY Left 09/17/2019   Procedure: COMPUTER ASSISTED TOTAL KNEE ARTHROPLASTY;  Surgeon: Fidel Rogue, MD;  Location: [facility];  Service: Orthopedics;  Laterality: Left;   KNEE ARTHROSCOPY  12/2008   left knee   KNEE ARTHROSCOPY Right 01/27/2018   PARTIAL HYSTERECTOMY  10/1994   thumb surgery Right 03/1975    Current Outpatient Medications  Medication Sig Dispense Refill Last Dose/Taking   atorvastatin  (LIPITOR) 20 MG tablet TAKE 1 TABLET BY MOUTH EVERY DAY  90 tablet 3    cetirizine  (ZYRTEC ) 10 MG tablet Take 2 tablets (20 mg total) by mouth 2 (two) times daily as needed for allergies (hives). (Patient taking differently: Take 10 mg by mouth 2 (two) times daily.) 120 tablet 5    diltiazem  (CARDIZEM  CD) 120 MG 24 hr capsule TAKE 1 CAPSULE BY MOUTH EVERY DAY 90 capsule 2    ferrous sulfate 325 (65 FE) MG EC tablet Take 325 mg by mouth in the morning.      hydrochlorothiazide  (MICROZIDE ) 12.5 MG capsule TAKE 1  CAPSULE BY MOUTH EVERY DAY 90 capsule 1    HYDROcodone -acetaminophen  (NORCO/VICODIN) 5-325 MG tablet Take 1 tablet by mouth 3 (three) times daily as needed (pain).      Misc Natural Products (BEET ROOT PO) Take 2 tablets by mouth in the morning. Super Beets      Multiple Vitamins-Minerals (HAIR SKIN NAILS PO) Take by mouth.      triamcinolone  ointment (KENALOG ) 0.5 % Apply 1 Application topically 2 (two) times daily. (Patient taking differently: Apply 1 Application topically 2 (two) times daily as needed (rash/irritation).) 30 g 0    No current facility-administered medications for this visit.   Facility-Administered Medications Ordered in Other Visits  Medication Dose Route Frequency Provider Last Rate Last Admin   gadopentetate dimeglumine  (MAGNEVIST ) injection 20 mL  20 mL Intravenous Once PRN Sater, Charlie LABOR, MD       Allergies  Allergen Reactions   American Cockroach Other (See Comments)    unknown   Gabapentin     Dizziness   Penicillins Hives and Rash    Social History   Tobacco Use   Smoking status: Never    Passive exposure: Never   Smokeless tobacco: Never  Substance Use Topics   Alcohol  use: Not Currently    Comment: rare    Family History  Problem Relation Age of Onset   Stroke Father    Diabetes Other    Hyperlipidemia Other    Heart disease Other    Hypertension Other    Cancer Other    Breast cancer Neg Hx    Colon cancer Neg Hx    Esophageal cancer Neg Hx    Stomach cancer Neg Hx    Rectal cancer Neg Hx    Allergic rhinitis Neg Hx    Angioedema Neg Hx    Asthma Neg Hx    Eczema Neg Hx    Urticaria Neg Hx      Review of Systems  Musculoskeletal:  Positive for arthralgias and gait problem.  All other systems reviewed and are negative.   Objective:  Physical Exam Constitutional:      Appearance: Normal appearance.  HENT:     Head: Normocephalic and atraumatic.     Nose: Nose normal.     Mouth/Throat:     Mouth: Mucous membranes are moist.      Pharynx: Oropharynx is clear.  Eyes:     Conjunctiva/sclera: Conjunctivae normal.  Cardiovascular:     Rate and Rhythm: Normal rate and regular rhythm.     Pulses: Normal pulses.     Heart sounds: Normal heart sounds.  Pulmonary:     Effort: Pulmonary effort is normal.     Breath sounds: Normal breath sounds.  Abdominal:     General: Abdomen is flat.     Palpations: Abdomen is soft.  Genitourinary:    Comments: deferred Musculoskeletal:     Cervical back: Normal range of motion and neck supple.  Comments: Examination of the right hip reveals no skin wounds or lesions. Pain with flexion and rotation of the hip. Motion restriction noted. Trochanteric tenderness to palpation.   Neurovasclularly intact distally.   Skin:    General: Skin is warm and dry.     Capillary Refill: Capillary refill takes less than 2 seconds.  Neurological:     General: No focal deficit present.     Mental Status: She is alert and oriented to person, place, and time.  Psychiatric:        Mood and Affect: Mood normal.        Behavior: Behavior normal.        Thought Content: Thought content normal.        Judgment: Judgment normal.     Vital signs in last 24 hours: @VSRANGES @  Labs:   Estimated body mass index is 46.29 kg/m as calculated from the following:   Height as of 11/19/23: 5' (1.524 m).   Weight as of 11/19/23: 107.5 kg.   Imaging Review Plain radiographs demonstrate severe degenerative joint disease of the right hip(s). The bone quality appears to be adequate for age and reported activity level.      Assessment/Plan:  End stage arthritis, right hip(s)  The patient history, physical examination, clinical judgement of the provider and imaging studies are consistent with end stage degenerative joint disease of the right hip(s) and total hip arthroplasty is deemed medically necessary. The treatment options including medical management, injection therapy, arthroscopy and  arthroplasty were discussed at length. The risks and benefits of total hip arthroplasty were presented and reviewed. The risks due to aseptic loosening, infection, stiffness, dislocation/subluxation,  thromboembolic complications and other imponderables were discussed.  The patient acknowledged the explanation, agreed to proceed with the plan and consent was signed. Patient is being admitted for inpatient treatment for surgery, pain control, PT, OT, prophylactic antibiotics, VTE prophylaxis, progressive ambulation and ADL's and discharge planning.The patient is planning to be discharged home with HEP after an overnight stay.   Therapy Plans: HEP.  Disposition: Home with sister Planned DVT Prophylaxis: aspirin  81mg  BID DME needed: Has rolling walker and cane.  PCP: Cleared.  TXA: IV Allergies:  - Gabapentin - hives - Loratadine  - hives, rash. - Penicillin - hives.  Anesthesia Concerns: None. BMI: 44.9. Okay per Dr. Fidel.  Last HgbA1c: 5.7 Other: - Pain management Ramos, hydrocodone  5/325mg  2-3x/day.  - Hx stomach ulcers. - Hydrocodone , zofran , methocarbamol .  - 11/19/23: Hgb 14.6, Cr. 0.75, 4.3.    Patient's anticipated LOS is less than 2 midnights, meeting these requirements: - Younger than 61 - Lives within 1 hour of care - Has a competent adult at home to recover with post-op recover - NO history of  - Chronic pain requiring opiods  - Diabetes  - Coronary Artery Disease  - Heart failure  - Heart attack  - Stroke  - DVT/VTE  - Cardiac arrhythmia  - Respiratory Failure/COPD  - Renal failure  - Anemia  - Advanced Liver disease

## 2023-11-27 NOTE — Anesthesia Preprocedure Evaluation (Signed)
 Anesthesia Evaluation  Patient identified by MRN, date of birth, ID band Patient awake    Reviewed: Allergy & Precautions, NPO status , Patient's Chart, lab work & pertinent test results  Airway Mallampati: I  TM Distance: >3 FB Neck ROM: Full    Dental no notable dental hx. (+) Teeth Intact, Dental Advisory Given   Pulmonary neg pulmonary ROS   Pulmonary exam normal breath sounds clear to auscultation       Cardiovascular hypertension, Normal cardiovascular exam Rhythm:Regular Rate:Normal     Neuro/Psych  Headaches PSYCHIATRIC DISORDERS Anxiety        GI/Hepatic negative GI ROS, Neg liver ROS,,,  Endo/Other    Class 3 obesity (BMI 46)  Renal/GU negative Renal ROS  negative genitourinary   Musculoskeletal  (+) Arthritis ,    Abdominal   Peds  Hematology negative hematology ROS (+)   Anesthesia Other Findings   Reproductive/Obstetrics                              Anesthesia Physical Anesthesia Plan  ASA: 3  Anesthesia Plan: Spinal   Post-op Pain Management: Tylenol  PO (pre-op)*   Induction:   PONV Risk Score and Plan: 2 and Treatment may vary due to age or medical condition, Midazolam , Propofol  infusion, Ondansetron  and Dexamethasone   Airway Management Planned: Natural Airway  Additional Equipment:   Intra-op Plan:   Post-operative Plan:   Informed Consent: I have reviewed the patients History and Physical, chart, labs and discussed the procedure including the risks, benefits and alternatives for the proposed anesthesia with the patient or authorized representative who has indicated his/her understanding and acceptance.     Dental advisory given  Plan Discussed with: CRNA  Anesthesia Plan Comments:          Anesthesia Quick Evaluation

## 2023-11-28 ENCOUNTER — Other Ambulatory Visit: Payer: Self-pay

## 2023-11-28 ENCOUNTER — Ambulatory Visit (HOSPITAL_COMMUNITY)

## 2023-11-28 ENCOUNTER — Ambulatory Visit (HOSPITAL_COMMUNITY): Admitting: Anesthesiology

## 2023-11-28 ENCOUNTER — Encounter (HOSPITAL_COMMUNITY)
Admission: RE | Disposition: A | Discharge status: Home / Self Care | Payer: Self-pay | Source: Home / Self Care | Attending: Orthopedic Surgery

## 2023-11-28 ENCOUNTER — Ambulatory Visit (HOSPITAL_COMMUNITY)
Admission: RE | Admit: 2023-11-28 | Discharge: 2023-11-29 | Disposition: A | Discharge status: Home / Self Care | Attending: Orthopedic Surgery | Admitting: Orthopedic Surgery

## 2023-11-28 ENCOUNTER — Encounter (HOSPITAL_COMMUNITY): Payer: Self-pay | Admitting: Orthopedic Surgery

## 2023-11-28 ENCOUNTER — Ambulatory Visit (HOSPITAL_COMMUNITY): Payer: Self-pay | Admitting: Medical

## 2023-11-28 DIAGNOSIS — E66813 Obesity, class 3: Secondary | ICD-10-CM

## 2023-11-28 DIAGNOSIS — Z6841 Body Mass Index (BMI) 40.0 and over, adult: Secondary | ICD-10-CM

## 2023-11-28 DIAGNOSIS — I10 Essential (primary) hypertension: Secondary | ICD-10-CM | POA: Diagnosis not present

## 2023-11-28 DIAGNOSIS — M1611 Unilateral primary osteoarthritis, right hip: Secondary | ICD-10-CM

## 2023-11-28 DIAGNOSIS — Z96641 Presence of right artificial hip joint: Secondary | ICD-10-CM | POA: Diagnosis present

## 2023-11-28 HISTORY — PX: TOTAL HIP ARTHROPLASTY: SHX124

## 2023-11-28 LAB — TYPE AND SCREEN
ABO/RH(D): B POS
Antibody Screen: NEGATIVE

## 2023-11-28 LAB — GLUCOSE, CAPILLARY: Glucose-Capillary: 150 mg/dL — ABNORMAL HIGH (ref 70–99)

## 2023-11-28 SURGERY — ARTHROPLASTY, HIP, TOTAL, ANTERIOR APPROACH
Anesthesia: General | Site: Hip | Laterality: Right

## 2023-11-28 MED ORDER — ATORVASTATIN CALCIUM 20 MG PO TABS
20.0000 mg | ORAL_TABLET | Freq: Every day | ORAL | Status: DC
  Administered 2023-11-29: 20 mg via ORAL
  Filled 2023-11-28: qty 1

## 2023-11-28 MED ORDER — DEXAMETHASONE SODIUM PHOSPHATE 10 MG/ML IJ SOLN
INTRAMUSCULAR | Status: DC | PRN
  Administered 2023-11-28: 10 mg via INTRAVENOUS

## 2023-11-28 MED ORDER — CHLORHEXIDINE GLUCONATE 0.12 % MT SOLN
15.0000 mL | Freq: Once | OROMUCOSAL | Status: AC
  Administered 2023-11-28: 15 mL via OROMUCOSAL

## 2023-11-28 MED ORDER — SUGAMMADEX SODIUM 200 MG/2ML IV SOLN
INTRAVENOUS | Status: AC
  Filled 2023-11-28: qty 2

## 2023-11-28 MED ORDER — CEFAZOLIN SODIUM-DEXTROSE 2-4 GM/100ML-% IV SOLN
2.0000 g | Freq: Four times a day (QID) | INTRAVENOUS | Status: AC
  Administered 2023-11-28 (×2): 2 g via INTRAVENOUS
  Filled 2023-11-28 (×2): qty 100

## 2023-11-28 MED ORDER — PROPOFOL 10 MG/ML IV BOLUS
INTRAVENOUS | Status: AC
Start: 2023-11-28 — End: 2023-11-28
  Filled 2023-11-28: qty 20

## 2023-11-28 MED ORDER — KETAMINE HCL 10 MG/ML IJ SOLN
INTRAMUSCULAR | Status: DC | PRN
  Administered 2023-11-28: 30 mg via INTRAVENOUS

## 2023-11-28 MED ORDER — PHENYLEPHRINE 80 MCG/ML (10ML) SYRINGE FOR IV PUSH (FOR BLOOD PRESSURE SUPPORT)
PREFILLED_SYRINGE | INTRAVENOUS | Status: AC
  Filled 2023-11-28: qty 10

## 2023-11-28 MED ORDER — PROPOFOL 1000 MG/100ML IV EMUL
INTRAVENOUS | Status: AC
  Filled 2023-11-28: qty 100

## 2023-11-28 MED ORDER — ISOPROPYL ALCOHOL 70 % SOLN
Status: DC | PRN
  Administered 2023-11-28: 1 via TOPICAL

## 2023-11-28 MED ORDER — ASPIRIN 81 MG PO CHEW
81.0000 mg | CHEWABLE_TABLET | Freq: Two times a day (BID) | ORAL | Status: DC
  Administered 2023-11-28 – 2023-11-29 (×2): 81 mg via ORAL
  Filled 2023-11-28 (×2): qty 1

## 2023-11-28 MED ORDER — POVIDONE-IODINE 10 % EX SWAB
2.0000 | Freq: Once | CUTANEOUS | Status: AC
  Administered 2023-11-28: 2 via TOPICAL

## 2023-11-28 MED ORDER — PROPOFOL 10 MG/ML IV BOLUS
INTRAVENOUS | Status: DC | PRN
  Administered 2023-11-28: 25 ug/kg/min via INTRAVENOUS

## 2023-11-28 MED ORDER — PHENYLEPHRINE HCL (PRESSORS) 10 MG/ML IV SOLN
INTRAVENOUS | Status: AC
  Filled 2023-11-28: qty 1

## 2023-11-28 MED ORDER — FENTANYL CITRATE (PF) 100 MCG/2ML IJ SOLN
INTRAMUSCULAR | Status: DC | PRN
  Administered 2023-11-28: 25 ug via INTRAVENOUS

## 2023-11-28 MED ORDER — METHOCARBAMOL 500 MG PO TABS: 500.0000 mg | ORAL_TABLET | Freq: Four times a day (QID) | ORAL | Status: DC | PRN

## 2023-11-28 MED ORDER — ALUM & MAG HYDROXIDE-SIMETH 200-200-20 MG/5ML PO SUSP: 30.0000 mL | ORAL | Status: DC | PRN

## 2023-11-28 MED ORDER — ACETAMINOPHEN 500 MG PO TABS: 1000.0000 mg | ORAL_TABLET | Freq: Once | ORAL | Status: DC

## 2023-11-28 MED ORDER — SODIUM CHLORIDE 0.9 % IV SOLN: INTRAVENOUS | Status: DC

## 2023-11-28 MED ORDER — LORATADINE 10 MG PO TABS
10.0000 mg | ORAL_TABLET | Freq: Every day | ORAL | Status: DC
  Administered 2023-11-29: 10 mg via ORAL
  Filled 2023-11-28: qty 1

## 2023-11-28 MED ORDER — OXYCODONE HCL 5 MG PO TABS: 5.0000 mg | ORAL_TABLET | Freq: Once | ORAL | Status: DC | PRN

## 2023-11-28 MED ORDER — EPHEDRINE SULFATE (PRESSORS) 50 MG/ML IJ SOLN
INTRAMUSCULAR | Status: DC | PRN
  Administered 2023-11-28: 10 mg via INTRAVENOUS

## 2023-11-28 MED ORDER — SUGAMMADEX SODIUM 200 MG/2ML IV SOLN
INTRAVENOUS | Status: DC | PRN
  Administered 2023-11-28: 400 mg via INTRAVENOUS

## 2023-11-28 MED ORDER — PROPOFOL 500 MG/50ML IV EMUL
INTRAVENOUS | Status: AC
  Filled 2023-11-28: qty 50

## 2023-11-28 MED ORDER — SODIUM CHLORIDE (PF) 0.9 % IJ SOLN
INTRAMUSCULAR | Status: AC
  Filled 2023-11-28: qty 30

## 2023-11-28 MED ORDER — MIDAZOLAM HCL 2 MG/2ML IJ SOLN
INTRAMUSCULAR | Status: AC
  Filled 2023-11-28: qty 2

## 2023-11-28 MED ORDER — MIDAZOLAM HCL 5 MG/5ML IJ SOLN
INTRAMUSCULAR | Status: DC | PRN
  Administered 2023-11-28: 1 mg via INTRAVENOUS
  Administered 2023-11-28: .5 mg via INTRAVENOUS
  Administered 2023-11-28: 1 mg via INTRAVENOUS

## 2023-11-28 MED ORDER — PROPOFOL 10 MG/ML IV BOLUS
INTRAVENOUS | Status: AC
  Filled 2023-11-28: qty 20

## 2023-11-28 MED ORDER — ONDANSETRON HCL 4 MG/2ML IJ SOLN
INTRAMUSCULAR | Status: DC | PRN
  Administered 2023-11-28: 4 mg via INTRAVENOUS

## 2023-11-28 MED ORDER — FERROUS SULFATE 325 (65 FE) MG PO TABS
325.0000 mg | ORAL_TABLET | Freq: Three times a day (TID) | ORAL | Status: DC
  Administered 2023-11-29: 325 mg via ORAL
  Filled 2023-11-28: qty 1

## 2023-11-28 MED ORDER — DOCUSATE SODIUM 100 MG PO CAPS
100.0000 mg | ORAL_CAPSULE | Freq: Two times a day (BID) | ORAL | Status: DC
  Administered 2023-11-28 – 2023-11-29 (×2): 100 mg via ORAL
  Filled 2023-11-28 (×2): qty 1

## 2023-11-28 MED ORDER — FENTANYL CITRATE (PF) 100 MCG/2ML IJ SOLN
INTRAMUSCULAR | Status: AC
  Filled 2023-11-28: qty 2

## 2023-11-28 MED ORDER — HYDROMORPHONE HCL 1 MG/ML IJ SOLN: 0.2500 mg | INTRAMUSCULAR | Status: DC | PRN

## 2023-11-28 MED ORDER — ORAL CARE MOUTH RINSE: 15.0000 mL | Freq: Once | OROMUCOSAL | Status: AC

## 2023-11-28 MED ORDER — FENTANYL CITRATE PF 50 MCG/ML IJ SOSY: 25.0000 ug | PREFILLED_SYRINGE | INTRAMUSCULAR | Status: DC | PRN

## 2023-11-28 MED ORDER — DIPHENHYDRAMINE HCL 50 MG/ML IJ SOLN
INTRAMUSCULAR | Status: AC
  Filled 2023-11-28: qty 1

## 2023-11-28 MED ORDER — ONDANSETRON HCL 4 MG PO TABS: 4.0000 mg | ORAL_TABLET | Freq: Four times a day (QID) | ORAL | Status: DC | PRN

## 2023-11-28 MED ORDER — ACETAMINOPHEN 500 MG PO TABS
1000.0000 mg | ORAL_TABLET | Freq: Once | ORAL | Status: AC
  Administered 2023-11-28: 1000 mg via ORAL
  Filled 2023-11-28: qty 2

## 2023-11-28 MED ORDER — HYDROCODONE-ACETAMINOPHEN 5-325 MG PO TABS: 1.0000 | ORAL_TABLET | ORAL | Status: DC | PRN

## 2023-11-28 MED ORDER — LIDOCAINE HCL (PF) 2 % IJ SOLN
INTRAMUSCULAR | Status: AC
  Filled 2023-11-28: qty 5

## 2023-11-28 MED ORDER — EPHEDRINE 5 MG/ML INJ
INTRAVENOUS | Status: AC
  Filled 2023-11-28: qty 5

## 2023-11-28 MED ORDER — KETOROLAC TROMETHAMINE 15 MG/ML IJ SOLN
15.0000 mg | Freq: Four times a day (QID) | INTRAMUSCULAR | Status: AC
  Administered 2023-11-28 – 2023-11-29 (×4): 15 mg via INTRAVENOUS
  Filled 2023-11-28 (×4): qty 1

## 2023-11-28 MED ORDER — OXYCODONE HCL 5 MG/5ML PO SOLN: 5.0000 mg | Freq: Once | ORAL | Status: DC | PRN

## 2023-11-28 MED ORDER — AMISULPRIDE (ANTIEMETIC) 5 MG/2ML IV SOLN: 10.0000 mg | Freq: Once | INTRAVENOUS | Status: DC | PRN

## 2023-11-28 MED ORDER — BISACODYL 10 MG RE SUPP: 10.0000 mg | Freq: Every day | RECTAL | Status: DC | PRN

## 2023-11-28 MED ORDER — HYDROMORPHONE HCL 1 MG/ML IJ SOLN
INTRAMUSCULAR | Status: AC
  Filled 2023-11-28: qty 2

## 2023-11-28 MED ORDER — HYDROCODONE-ACETAMINOPHEN 7.5-325 MG PO TABS
1.0000 | ORAL_TABLET | ORAL | Status: DC | PRN
  Administered 2023-11-29: 2 via ORAL
  Filled 2023-11-28: qty 2

## 2023-11-28 MED ORDER — SENNA 8.6 MG PO TABS
1.0000 | ORAL_TABLET | Freq: Two times a day (BID) | ORAL | Status: DC
  Administered 2023-11-28 – 2023-11-29 (×2): 8.6 mg via ORAL
  Filled 2023-11-28 (×2): qty 1

## 2023-11-28 MED ORDER — ACETAMINOPHEN 500 MG PO TABS
500.0000 mg | ORAL_TABLET | Freq: Four times a day (QID) | ORAL | Status: AC
  Administered 2023-11-28 – 2023-11-29 (×4): 500 mg via ORAL
  Filled 2023-11-28 (×4): qty 1

## 2023-11-28 MED ORDER — HYDROMORPHONE HCL 2 MG/ML IJ SOLN
INTRAMUSCULAR | Status: AC
  Filled 2023-11-28: qty 1

## 2023-11-28 MED ORDER — PHENYLEPHRINE HCL-NACL 20-0.9 MG/250ML-% IV SOLN
INTRAVENOUS | Status: DC | PRN
  Administered 2023-11-28: 10 ug/min via INTRAVENOUS

## 2023-11-28 MED ORDER — ONDANSETRON HCL 4 MG/2ML IJ SOLN: 4.0000 mg | Freq: Four times a day (QID) | INTRAMUSCULAR | Status: DC | PRN

## 2023-11-28 MED ORDER — ONDANSETRON HCL 4 MG/2ML IJ SOLN
INTRAMUSCULAR | Status: AC
  Filled 2023-11-28: qty 2

## 2023-11-28 MED ORDER — METOCLOPRAMIDE HCL 5 MG/ML IJ SOLN: 5.0000 mg | Freq: Three times a day (TID) | INTRAMUSCULAR | Status: DC | PRN

## 2023-11-28 MED ORDER — ROCURONIUM BROMIDE 10 MG/ML (PF) SYRINGE
PREFILLED_SYRINGE | INTRAVENOUS | Status: DC | PRN
  Administered 2023-11-28: 30 mg via INTRAVENOUS
  Administered 2023-11-28: 20 mg via INTRAVENOUS
  Administered 2023-11-28: 50 mg via INTRAVENOUS

## 2023-11-28 MED ORDER — METHOCARBAMOL 1000 MG/10ML IJ SOLN: 500.0000 mg | Freq: Four times a day (QID) | INTRAMUSCULAR | Status: DC | PRN

## 2023-11-28 MED ORDER — LACTATED RINGERS IV SOLN: INTRAVENOUS | Status: DC

## 2023-11-28 MED ORDER — MENTHOL 3 MG MT LOZG: 1.0000 | LOZENGE | OROMUCOSAL | Status: DC | PRN

## 2023-11-28 MED ORDER — MORPHINE SULFATE (PF) 2 MG/ML IV SOLN: 0.5000 mg | INTRAVENOUS | Status: DC | PRN

## 2023-11-28 MED ORDER — POVIDONE-IODINE 10 % EX SWAB: 2.0000 | Freq: Once | CUTANEOUS | Status: DC

## 2023-11-28 MED ORDER — PHENYLEPHRINE HCL (PRESSORS) 10 MG/ML IV SOLN
INTRAVENOUS | Status: DC | PRN
  Administered 2023-11-28 (×4): 40 ug via INTRAVENOUS

## 2023-11-28 MED ORDER — HYDROMORPHONE HCL 1 MG/ML IJ SOLN
INTRAMUSCULAR | Status: DC | PRN
  Administered 2023-11-28 (×2): 1 mg via INTRAVENOUS

## 2023-11-28 MED ORDER — TRANEXAMIC ACID-NACL 1000-0.7 MG/100ML-% IV SOLN
1000.0000 mg | INTRAVENOUS | Status: AC
  Administered 2023-11-28: 1000 mg via INTRAVENOUS
  Filled 2023-11-28: qty 100

## 2023-11-28 MED ORDER — POLYETHYLENE GLYCOL 3350 17 G PO PACK: 17.0000 g | PACK | Freq: Every day | ORAL | Status: DC | PRN

## 2023-11-28 MED ORDER — SODIUM CHLORIDE 0.9 % IR SOLN
Status: DC | PRN
  Administered 2023-11-28: 250 mL
  Administered 2023-11-28: 1000 mL

## 2023-11-28 MED ORDER — ACETAMINOPHEN 325 MG PO TABS: 325.0000 mg | ORAL_TABLET | Freq: Four times a day (QID) | ORAL | Status: DC | PRN

## 2023-11-28 MED ORDER — METOCLOPRAMIDE HCL 5 MG PO TABS: 5.0000 mg | ORAL_TABLET | Freq: Three times a day (TID) | ORAL | Status: DC | PRN

## 2023-11-28 MED ORDER — PROPOFOL 500 MG/50ML IV EMUL
INTRAVENOUS | Status: DC | PRN
Start: 2023-11-28 — End: 2023-11-28
  Administered 2023-11-28: 100 mg via INTRAVENOUS
  Administered 2023-11-28: 30 mg via INTRAVENOUS
  Administered 2023-11-28: 40 mg via INTRAVENOUS

## 2023-11-28 MED ORDER — DIPHENHYDRAMINE HCL 12.5 MG/5ML PO ELIX: 12.5000 mg | ORAL_SOLUTION | ORAL | Status: DC | PRN

## 2023-11-28 MED ORDER — BUPIVACAINE-EPINEPHRINE (PF) 0.25% -1:200000 IJ SOLN
INTRAMUSCULAR | Status: AC
  Filled 2023-11-28: qty 30

## 2023-11-28 MED ORDER — WATER FOR IRRIGATION, STERILE IR SOLN
Status: DC | PRN
  Administered 2023-11-28: 2000 mL

## 2023-11-28 MED ORDER — DILTIAZEM HCL ER COATED BEADS 120 MG PO CP24
120.0000 mg | ORAL_CAPSULE | Freq: Every day | ORAL | Status: DC
  Administered 2023-11-29: 120 mg via ORAL
  Filled 2023-11-28: qty 1

## 2023-11-28 MED ORDER — PANTOPRAZOLE SODIUM 40 MG PO TBEC
40.0000 mg | DELAYED_RELEASE_TABLET | Freq: Every day | ORAL | Status: DC
  Administered 2023-11-28 – 2023-11-29 (×2): 40 mg via ORAL
  Filled 2023-11-28 (×2): qty 1

## 2023-11-28 MED ORDER — KETAMINE HCL 50 MG/5ML IJ SOSY
PREFILLED_SYRINGE | INTRAMUSCULAR | Status: AC
  Filled 2023-11-28: qty 5

## 2023-11-28 MED ORDER — KETOROLAC TROMETHAMINE 30 MG/ML IJ SOLN
INTRAMUSCULAR | Status: AC
  Filled 2023-11-28: qty 1

## 2023-11-28 MED ORDER — PHENOL 1.4 % MT LIQD: 1.0000 | OROMUCOSAL | Status: DC | PRN

## 2023-11-28 MED ORDER — DIPHENHYDRAMINE HCL 50 MG/ML IJ SOLN
INTRAMUSCULAR | Status: DC | PRN
  Administered 2023-11-28: 6.75 mg via INTRAVENOUS

## 2023-11-28 MED ORDER — CEFAZOLIN SODIUM-DEXTROSE 2-4 GM/100ML-% IV SOLN
2.0000 g | INTRAVENOUS | Status: AC
  Administered 2023-11-28: 2 g via INTRAVENOUS
  Filled 2023-11-28: qty 100

## 2023-11-28 MED ORDER — ROCURONIUM BROMIDE 10 MG/ML (PF) SYRINGE
PREFILLED_SYRINGE | INTRAVENOUS | Status: AC
  Filled 2023-11-28: qty 10

## 2023-11-28 MED ORDER — DEXAMETHASONE SODIUM PHOSPHATE 10 MG/ML IJ SOLN
INTRAMUSCULAR | Status: AC
  Filled 2023-11-28: qty 1

## 2023-11-28 MED ORDER — SODIUM CHLORIDE (PF) 0.9 % IJ SOLN
INTRAMUSCULAR | Status: DC | PRN
  Administered 2023-11-28: 61 mL

## 2023-11-28 MED ORDER — TRIAMCINOLONE ACETONIDE 40 MG/ML IJ SUSP
INTRAMUSCULAR | Status: AC
Start: 2023-11-28 — End: 2023-11-28
  Filled 2023-11-28: qty 1

## 2023-11-28 SURGICAL SUPPLY — 48 items
BAG COUNTER SPONGE SURGICOUNT (BAG) IMPLANT
BAG ZIPLOCK 12X15 (MISCELLANEOUS) IMPLANT
BLADE SAW RECIPROCATING 77.5 (BLADE) ×1 IMPLANT
CHLORAPREP W/TINT 26 (MISCELLANEOUS) ×1 IMPLANT
COVER PERINEAL POST (MISCELLANEOUS) ×1 IMPLANT
COVER SURGICAL LIGHT HANDLE (MISCELLANEOUS) ×1 IMPLANT
DERMABOND ADVANCED .7 DNX12 (GAUZE/BANDAGES/DRESSINGS) ×2 IMPLANT
DRAPE IMP U-DRAPE 54X76 (DRAPES) ×1 IMPLANT
DRAPE SHEET LG 3/4 BI-LAMINATE (DRAPES) ×3 IMPLANT
DRAPE STERI IOBAN 125X83 (DRAPES) ×1 IMPLANT
DRAPE U-SHAPE 47X51 STRL (DRAPES) ×2 IMPLANT
DRSG AQUACEL AG ADV 3.5X10 (GAUZE/BANDAGES/DRESSINGS) ×1 IMPLANT
ELECT REM PT RETURN 15FT ADLT (MISCELLANEOUS) ×1 IMPLANT
GAUZE SPONGE 4X4 12PLY STRL (GAUZE/BANDAGES/DRESSINGS) ×1 IMPLANT
GLOVE BIO SURGEON STRL SZ7 (GLOVE) ×1 IMPLANT
GLOVE BIO SURGEON STRL SZ8.5 (GLOVE) ×2 IMPLANT
GLOVE BIOGEL PI IND STRL 7.5 (GLOVE) ×1 IMPLANT
GLOVE BIOGEL PI IND STRL 8.5 (GLOVE) ×1 IMPLANT
GOWN SPEC L3 XXLG W/TWL (GOWN DISPOSABLE) ×1 IMPLANT
GOWN STRL REUS W/ TWL XL LVL3 (GOWN DISPOSABLE) ×1 IMPLANT
HEAD CERAMIC BIOLOX 36MM (Head) IMPLANT
HOLDER FOLEY CATH W/STRAP (MISCELLANEOUS) ×1 IMPLANT
HOOD PEEL AWAY T7 (MISCELLANEOUS) ×3 IMPLANT
KIT TURNOVER KIT A (KITS) ×1 IMPLANT
LINER ACE G7 HIGH 36 SZ E (Liner) IMPLANT
MANIFOLD NEPTUNE II (INSTRUMENTS) ×1 IMPLANT
MARKER SKIN DUAL TIP RULER LAB (MISCELLANEOUS) ×1 IMPLANT
NDL SAFETY ECLIPSE 18X1.5 (NEEDLE) ×1 IMPLANT
NDL SPNL 18GX3.5 QUINCKE PK (NEEDLE) ×1 IMPLANT
NEEDLE SPNL 18GX3.5 QUINCKE PK (NEEDLE) ×1 IMPLANT
PACK ANTERIOR HIP CUSTOM (KITS) ×1 IMPLANT
PENCIL SMOKE EVACUATOR (MISCELLANEOUS) ×1 IMPLANT
SEALER BIPOLAR AQUA 6.0 (INSTRUMENTS) ×1 IMPLANT
SET HNDPC FAN SPRY TIP SCT (DISPOSABLE) ×1 IMPLANT
SHELL ACETAB 3H 52 E HIP (Shell) IMPLANT
SLEEVE HIP BIOLOX -6MM OFFSET (Sleeve) IMPLANT
SOLUTION PRONTOSAN WOUND 350ML (IRRIGATION / IRRIGATOR) ×1 IMPLANT
SPIKE FLUID TRANSFER (MISCELLANEOUS) ×1 IMPLANT
STEM FEMORAL TAPERLOC 13X111 (Stem) IMPLANT
SUT MNCRL AB 3-0 PS2 18 (SUTURE) ×1 IMPLANT
SUT MON AB 2-0 CT1 36 (SUTURE) ×1 IMPLANT
SUT STRATAFIX 14 PDO 48 VLT (SUTURE) ×1 IMPLANT
SUT VIC AB 2-0 CT1 TAPERPNT 27 (SUTURE) IMPLANT
SYR 3ML LL SCALE MARK (SYRINGE) ×1 IMPLANT
TOWEL GREEN STERILE FF (TOWEL DISPOSABLE) ×1 IMPLANT
TRAY FOLEY MTR SLVR 16FR STAT (SET/KITS/TRAYS/PACK) IMPLANT
TUBE SUCTION HIGH CAP CLEAR NV (SUCTIONS) ×1 IMPLANT
WATER STERILE IRR 1000ML POUR (IV SOLUTION) ×1 IMPLANT

## 2023-11-28 NOTE — Anesthesia Procedure Notes (Signed)
 Procedure Name: Intubation Date/Time: 11/28/2023 7:50 AM  Performed by: Rhodia Debby MATSU, CRNAPre-anesthesia Checklist: Patient identified, Emergency Drugs available, Suction available, Patient being monitored and Timeout performed Patient Re-evaluated:Patient Re-evaluated prior to induction Oxygen Delivery Method: Circle system utilized Preoxygenation: Pre-oxygenation with 100% oxygen Induction Type: IV induction Ventilation: Mask ventilation without difficulty Laryngoscope Size: Miller and 2 Grade View: Grade I Tube type: Oral Tube size: 7.0 mm Number of attempts: 1 Airway Equipment and Method: Stylet Placement Confirmation: ETT inserted through vocal cords under direct vision, positive ETCO2 and breath sounds checked- equal and bilateral Secured at: 20 (vc between black lines of ETT, lubricated cuff easily inserted) cm Tube secured with: Tape Dental Injury: Teeth and Oropharynx as per pre-operative assessment  Comments: Brief atraumatic dentition unchanged

## 2023-11-28 NOTE — Plan of Care (Signed)

## 2023-11-28 NOTE — Anesthesia Postprocedure Evaluation (Signed)
 Anesthesia Post Note  Patient: [name] [name]  Procedure(s) Performed: ARTHROPLASTY, HIP, TOTAL, ANTERIOR APPROACH (Right: Hip)     Patient location during evaluation: PACU Anesthesia Type: General Level of consciousness: awake and alert Pain management: pain level controlled Vital Signs Assessment: post-procedure vital signs reviewed and stable Respiratory status: spontaneous breathing, nonlabored ventilation, respiratory function stable and patient connected to nasal cannula oxygen Cardiovascular status: blood pressure returned to baseline and stable Postop Assessment: no apparent nausea or vomiting Anesthetic complications: no   No notable events documented.  Last Vitals:  Vitals:   11/28/23 1225 11/28/23 1233  BP: (!) 152/80 (!) 152/80  Pulse: 79 79  Resp: 12 12  Temp: 36.7 C 36.7 C  SpO2: 95%     Last Pain:  Vitals:   11/28/23 1233  TempSrc: Axillary  PainSc:                  Connor Meacham L Eltha Tingley

## 2023-11-28 NOTE — Transfer of Care (Signed)
 Immediate Anesthesia Transfer of Care Note  Patient: [name] [name]  Procedure(s) Performed: ARTHROPLASTY, HIP, TOTAL, ANTERIOR APPROACH (Right: Hip)  Patient Location: PACU  Anesthesia Type:General  Level of Consciousness: drowsy  Airway & Oxygen Therapy: Patient Spontanous Breathing and Patient connected to face mask oxygen  Post-op Assessment: Report given to RN and Post -op Vital signs reviewed and stable  Post vital signs: Reviewed and stable  Last Vitals:  Vitals Value Taken Time  BP 144/60 11/28/23 11:03  Temp    Pulse 62 11/28/23 11:06  Resp 14 11/28/23 11:06  SpO2 99 % 11/28/23 11:06  Vitals shown include unfiled device data.  Last Pain:  Vitals:   11/28/23 0536  TempSrc: Oral         Complications: No notable events documented.

## 2023-11-28 NOTE — Op Note (Signed)
 OPERATIVE REPORT  SURGEON: Redell Shoals, MD   ASSISTANT: Valery Potters, PA-C.  PREOPERATIVE DIAGNOSIS: Right hip arthritis.   POSTOPERATIVE DIAGNOSIS: Right hip arthritis.   PROCEDURE: Right total hip arthroplasty, anterior approach.   IMPLANTS: Biomet Taperloc Complete Microplasty stem, size 13 x 111 mm, high offset. Biomet G7 OsseoTi Cup, size 52 mm. Biomet Vivacit-E liner, size 36 mm, E, neutral. Biomet Biolox ceramic head ball, size 36 - 6 mm.  ANESTHESIA:  General  ESTIMATED BLOOD LOSS:-300 mL    ANTIBIOTICS: 2g Ancef .  DRAINS: None.  COMPLICATIONS: None.   CONDITION: PACU - hemodynamically stable.   BRIEF CLINICAL NOTE: [name] [name] is a 63 y.o. female with a long-standing history of Right hip arthritis. After failing conservative management, the patient was indicated for total hip arthroplasty. The risks, benefits, and alternatives to the procedure were explained, and the patient elected to proceed.  PROCEDURE IN DETAIL: Surgical site was marked by myself in the pre-op holding area. Once inside the operating room, spinal anesthesia was obtained, and a foley catheter was inserted. The patient was then positioned on the Hana table.  All bony prominences were well padded.  The hip was prepped and draped in the normal sterile surgical fashion.  A time-out was called verifying side and site of surgery. The patient received IV antibiotics within 60 minutes of beginning the procedure.   Bikini incision was made, and superficial dissection was performed lateral to the ASIS. The direct anterior approach to the hip was performed through the Hueter interval.  Lateral femoral circumflex vessels were treated with the Auqumantys. The anterior capsule was exposed and an inverted T capsulotomy was made. The femoral neck cut was made to the level of the templated cut.  A corkscrew was placed into the head and the head was removed.  The femoral head was found to have eburnated bone.  The head was passed to the back table and was measured. Pubofemoral ligament was released off of the calcar, taking care to stay on bone. Superior capsule was released from the greater trochanter, taking care to stay lateral to the posterior border of the femoral neck in order to preserve the short external rotators.   Acetabular exposure was achieved, and the pulvinar and labrum were excised. Sequential reaming of the acetabulum was then performed up to a size 51 mm reamer. A 52 mm cup was then opened and impacted into place at approximately 40 degrees of abduction and 20 degrees of anteversion. The final polyethylene liner was impacted into place and acetabular osteophytes were removed.    I then gained femoral exposure taking care to protect the abductors and greater trochanter.  This was performed using standard external rotation, extension, and adduction.  A cookie cutter was used to enter the femoral canal, and then the femoral canal finder was placed.  Sequential broaching was performed up to a size 13.  Calcar planer was used on the femoral neck remnant.  I placed a high offset neck and a trial head ball.  The hip was reduced.  Leg lengths and offset were checked fluoroscopically.  The hip was dislocated and trial components were removed.  The final implants were placed, and the hip was reduced.  Fluoroscopy was used to confirm component position and leg lengths.  At 90 degrees of external rotation and full extension, the hip was stable to an anterior directed force.   The wound was copiously irrigated with Prontosan solution and normal saline using pule lavage.  Marcaine  solution  was injected into the periarticular soft tissue.  The wound was closed in layers using #1 Vicryl and V-Loc for the fascia, 2-0 Vicryl for the subcutaneous fat, 2-0 Monocryl for the deep dermal layer, 3-0 running Monocryl subcuticular stitch, and Dermabond for the skin.  Once the glue was fully dried, an Aquacell Ag dressing  was applied.  The patient was transported to the recovery room in stable condition.  Sponge, needle, and instrument counts were correct at the end of the case x2.  The patient tolerated the procedure well and there were no known complications.  Please note that a surgical assistant was a medical necessity for this procedure to perform it in a safe and expeditious manner. Assistant was necessary to provide appropriate retraction of vital neurovascular structures, to prevent femoral fracture, and to allow for anatomic placement of the prosthesis.

## 2023-11-28 NOTE — Discharge Instructions (Signed)

## 2023-11-28 NOTE — Interval H&P Note (Signed)
 History and Physical Interval Note:  11/28/2023 7:21 AM  [name] [name]  has presented today for surgery, with the diagnosis of Right hip osteoarthritis.  The various methods of treatment have been discussed with the patient and family. After consideration of risks, benefits and other options for treatment, the patient has consented to  Procedure(s): ARTHROPLASTY, HIP, TOTAL, ANTERIOR APPROACH (Right) as a surgical intervention.  The patient's history has been reviewed, patient examined, no change in status, stable for surgery.  I have reviewed the patient's chart and labs.  Questions were answered to the patient's satisfaction.     Redell PARAS Atley Neubert

## 2023-11-28 NOTE — Evaluation (Signed)
 Physical Therapy Evaluation Patient Details Name: [name] [name] MRN: [id number] DOB: 02-29-1961 Today's Date: 11/28/2023  History of Present Illness  63 y.o. female admitted 11/28/23 for R THA-AA. PMH: B TKA, back surgery, HTN, DDD-cervical, vestibular migraine.  Clinical Impression  Pt is s/p THA resulting in the deficits listed below (see PT Problem List). Pt ambulated 40' with RW, no loss of balance, distance limited by pain/fatigue. Good progress expected. Pt reports she ambulates with a cane at baseline and that her sister will provide 24/7 assistance at home for 2 weeks.  Pt will benefit from acute skilled PT to increase their independence and safety with mobility to facilitate discharge.          If plan is discharge home, recommend the following: A little help with walking and/or transfers;A little help with bathing/dressing/bathroom;Assistance with cooking/housework;Assist for transportation;Help with stairs or ramp for entrance   Can travel by private vehicle        Equipment Recommendations None recommended by PT  Recommendations for Other Services       Functional Status Assessment Patient has had a recent decline in their functional status and demonstrates the ability to make significant improvements in function in a reasonable and predictable amount of time.     Precautions / Restrictions Precautions Precautions: Fall Recall of Precautions/Restrictions: Intact Precaution/Restrictions Comments: reports 1 fall in past 6 months Restrictions Weight Bearing Restrictions Per Provider Order: Yes RLE Weight Bearing Per Provider Order: Weight bearing as tolerated      Mobility  Bed Mobility Overal bed mobility: Needs Assistance Bed Mobility: Supine to Sit     Supine to sit: Min assist     General bed mobility comments: assist to raise trunk    Transfers Overall transfer level: Needs assistance Equipment used: Rolling walker (2 wheels) Transfers: Sit to/from  Stand Sit to Stand: Min assist           General transfer comment: VCs hand placement, min A to power up/steady    Ambulation/Gait Ambulation/Gait assistance: Contact guard assist Gait Distance (Feet): 40 Feet Assistive device: Rolling walker (2 wheels) Gait Pattern/deviations: Step-to pattern, Decreased step length - left, Decreased step length - right Gait velocity: decr     General Gait Details: VCs sequencing, distance limited by fatigue, no loss of balance  Stairs            Wheelchair Mobility     Tilt Bed    Modified Rankin (Stroke Patients Only)       Balance Overall balance assessment: Modified Independent, History of Falls                                           Pertinent Vitals/Pain Pain Assessment Pain Assessment: 0-10 Pain Score: 3  Pain Location: R hip Pain Descriptors / Indicators: Sore Pain Intervention(s): Limited activity within patient's tolerance, Monitored during session, Premedicated before session, Ice applied    Home Living Family/patient expects to be discharged to:: Private residence Living Arrangements: Alone Available Help at Discharge: Family;Available 24 hours/day Type of Home: House Home Access: Stairs to enter   [employer] of Steps: 1   Home Layout: One level Home Equipment: Agricultural consultant (2 wheels);Grab bars - tub/shower;Shower seat Additional Comments: sister to stay with pt for 2 weeks    Prior Function Prior Level of Function : Independent/Modified Independent  Mobility Comments: walks with SPC, 1 fall in past 6 months ADLs Comments: independent     Extremity/Trunk Assessment   Upper Extremity Assessment Upper Extremity Assessment: Overall WFL for tasks assessed    Lower Extremity Assessment Lower Extremity Assessment: RLE deficits/detail RLE Deficits / Details: hip +2/5, knee ext at least 3/5 RLE Sensation: WNL RLE Coordination: WNL    Cervical /  Trunk Assessment Cervical / Trunk Assessment: Normal  Communication   Communication Communication: No apparent difficulties    Cognition Arousal: Alert Behavior During Therapy: WFL for tasks assessed/performed   PT - Cognitive impairments: No family/caregiver present to determine baseline                         Following commands: Intact       Cueing Cueing Techniques: Verbal cues     General Comments      Exercises Total Joint Exercises Ankle Circles/Pumps: AROM, Both, 10 reps, Supine   Assessment/Plan    PT Assessment Patient needs continued PT services  PT Problem List Decreased range of motion;Decreased activity tolerance;Decreased mobility;Pain       PT Treatment Interventions Gait training;Therapeutic activities;Stair training;Patient/family education    PT Goals (Current goals can be found in the Care Plan section)  Acute Rehab PT Goals Patient Stated Goal: to walk without pain PT Goal Formulation: With patient Time For Goal Achievement: 12/05/23 Potential to Achieve Goals: Good    Frequency 7X/week     Co-evaluation               AM-PAC PT 6 Clicks Mobility  Outcome Measure Help needed turning from your back to your side while in a flat bed without using bedrails?: A Little Help needed moving from lying on your back to sitting on the side of a flat bed without using bedrails?: A Little Help needed moving to and from a bed to a chair (including a wheelchair)?: A Little Help needed standing up from a chair using your arms (e.g., wheelchair or bedside chair)?: A Little Help needed to walk in hospital room?: A Little Help needed climbing 3-5 steps with a railing? : A Lot 6 Click Score: 17    End of Session Equipment Utilized During Treatment: Gait belt Activity Tolerance: Patient tolerated treatment well Patient left: in chair;with call bell/phone within reach;with chair alarm set Nurse Communication: Mobility status PT Visit  Diagnosis: Difficulty in walking, not elsewhere classified (R26.2);Pain Pain - Right/Left: Right Pain - part of body: Hip    Time: 8588-8574 PT Time Calculation (min) (ACUTE ONLY): 14 min   Charges:   PT Evaluation $PT Eval Moderate Complexity: 1 Mod   PT General Charges $$ ACUTE PT VISIT: 1 Visit        Sylvan Delon Copp PT 11/28/2023  Acute Rehabilitation Services  Office [phone]

## 2023-11-29 ENCOUNTER — Encounter (HOSPITAL_COMMUNITY): Payer: Self-pay | Admitting: Orthopedic Surgery

## 2023-11-29 DIAGNOSIS — M1611 Unilateral primary osteoarthritis, right hip: Secondary | ICD-10-CM | POA: Diagnosis not present

## 2023-11-29 LAB — CBC
HCT: 36 % (ref 36.0–46.0)
Hemoglobin: 11.8 g/dL — ABNORMAL LOW (ref 12.0–15.0)
MCH: 30.5 pg (ref 26.0–34.0)
MCHC: 32.8 g/dL (ref 30.0–36.0)
MCV: 93 fL (ref 80.0–100.0)
Platelets: 276 K/uL (ref 150–400)
RBC: 3.87 MIL/uL (ref 3.87–5.11)
RDW: 12.1 % (ref 11.5–15.5)
WBC: 18.8 K/uL — ABNORMAL HIGH (ref 4.0–10.5)
nRBC: 0 % (ref 0.0–0.2)

## 2023-11-29 LAB — BASIC METABOLIC PANEL WITH GFR
Anion gap: 5 (ref 5–15)
BUN: 18 mg/dL (ref 8–23)
CO2: 23 mmol/L (ref 22–32)
Calcium: 8.3 mg/dL — ABNORMAL LOW (ref 8.9–10.3)
Chloride: 107 mmol/L (ref 98–111)
Creatinine, Ser: 0.76 mg/dL (ref 0.44–1.00)
GFR, Estimated: >60 mL/min (ref >60)
Glucose, Bld: 156 mg/dL — ABNORMAL HIGH (ref 70–99)
Potassium: 3.6 mmol/L (ref 3.5–5.1)
Sodium: 135 mmol/L (ref 135–145)

## 2023-11-29 MED ORDER — METHOCARBAMOL 500 MG PO TABS: 500.0000 mg | ORAL_TABLET | Freq: Four times a day (QID) | ORAL | 0 refills | Status: DC | PRN

## 2023-11-29 MED ORDER — SENNA 8.6 MG PO TABS: 2.0000 | ORAL_TABLET | Freq: Every day | ORAL | 0 refills | Status: AC

## 2023-11-29 MED ORDER — POLYETHYLENE GLYCOL 3350 17 G PO PACK: 17.0000 g | PACK | Freq: Every day | ORAL | 0 refills | Status: AC | PRN

## 2023-11-29 MED ORDER — ONDANSETRON HCL 4 MG PO TABS: 4.0000 mg | ORAL_TABLET | Freq: Three times a day (TID) | ORAL | 0 refills | Status: DC | PRN

## 2023-11-29 MED ORDER — DOCUSATE SODIUM 100 MG PO CAPS: 100.0000 mg | ORAL_CAPSULE | Freq: Two times a day (BID) | ORAL | 0 refills | Status: AC

## 2023-11-29 MED ORDER — ASPIRIN 81 MG PO CHEW: 81.0000 mg | CHEWABLE_TABLET | Freq: Two times a day (BID) | ORAL | 0 refills | Status: AC

## 2023-11-29 MED ORDER — HYDROCODONE-ACETAMINOPHEN 5-325 MG PO TABS: 1.0000 | ORAL_TABLET | ORAL | 0 refills | Status: AC | PRN

## 2023-11-29 NOTE — Plan of Care (Signed)
  Problem: Education: Goal: Knowledge of General Education information will improve Description: Including pain rating scale, medication(s)/side effects and non-pharmacologic comfort measures Outcome: Adequate for Discharge   Problem: Health Behavior/Discharge Planning: Goal: Ability to manage health-related needs will improve Outcome: Adequate for Discharge   Problem: Clinical Measurements: Goal: Ability to maintain clinical measurements within normal limits will improve Outcome: Adequate for Discharge Goal: Will remain free from infection Outcome: Adequate for Discharge Goal: Diagnostic test results will improve Outcome: Adequate for Discharge Goal: Respiratory complications will improve Outcome: Adequate for Discharge Goal: Cardiovascular complication will be avoided Outcome: Adequate for Discharge   Problem: Activity: Goal: Risk for activity intolerance will decrease Outcome: Adequate for Discharge   Problem: Nutrition: Goal: Adequate nutrition will be maintained Outcome: Adequate for Discharge   Problem: Coping: Goal: Level of anxiety will decrease Outcome: Adequate for Discharge   Problem: Elimination: Goal: Will not experience complications related to bowel motility Outcome: Adequate for Discharge Goal: Will not experience complications related to urinary retention Outcome: Adequate for Discharge   Problem: Pain Managment: Goal: General experience of comfort will improve and/or be controlled Outcome: Adequate for Discharge   Problem: Safety: Goal: Ability to remain free from injury will improve Outcome: Adequate for Discharge   Problem: Skin Integrity: Goal: Risk for impaired skin integrity will decrease Outcome: Adequate for Discharge   Problem: Education: Goal: Knowledge of the prescribed therapeutic regimen will improve Outcome: Adequate for Discharge Goal: Understanding of discharge needs will improve Outcome: Adequate for Discharge Goal:  Individualized Educational Video(s) Outcome: Adequate for Discharge   Problem: Activity: Goal: Ability to avoid complications of mobility impairment will improve Outcome: Adequate for Discharge Goal: Ability to tolerate increased activity will improve Outcome: Adequate for Discharge   Problem: Clinical Measurements: Goal: Postoperative complications will be avoided or minimized Outcome: Adequate for Discharge   Problem: Pain Management: Goal: Pain level will decrease with appropriate interventions Outcome: Adequate for Discharge   Problem: Skin Integrity: Goal: Will show signs of wound healing Outcome: Adequate for Discharge

## 2023-11-29 NOTE — Discharge Summary (Signed)
 Physician Discharge Summary  Patient ID: [name] [name] MRN: [id number] DOB/AGE: [birth year] 63 y.o.  Admit date: 11/28/2023 Discharge date: 11/29/2023  Admission Diagnoses:  Osteoarthritis of right hip  Discharge Diagnoses:  Principal Problem:   Osteoarthritis of right hip Active Problems:   S/P total right hip arthroplasty   Past Medical History:  Diagnosis Date   Anxiety 2008   seen in ED /w chest pain but told that she was having anxiety   Arthritis    Colon polyps    History of kidney stones 1999   History of migraine    History of stress test 2010   done due to chest pain /w Dr. Ladona. Pt. told that it was wnl.   Hives of unknown origin    told by PCP [facility] [place]) to use Zrytec BID to control hives    Hypertension    Kidney stones    Neuromuscular disorder (HCC)    back pain   Obesity, unspecified    Sleep difficulties    has had a sleep study - t old that she didn't meet criteria for machine    Syncope    due to pain    Surgeries: Procedure(s): ARTHROPLASTY, HIP, TOTAL, ANTERIOR APPROACH on 11/28/2023   Consultants (if any):   Discharged Condition: Improved  Hospital Course: [name] [name] is an 63 y.o. female who was admitted 11/28/2023 with a diagnosis of Osteoarthritis of right hip and went to the operating room on 11/28/2023 and underwent the above named procedures.    She was given perioperative antibiotics:  Anti-infectives (From admission, onward)    Start     Dose/Rate Route Frequency Ordered Stop   11/28/23 1330  ceFAZolin  (ANCEF ) IVPB 2g/100 mL premix        2 g 200 mL/hr over 30 Minutes Intravenous Every 6 hours 11/28/23 1226 11/28/23 1957   11/28/23 0600  ceFAZolin  (ANCEF ) IVPB 2g/100 mL premix        2 g 200 mL/hr over 30 Minutes Intravenous On call to O.R. 11/28/23 0541 11/28/23 0800       She was given sequential compression devices, early ambulation, and aspirin  for DVT prophylaxis.  POD#1 160 feet with PT today. D/c home with HEP.  Follow-up in office in 2 weeks.   She benefited maximally from the hospital stay and there were no complications.    Recent vital signs:  Vitals:   11/29/23 0612 11/29/23 0911  BP: (!) 159/58 (!) 140/59  Pulse: 64 74  Resp: 16 18  Temp: 97.6 F (36.4 C) 97.8 F (36.6 C)  SpO2: 97% 98%    Recent laboratory studies:  Lab Results  Component Value Date   HGB 11.8 (L) 11/29/2023   HGB 14.6 11/19/2023   HGB 13.6 09/20/2023   Lab Results  Component Value Date   WBC 18.8 (H) 11/29/2023   PLT 276 11/29/2023   Lab Results  Component Value Date   INR 1.0 09/10/2019   Lab Results  Component Value Date   NA 135 11/29/2023   K 3.6 11/29/2023   CL 107 11/29/2023   CO2 23 11/29/2023   BUN 18 11/29/2023   CREATININE 0.76 11/29/2023   GLUCOSE 156 (H) 11/29/2023     Allergies as of 11/29/2023       Reactions   American Cockroach Other (See Comments)   unknown   Gabapentin    Dizziness   Penicillins Hives, Rash   Tolerated Cephalosporin Date: 11/28/23.  Medication List     TAKE these medications    aspirin  81 MG chewable tablet Commonly known as: Aspirin  Childrens Chew 1 tablet (81 mg total) by mouth 2 (two) times daily with a meal.   atorvastatin  20 MG tablet Commonly known as: LIPITOR TAKE 1 TABLET BY MOUTH EVERY DAY   BEET ROOT PO Take 2 tablets by mouth in the morning. Super Beets   cetirizine  10 MG tablet Commonly known as: ZYRTEC  Take 2 tablets (20 mg total) by mouth 2 (two) times daily as needed for allergies (hives). What changed:  how much to take when to take this   diltiazem  120 MG 24 hr capsule Commonly known as: CARDIZEM  CD TAKE 1 CAPSULE BY MOUTH EVERY DAY   docusate sodium  100 MG capsule Commonly known as: Colace Take 1 capsule (100 mg total) by mouth 2 (two) times daily.   ferrous sulfate  325 (65 FE) MG EC tablet Take 325 mg by mouth in the morning.   HAIR SKIN NAILS PO Take by mouth.   hydrochlorothiazide  12.5 MG  capsule Commonly known as: MICROZIDE  TAKE 1 CAPSULE BY MOUTH EVERY DAY   HYDROcodone -acetaminophen  5-325 MG tablet Commonly known as: NORCO/VICODIN Take 1 tablet by mouth every 4 (four) hours as needed for up to 7 days for moderate pain (pain score 4-6) or severe pain (pain score 7-10). What changed:  when to take this reasons to take this   methocarbamol  500 MG tablet Commonly known as: ROBAXIN  Take 1 tablet (500 mg total) by mouth every 6 (six) hours as needed for muscle spasms.   ondansetron  4 MG tablet Commonly known as: Zofran  Take 1 tablet (4 mg total) by mouth every 8 (eight) hours as needed for nausea or vomiting.   polyethylene glycol 17 g packet Commonly known as: MiraLax  Take 17 g by mouth daily as needed for mild constipation or moderate constipation.   senna 8.6 MG Tabs tablet Commonly known as: SENOKOT Take 2 tablets (17.2 mg total) by mouth at bedtime for 15 days.   triamcinolone  ointment 0.5 % Commonly known as: KENALOG  Apply 1 Application topically 2 (two) times daily. What changed:  when to take this reasons to take this               Discharge Care Instructions  (From admission, onward)           Start     Ordered   11/29/23 0000  Weight bearing as tolerated        11/29/23 0850   11/29/23 0000  Change dressing       Comments: Do not change your dressing.   11/29/23 0850              WEIGHT BEARING   Weight bearing as tolerated with assist device (walker, cane, etc) as directed, use it as long as suggested by your surgeon or therapist, typically at least 4-6 weeks.   EXERCISES  Results after joint replacement surgery are often greatly improved when you follow the exercise, range of motion and muscle strengthening exercises prescribed by your doctor. Safety measures are also important to protect the joint from further injury. Any time any of these exercises cause you to have increased pain or swelling, decrease what you are doing  until you are comfortable again and then slowly increase them. If you have problems or questions, call your caregiver or physical therapist for advice.   Rehabilitation is important following a joint replacement. After just a few days of  immobilization, the muscles of the leg can become weakened and shrink (atrophy).  These exercises are designed to build up the tone and strength of the thigh and leg muscles and to improve motion. Often times heat used for twenty to thirty minutes before working out will loosen up your tissues and help with improving the range of motion but do not use heat for the first two weeks following surgery (sometimes heat can increase post-operative swelling).   These exercises can be done on a training (exercise) mat, on the floor, on a table or on a bed. Use whatever works the best and is most comfortable for you.    Use music or television while you are exercising so that the exercises are a pleasant break in your day. This will make your life better with the exercises acting as a break in your routine that you can look forward to.   Perform all exercises about fifteen times, three times per day or as directed.  You should exercise both the operative leg and the other leg as well.  Exercises include:   Quad Sets - Tighten up the muscle on the front of the thigh (Quad) and hold for 5-10 seconds.   Straight Leg Raises - With your knee straight (if you were given a brace, keep it on), lift the leg to 60 degrees, hold for 3 seconds, and slowly lower the leg.  Perform this exercise against resistance later as your leg gets stronger.  Leg Slides: Lying on your back, slowly slide your foot toward your buttocks, bending your knee up off the floor (only go as far as is comfortable). Then slowly slide your foot back down until your leg is flat on the floor again.  Angel Wings: Lying on your back spread your legs to the side as far apart as you can without causing discomfort.  Hamstring  Strength:  Lying on your back, push your heel against the floor with your leg straight by tightening up the muscles of your buttocks.  Repeat, but this time bend your knee to a comfortable angle, and push your heel against the floor.  You may put a pillow under the heel to make it more comfortable if necessary.   A rehabilitation program following joint replacement surgery can speed recovery and prevent re-injury in the future due to weakened muscles. Contact your doctor or a physical therapist for more information on knee rehabilitation.    CONSTIPATION  Constipation is defined medically as fewer than three stools per week and severe constipation as less than one stool per week.  Even if you have a regular bowel pattern at home, your normal regimen is likely to be disrupted due to multiple reasons following surgery.  Combination of anesthesia, postoperative narcotics, change in appetite and fluid intake all can affect your bowels.   YOU MUST use at least one of the following options; they are listed in order of increasing strength to get the job done.  They are all available over the counter, and you may need to use some, POSSIBLY even all of these options:    Drink plenty of fluids (prune juice may be helpful) and high fiber foods Colace 100 mg by mouth twice a day  Senokot for constipation as directed and as needed Dulcolax (bisacodyl ), take with full glass of water   Miralax  (polyethylene glycol) once or twice a day as needed.  If you have tried all these things and are unable to have a bowel movement in the  first 3-4 days after surgery call either your surgeon or your primary doctor.    If you experience loose stools or diarrhea, hold the medications until you stool forms back up.  If your symptoms do not get better within 1 week or if they get worse, check with your doctor.  If you experience the worst abdominal pain ever or develop nausea or vomiting, please contact the office immediately  for further recommendations for treatment.   ITCHING:  If you experience itching with your medications, try taking only a single pain pill, or even half a pain pill at a time.  You can also use Benadryl  over the counter for itching or also to help with sleep.   TED HOSE STOCKINGS:  Use stockings on both legs until for at least 2 weeks or as directed by physician office. They may be removed at night for sleeping.  MEDICATIONS:  See your medication summary on the "After Visit Summary" that nursing will review with you.  You may have some home medications which will be placed on hold until you complete the course of blood thinner medication.  It is important for you to complete the blood thinner medication as prescribed.  PRECAUTIONS:  If you experience chest pain or shortness of breath - call 911 immediately for transfer to the hospital emergency department.   If you develop a fever greater that 101 F, purulent drainage from wound, increased redness or drainage from wound, foul odor from the wound/dressing, or calf pain - CONTACT YOUR SURGEON.                                                   FOLLOW-UP APPOINTMENTS:  If you do not already have a post-op appointment, please call the office for an appointment to be seen by your surgeon.  Guidelines for how soon to be seen are listed in your "After Visit Summary", but are typically between 1-4 weeks after surgery.  OTHER INSTRUCTIONS:   Knee Replacement:  Do not place pillow under knee, focus on keeping the knee straight while resting. CPM instructions: 0-90 degrees, 2 hours in the morning, 2 hours in the afternoon, and 2 hours in the evening. Place foam block, curve side up under heel at all times except when in CPM or when walking.  DO NOT modify, tear, cut, or change the foam block in any way.   MAKE SURE YOU:  Understand these instructions.  Get help right away if you are not doing well or get worse.    Thank you for letting us  be a part of  your medical care team.  It is a privilege we respect greatly.  We hope these instructions will help you stay on track for a fast and full recovery!   Diagnostic Studies: DG Pelvis Portable Result Date: 11/28/2023 CLINICAL DATA:  Status post right total hip arthroplasty. EXAM: PORTABLE PELVIS 1-2 VIEWS COMPARISON:  None Available. FINDINGS: Status post right total hip arthroplasty with normal alignment. No acute fracture. Expected postoperative soft tissue swelling and soft tissue air along the right hip. IMPRESSION: Status post right total hip arthroplasty with normal alignment. Electronically Signed   By: Harrietta Sherry M.D.   On: 11/28/2023 11:47   DG HIP UNILAT WITH PELVIS 1V RIGHT Result Date: 11/28/2023 CLINICAL DATA:  [id number] Surgery, elective [id number] EXAM: DG HIP (WITH  OR WITHOUT PELVIS) 1V RIGHT COMPARISON:  None Available. FINDINGS: Multiple intraoperative fluoroscopic spot images are provided. Interval left total hip arthroplasty. Normal alignment. Total fluoroscopy time: 14 seconds Total dose: Radiation Exposure Index (as provided by the fluoroscopic device): 2.4 mGy air Kerma Please see intraoperative findings for further detail. IMPRESSION: Intraoperative fluoroscopy, as above. Electronically Signed   By: Harrietta Sherry M.D.   On: 11/28/2023 11:47   DG C-Arm 1-60 Min-No Report Result Date: 11/28/2023 Fluoroscopy was utilized by the requesting physician.  No radiographic interpretation.   DG C-Arm 1-60 Min-No Report Result Date: 11/28/2023 Fluoroscopy was utilized by the requesting physician.  No radiographic interpretation.   DG C-Arm 1-60 Min-No Report Result Date: 11/28/2023 Fluoroscopy was utilized by the requesting physician.  No radiographic interpretation.    Disposition: Discharge disposition: 01-Home or Self Care       Discharge Instructions     Call MD / Call 911   Complete by: As directed    If you experience chest pain or shortness of breath, CALL 911 and be  transported to the hospital emergency room.  If you develope a fever above 101 F, pus (white drainage) or increased drainage or redness at the wound, or calf pain, call your surgeon's office.   Change dressing   Complete by: As directed    Do not change your dressing.   Constipation Prevention   Complete by: As directed    Drink plenty of fluids.  Prune juice may be helpful.  You may use a stool softener, such as Colace (over the counter) 100 mg twice a day.  Use MiraLax  (over the counter) for constipation as needed.   Diet - low sodium heart healthy   Complete by: As directed    Discharge instructions   Complete by: As directed    Elevate toes above nose. Use cryotherapy as needed for pain and swelling.   Driving restrictions   Complete by: As directed    No driving for 6 weeks   Increase activity slowly as tolerated   Complete by: As directed    Lifting restrictions   Complete by: As directed    No lifting for 6 weeks   Post-operative opioid taper instructions:   Complete by: As directed    POST-OPERATIVE OPIOID TAPER INSTRUCTIONS: It is important to wean off of your opioid medication as soon as possible. If you do not need pain medication after your surgery it is ok to stop day one. Opioids include: Codeine, Hydrocodone (Norco, Vicodin), Oxycodone (Percocet, oxycontin ) and hydromorphone  amongst others.  Long term and even short term use of opiods can cause: Increased pain response Dependence Constipation Depression Respiratory depression And more.  Withdrawal symptoms can include Flu like symptoms Nausea, vomiting And more Techniques to manage these symptoms Hydrate well Eat regular healthy meals Stay active Use relaxation techniques(deep breathing, meditating, yoga) Do Not substitute Alcohol  to help with tapering If you have been on opioids for less than two weeks and do not have pain than it is ok to stop all together.  Plan to wean off of opioids This plan should  start within one week post op of your joint replacement. Maintain the same interval or time between taking each dose and first decrease the dose.  Cut the total daily intake of opioids by one tablet each day Next start to increase the time between doses. The last dose that should be eliminated is the evening dose.      TED hose   Complete by:  As directed    Use stockings (TED hose) for 2 weeks on both leg(s).  You may remove them at night for sleeping.   Weight bearing as tolerated   Complete by: As directed         Follow-up Information     Leigh Valery RAMAN, PA-C. Schedule an appointment as soon as possible for a visit in 2 week(s).   Specialty: Orthopedic Surgery Why: For suture removal, For wound re-check Contact information: [address] [place] [address] [phone]                  Signed: Valery RAMAN Leigh 11/29/2023, 1:51 PM

## 2023-11-29 NOTE — Progress Notes (Signed)
    Subjective:  Patient reports pain as mild to none.  Denies N/V/CP/SOB/Abd pain.  Voiding without difficulty. Denies tingling or numbness in LE bilaterally.  She is eager for d/c home.   Objective:   VITALS:   Vitals:   11/28/23 1717 11/28/23 2220 11/29/23 0234 11/29/23 0612  BP: (!) 160/97 (!) 165/77 (!) 155/74 (!) 159/58  Pulse: 82 74 76 64  Resp: 15 16 16 16   Temp: (!) 97.5 F (36.4 C) (!) 97.5 F (36.4 C) 97.8 F (36.6 C) 97.6 F (36.4 C)  TempSrc:      SpO2: 99% 98% 95% 97%  Weight:      Height:        NAD Neurologically intact ABD soft Neurovascular intact Sensation intact distally Intact pulses distally Dorsiflexion/Plantar flexion intact Incision: dressing C/D/I No cellulitis present Compartment soft   Lab Results  Component Value Date   WBC 18.8 (H) 11/29/2023   HGB 11.8 (L) 11/29/2023   HCT 36.0 11/29/2023   MCV 93.0 11/29/2023   PLT 276 11/29/2023   BMET    Component Value Date/Time   NA 135 11/29/2023 0335   NA 140 09/20/2023 1057   K 3.6 11/29/2023 0335   CL 107 11/29/2023 0335   CO2 23 11/29/2023 0335   GLUCOSE 156 (H) 11/29/2023 0335   BUN 18 11/29/2023 0335   BUN 10 09/20/2023 1057   CREATININE 0.76 11/29/2023 0335   CREATININE 0.78 09/15/2021 1218   CALCIUM  8.3 (L) 11/29/2023 0335   EGFR 89 09/20/2023 1057   GFRNONAA >60 11/29/2023 0335   GFRNONAA 85 11/24/2019 0849     Assessment/Plan: 1 Day Post-Op   Principal Problem:   Osteoarthritis of right hip Active Problems:   S/P total right hip arthroplasty   WBAT with walker DVT ppx: Aspirin , SCDs, TEDS PO pain control PT/OT: She ambulated 40 feet with PT yesterday. Continue today.  Dispo:  - D/c home with HEP once cleared with PT.   [name] [name] Potters 11/29/2023, 8:47 AM   [facility]  [place] [address], [place], [place] [address] Phone: [phone] [email or web address] Facebook  [employer]

## 2023-11-29 NOTE — Progress Notes (Signed)
 Physical Therapy Treatment Patient Details Name: [name] [name] MRN: [id number] DOB: January 19, 1961 Today's Date: 11/29/2023   History of Present Illness 63 y.o. female admitted 11/28/23 for R THA-AA. PMH: B TKA, back surgery, HTN, DDD-cervical, vestibular migraine.    PT Comments  Pt is mobilizing well, PT goals have been met, she is ready to DC home. Pt ambulated 160' with RW, no loss of balance. Stair training completed. Pt demonstrates good understanding of HEP.      If plan is discharge home, recommend the following: A little help with walking and/or transfers;A little help with bathing/dressing/bathroom;Assistance with cooking/housework;Assist for transportation;Help with stairs or ramp for entrance   Can travel by private vehicle        Equipment Recommendations  None recommended by PT    Recommendations for Other Services       Precautions / Restrictions Precautions Precautions: Fall Recall of Precautions/Restrictions: Intact Precaution/Restrictions Comments: reports 1 fall in past 6 months Restrictions Weight Bearing Restrictions Per Provider Order: No RLE Weight Bearing Per Provider Order: Weight bearing as tolerated     Mobility  Bed Mobility Overal bed mobility: Modified Independent Bed Mobility: Supine to Sit     Supine to sit: Modified independent (Device/Increase time), Used rails, HOB elevated          Transfers Overall transfer level: Needs assistance Equipment used: Rolling walker (2 wheels) Transfers: Sit to/from Stand Sit to Stand: Supervision           General transfer comment: VCs hand placement    Ambulation/Gait Ambulation/Gait assistance: Supervision Gait Distance (Feet): 160 Feet Assistive device: Rolling walker (2 wheels) Gait Pattern/deviations: Step-to pattern, Decreased step length - left, Decreased step length - right Gait velocity: decr     General Gait Details: steady, no loss of balance   Stairs Stairs: Yes   Stair  Management: Forwards, One rail Left, Step to pattern, With cane Number of Stairs: 2 General stair comments: VCs sequencing, no loss of balance   Wheelchair Mobility     Tilt Bed    Modified Rankin (Stroke Patients Only)       Balance Overall balance assessment: Modified Independent, History of Falls                                          Communication Communication Communication: No apparent difficulties  Cognition Arousal: Alert Behavior During Therapy: WFL for tasks assessed/performed   PT - Cognitive impairments: No apparent impairments                         Following commands: Intact      Cueing Cueing Techniques: Verbal cues  Exercises Total Joint Exercises Ankle Circles/Pumps: AROM, Both, 10 reps, Supine Quad Sets: AROM, Both, 5 reps, Supine Short Arc Quad: AROM, Right, 5 reps, Supine Heel Slides: AAROM, Right, 10 reps, Supine Hip ABduction/ADduction: AAROM, Right, 10 reps, Supine Long Arc Quad: AROM, Right, 10 reps, Seated    General Comments        Pertinent Vitals/Pain Pain Assessment Pain Score: 3  Pain Location: R hip Pain Descriptors / Indicators: Sore Pain Intervention(s): Limited activity within patient's tolerance, Monitored during session, Premedicated before session, Ice applied, Repositioned    Home Living  Prior Function            PT Goals (current goals can now be found in the care plan section) Acute Rehab PT Goals Patient Stated Goal: to walk without pain, go on vacation to [facility] PT Goal Formulation: With patient Time For Goal Achievement: 12/05/23 Potential to Achieve Goals: Good Progress towards PT goals: Goals met/education completed, patient discharged from PT    Frequency    7X/week      PT Plan      Co-evaluation              AM-PAC PT 6 Clicks Mobility   Outcome Measure  Help needed turning from your back to your side while in a  flat bed without using bedrails?: None Help needed moving from lying on your back to sitting on the side of a flat bed without using bedrails?: A Little Help needed moving to and from a bed to a chair (including a wheelchair)?: None Help needed standing up from a chair using your arms (e.g., wheelchair or bedside chair)?: None Help needed to walk in hospital room?: None Help needed climbing 3-5 steps with a railing? : A Little 6 Click Score: 22    End of Session Equipment Utilized During Treatment: Gait belt Activity Tolerance: Patient tolerated treatment well Patient left: in chair;with call bell/phone within reach;with chair alarm set Nurse Communication: Mobility status PT Visit Diagnosis: Difficulty in walking, not elsewhere classified (R26.2);Pain Pain - Right/Left: Right Pain - part of body: Hip     Time: 9075-9043 PT Time Calculation (min) (ACUTE ONLY): 32 min  Charges:    $Gait Training: 8-22 mins $Therapeutic Exercise: 8-22 mins PT General Charges $$ ACUTE PT VISIT: 1 Visit                     Sylvan Delon Copp PT 11/29/2023  Acute Rehabilitation Services  Office [phone]

## 2023-11-29 NOTE — TOC Transition Note (Signed)
 Transition of Care [facility]) - Discharge Note   Patient Details  Name: [name] [name] MRN: [id number] Date of Birth: 1961/02/21  Transition of Care [facility]) CM/SW Contact:  Alfonse JONELLE Rex, RN Phone Number: 11/29/2023, 9:28 AM   Clinical Narrative:   Met with patient at bedside to review dc therapy and home equipment needs, pt confirmed HEP, has RW, no home DME needs. No TOC needs.     Final next level of care: Home/Self Care Barriers to Discharge: No Barriers Identified   Patient Goals and CMS Choice Patient states their goals for this hospitalization and ongoing recovery are:: return home          Discharge Placement                       Discharge Plan and Services Additional resources added to the After Visit Summary for                                       Social Drivers of Health (SDOH) Interventions SDOH Screenings   Food Insecurity: No Food Insecurity (11/28/2023)  Housing: Low Risk  (11/28/2023)  Transportation Needs: No Transportation Needs (11/28/2023)  Utilities: Not At Risk (11/28/2023)  Alcohol  Screen: Low Risk  (11/08/2023)  Depression (PHQ2-9): Low Risk  (11/12/2023)  Financial Resource Strain: Low Risk  (11/08/2023)  Physical Activity: Inactive (11/08/2023)  Social Connections: Socially Isolated (11/28/2023)  Stress: No Stress Concern Present (11/08/2023)  Tobacco Use: Low Risk  (11/28/2023)  Health Literacy: Adequate Health Literacy (03/13/2023)     Readmission Risk Interventions     No data to display

## 2023-11-29 NOTE — Progress Notes (Signed)
 Reviewed written d/c instructions w pt and all questions answered, she verbalized understanding. D/C via w/c w all belongings in stable condition.

## 2023-11-29 NOTE — Plan of Care (Signed)
  Problem: Education: Goal: Knowledge of General Education information will improve Description: Including pain rating scale, medication(s)/side effects and non-pharmacologic comfort measures Outcome: Progressing   Problem: Health Behavior/Discharge Planning: Goal: Ability to manage health-related needs will improve Outcome: Progressing   Problem: Clinical Measurements: Goal: Ability to maintain clinical measurements within normal limits will improve Outcome: Progressing Goal: Will remain free from infection Outcome: Progressing Goal: Diagnostic test results will improve Outcome: Progressing Goal: Respiratory complications will improve Outcome: Progressing Goal: Cardiovascular complication will be avoided Outcome: Progressing   Problem: Activity: Goal: Risk for activity intolerance will decrease Outcome: Progressing   Problem: Nutrition: Goal: Adequate nutrition will be maintained Outcome: Progressing   Problem: Coping: Goal: Level of anxiety will decrease Outcome: Progressing   Problem: Elimination: Goal: Will not experience complications related to bowel motility Outcome: Progressing Goal: Will not experience complications related to urinary retention Outcome: Progressing   Problem: Pain Managment: Goal: General experience of comfort will improve and/or be controlled Outcome: Progressing   Problem: Safety: Goal: Ability to remain free from injury will improve Outcome: Progressing   Problem: Skin Integrity: Goal: Risk for impaired skin integrity will decrease Outcome: Progressing   Problem: Education: Goal: Knowledge of the prescribed therapeutic regimen will improve Outcome: Progressing Goal: Understanding of discharge needs will improve Outcome: Progressing Goal: Individualized Educational Video(s) Outcome: Progressing   Problem: Activity: Goal: Ability to avoid complications of mobility impairment will improve Outcome: Progressing Goal: Ability to  tolerate increased activity will improve Outcome: Progressing   Problem: Clinical Measurements: Goal: Postoperative complications will be avoided or minimized Outcome: Progressing   Problem: Pain Management: Goal: Pain level will decrease with appropriate interventions Outcome: Progressing

## 2023-12-04 NOTE — Telephone Encounter (Signed)
 Form was completed by chelsa 11-12-2023 is scanned in to media

## 2024-02-01 ENCOUNTER — Ambulatory Visit (HOSPITAL_COMMUNITY): Payer: Self-pay | Admitting: Internal Medicine

## 2024-02-01 ENCOUNTER — Encounter (HOSPITAL_COMMUNITY): Payer: Self-pay

## 2024-02-01 ENCOUNTER — Ambulatory Visit (HOSPITAL_COMMUNITY)
Admission: EM | Admit: 2024-02-01 | Discharge: 2024-02-01 | Disposition: A | Discharge status: Home / Self Care | Attending: Internal Medicine | Admitting: Internal Medicine

## 2024-02-01 DIAGNOSIS — R197 Diarrhea, unspecified: Secondary | ICD-10-CM | POA: Insufficient documentation

## 2024-02-01 DIAGNOSIS — R1114 Bilious vomiting: Secondary | ICD-10-CM | POA: Insufficient documentation

## 2024-02-01 DIAGNOSIS — R509 Fever, unspecified: Secondary | ICD-10-CM | POA: Insufficient documentation

## 2024-02-01 DIAGNOSIS — R1013 Epigastric pain: Secondary | ICD-10-CM | POA: Insufficient documentation

## 2024-02-01 LAB — POCT URINALYSIS DIP (MANUAL ENTRY)
Bilirubin, UA: NEGATIVE
Glucose, UA: NEGATIVE mg/dL
Ketones, POC UA: NEGATIVE mg/dL
Leukocytes, UA: NEGATIVE
Nitrite, UA: NEGATIVE
Protein Ur, POC: NEGATIVE mg/dL
Spec Grav, UA: 1.015 (ref 1.010–1.025)
Urobilinogen, UA: 0.2 U/dL
pH, UA: 6 (ref 5.0–8.0)

## 2024-02-01 LAB — COMPREHENSIVE METABOLIC PANEL WITH GFR
ALT: 53 U/L — ABNORMAL HIGH (ref 0–44)
AST: 43 U/L — ABNORMAL HIGH (ref 15–41)
Albumin: 2.9 g/dL — ABNORMAL LOW (ref 3.5–5.0)
Alkaline Phosphatase: 153 U/L — ABNORMAL HIGH (ref 38–126)
Anion gap: 14 (ref 5–15)
BUN: 8 mg/dL (ref 8–23)
CO2: 26 mmol/L (ref 22–32)
Calcium: 8.9 mg/dL (ref 8.9–10.3)
Chloride: 101 mmol/L (ref 98–111)
Creatinine, Ser: 0.77 mg/dL (ref 0.44–1.00)
GFR, Estimated: >60 mL/min (ref >60)
Glucose, Bld: 97 mg/dL (ref 70–99)
Potassium: 3.4 mmol/L — ABNORMAL LOW (ref 3.5–5.1)
Sodium: 141 mmol/L (ref 135–145)
Total Bilirubin: 0.6 mg/dL (ref 0.0–1.2)
Total Protein: 6.8 g/dL (ref 6.5–8.1)

## 2024-02-01 LAB — CBC
HCT: 42.5 % (ref 36.0–46.0)
Hemoglobin: 13.9 g/dL (ref 12.0–15.0)
MCH: 29.6 pg (ref 26.0–34.0)
MCHC: 32.7 g/dL (ref 30.0–36.0)
MCV: 90.4 fL (ref 80.0–100.0)
Platelets: 434 K/uL — ABNORMAL HIGH (ref 150–400)
RBC: 4.7 MIL/uL (ref 3.87–5.11)
RDW: 13.4 % (ref 11.5–15.5)
WBC: 13.2 K/uL — ABNORMAL HIGH (ref 4.0–10.5)
nRBC: 0 % (ref 0.0–0.2)

## 2024-02-01 LAB — LIPASE, BLOOD: Lipase: 20 U/L (ref 11–51)

## 2024-02-01 MED ORDER — ONDANSETRON 8 MG PO TBDP: 8.0000 mg | ORAL_TABLET | Freq: Three times a day (TID) | ORAL | 0 refills | Status: DC | PRN

## 2024-02-01 MED ORDER — ONDANSETRON 4 MG PO TBDP
8.0000 mg | ORAL_TABLET | Freq: Once | ORAL | Status: AC
  Administered 2024-02-01: 8 mg via ORAL

## 2024-02-01 MED ORDER — ONDANSETRON 4 MG PO TBDP
ORAL_TABLET | ORAL | Status: AC
  Filled 2024-02-01: qty 2

## 2024-02-01 NOTE — ED Triage Notes (Signed)
 Patient reports that she has had a fever, fatigue, N/v/D x 1 week. Patient states her fever this AM was 101.6.  Patient states she had a negative Covid test  4 days ago.  Patient states she has been taking Zofran  from a previous prescription, Tylenol , and Pepto Bismol for her symptoms.

## 2024-02-01 NOTE — Discharge Instructions (Addendum)
 Urinalysis done today only showed a trace amount of blood.  Vital signs and physical exam findings are reassuring.  Unsure the definitive cause of the nausea, vomiting and diarrhea.  We have drawn lab work today including a complete blood count, complete metabolic panel and lipase.  This will check for anemia, elevated Mattalynn Crandle blood cells, liver, kidney and pancreas functions.  This lab work should be available later today.  If there are any significant abnormalities we will likely have you go to the emergency room for further evaluation.  If it anytime your symptoms worsen significantly or you are unable to maintain hydration then recommend going to the emergency room.  Will temporarily increase Zofran  to 8 mg every 8 hours as needed for nausea. First dose given here at 12:00, next dose will be due around 8 to 9 PM.  Recommend trying Imodium A-D for the diarrhea.  Make sure to stay hydrated by drinking plenty of water .  If you continue to have no improvement in symptoms in the next 2 to 3 days then recommend returning to urgent care or going to your primary care provider.

## 2024-02-01 NOTE — ED Provider Notes (Signed)
 [facility]    CSN: [id number] Arrival date & time: 02/01/24  1056      History   Chief Complaint Chief Complaint  Patient presents with   Emesis   Diarrhea   Fever    HPI [name] [name] is a 63 y.o. female.   63 year old female presents urgent care with complaints of nausea, vomiting, diarrhea and fatigue.  This started about a week ago.  She has been taking Pepto-Bismol, Tylenol  and Zofran .  She reports that first thing in the morning she has bilious vomiting.  She remains nauseated throughout the day.  She is also having episodes of diarrhea especially if she eats much.  She has been able to keep some water  and Gatorade down.  She did have a little bit of rice that she did not vomit as well as some toast yesterday.  She also relates that she is running fevers of up to 101.6 with most recently checking it this morning prior to taking Tylenol .  She says that when she reaches about 5 hours after Tylenol  she starts to feel the fever returning.  She did take a COVID test several days ago that was negative.  She denies any congestion, cough, chest pain, headache, syncope.  She relates that she is really not having any abdominal pain just a rolling sensation due to her bowel movements.   Emesis Associated symptoms: diarrhea and fever   Associated symptoms: no abdominal pain, no arthralgias, no chills, no cough, no headaches and no sore throat   Diarrhea Associated symptoms: fever and vomiting   Associated symptoms: no abdominal pain, no arthralgias, no chills and no headaches   Fever Associated symptoms: diarrhea, nausea and vomiting   Associated symptoms: no chest pain, no chills, no cough, no dysuria, no ear pain, no headaches, no rash and no sore throat     Past Medical History:  Diagnosis Date   Anxiety 2008   seen in ED /w chest pain but told that she was having anxiety   Arthritis    Colon polyps    History of kidney stones 1999   History of migraine     History of stress test 2010   done due to chest pain /w Dr. Ladona. Pt. told that it was wnl.   Hives of unknown origin    told by PCP [facility] [place]) to use Zrytec BID to control hives    Hypertension    Kidney stones    Neuromuscular disorder (HCC)    back pain   Obesity, unspecified    Sleep difficulties    has had a sleep study - t old that she didn't meet criteria for machine    Syncope    due to pain    Patient Active Problem List   Diagnosis Date Noted   Osteoarthritis of right hip 11/28/2023   S/P total right hip arthroplasty 11/28/2023   Hip pain, chronic, right 11/12/2023   Prediabetes 09/20/2023   DDD (degenerative disc disease), cervical 07/19/2023   Sinusitis 07/19/2023   Iron  deficiency anemia 05/23/2023   Cervical radiculopathy 04/16/2023   Colon cancer screening 08/01/2022   History of colonic polyps 08/01/2022   Abnormal brain MRI 07/02/2017   Insomnia 02/26/2017   Headache disorder 01/24/2017   Vestibular migraine 12/25/2016   Sensorineural hearing loss (SNHL), bilateral 12/25/2016   Vitamin D  deficiency 08/02/2015   HTN (hypertension) 05/25/2015   Syncope 02/17/2015   DDD (degenerative disc disease), lumbar 08/29/2013    Past Surgical  History:  Procedure Laterality Date   CHOLECYSTECTOMY     COLONOSCOPY  2016   DECOMPRESSIVE LUMBAR LAMINECTOMY LEVEL 1 N/A 10/28/2013   Procedure: DECOMPRESSION L4-5;  Surgeon: Donaciano Sprang, MD;  Location: [facility];  Service: Orthopedics;  Laterality: N/A;   KNEE ARTHROPLASTY Right 12/24/2018   Procedure: COMPUTER ASSISTED TOTAL KNEE ARTHROPLASTY;  Surgeon: Fidel Rogue, MD;  Location: [facility];  Service: Orthopedics;  Laterality: Right;   KNEE ARTHROPLASTY Left 09/17/2019   Procedure: COMPUTER ASSISTED TOTAL KNEE ARTHROPLASTY;  Surgeon: Fidel Rogue, MD;  Location: [facility];  Service: Orthopedics;  Laterality: Left;   KNEE ARTHROSCOPY  12/2008   left knee   KNEE ARTHROSCOPY Right 01/27/2018   PARTIAL HYSTERECTOMY  10/1994    thumb surgery Right 03/1975   TOTAL HIP ARTHROPLASTY Right 11/28/2023   Procedure: ARTHROPLASTY, HIP, TOTAL, ANTERIOR APPROACH;  Surgeon: Fidel Rogue, MD;  Location: [facility];  Service: Orthopedics;  Laterality: Right;    OB History   No obstetric history on file.      Home Medications    Prior to Admission medications   Medication Sig Start Date End Date Taking? Authorizing Provider  atorvastatin  (LIPITOR) 20 MG tablet TAKE 1 TABLET BY MOUTH EVERY DAY 09/02/23   Del Orbe Polanco, Iliana, FNP  cetirizine  (ZYRTEC ) 10 MG tablet Take 2 tablets (20 mg total) by mouth 2 (two) times daily as needed for allergies (hives). Patient taking differently: Take 10 mg by mouth 2 (two) times daily. 08/12/23   Tobie Arleta SQUIBB, MD  diltiazem  (CARDIZEM  CD) 120 MG 24 hr capsule TAKE 1 CAPSULE BY MOUTH EVERY DAY 06/03/23   Duanne Butler DASEN, MD  ferrous sulfate  325 (65 FE) MG EC tablet Take 325 mg by mouth in the morning.    [provider]  hydrochlorothiazide  (MICROZIDE ) 12.5 MG capsule TAKE 1 CAPSULE BY MOUTH EVERY DAY 10/11/23   Del Orbe Polanco, Iliana, FNP  methocarbamol  (ROBAXIN ) 500 MG tablet Take 1 tablet (500 mg total) by mouth every 6 (six) hours as needed for muscle spasms. Patient not taking: Reported on 02/01/2024 11/29/23   Leigh Valery RAMAN, PA-C  Misc Natural Products (BEET ROOT PO) Take 2 tablets by mouth in the morning. Super Beets Patient not taking: Reported on 02/01/2024    [provider]  Multiple Vitamins-Minerals (HAIR SKIN NAILS PO) Take by mouth.    [provider]  ondansetron  (ZOFRAN ) 4 MG tablet Take 1 tablet (4 mg total) by mouth every 8 (eight) hours as needed for nausea or vomiting. 11/29/23 11/28/24  Leigh Valery RAMAN, PA-C  triamcinolone  ointment (KENALOG ) 0.5 % Apply 1 Application topically 2 (two) times daily. Patient taking differently: Apply 1 Application topically 2 (two) times daily as needed (rash/irritation). 09/17/22   Del Wilhelmena Lloyd Sola, FNP     Family History Family History  Problem Relation Age of Onset   Stroke Father    Diabetes Other    Hyperlipidemia Other    Heart disease Other    Hypertension Other    Cancer Other    Breast cancer Neg Hx    Colon cancer Neg Hx    Esophageal cancer Neg Hx    Stomach cancer Neg Hx    Rectal cancer Neg Hx    Allergic rhinitis Neg Hx    Angioedema Neg Hx    Asthma Neg Hx    Eczema Neg Hx    Urticaria Neg Hx     Social History Social History   Tobacco Use  Smoking status: Never    Passive exposure: Never   Smokeless tobacco: Never  Vaping Use   Vaping status: Never Used  Substance Use Topics   Alcohol  use: Yes    Comment: rare   Drug use: No     Allergies   American cockroach, Gabapentin, and Penicillins   Review of Systems Review of Systems  Constitutional:  Positive for appetite change, fatigue and fever. Negative for chills.  HENT:  Negative for ear pain and sore throat.   Eyes:  Negative for pain and visual disturbance.  Respiratory:  Negative for cough and shortness of breath.   Cardiovascular:  Negative for chest pain and palpitations.  Gastrointestinal:  Positive for diarrhea, nausea and vomiting. Negative for abdominal pain.  Genitourinary:  Negative for decreased urine volume, difficulty urinating, dysuria, frequency, hematuria and urgency.  Musculoskeletal:  Negative for arthralgias and back pain.  Skin:  Negative for color change and rash.  Neurological:  Negative for dizziness, seizures, syncope and headaches.  All other systems reviewed and are negative.    Physical Exam Triage Vital Signs ED Triage Vitals [02/01/24 1132]  Encounter Vitals Group     BP 132/87     Girls Systolic BP Percentile      Girls Diastolic BP Percentile      Boys Systolic BP Percentile      Boys Diastolic BP Percentile      Pulse Rate 83     Resp 16     Temp 98 F (36.7 C)     Temp Source Oral     SpO2 96 %     Weight      Height      Head Circumference       Peak Flow      Pain Score      Pain Loc      Pain Education      Exclude from Growth Chart    No data found.  Updated Vital Signs BP 132/87 (BP Location: Left Arm)   Pulse 83   Temp 98 F (36.7 C) (Oral)   Resp 16   SpO2 96%   Visual Acuity Right Eye Distance:   Left Eye Distance:   Bilateral Distance:    Right Eye Near:   Left Eye Near:    Bilateral Near:     Physical Exam Vitals and nursing note reviewed.  Constitutional:      General: She is not in acute distress.    Appearance: She is well-developed.  HENT:     Head: Normocephalic and atraumatic.     Nose: No congestion.     Mouth/Throat:     Mouth: Mucous membranes are moist.  Eyes:     Conjunctiva/sclera: Conjunctivae normal.  Cardiovascular:     Rate and Rhythm: Normal rate and regular rhythm.     Heart sounds: No murmur heard. Pulmonary:     Effort: Pulmonary effort is normal. No respiratory distress.     Breath sounds: Normal breath sounds.  Abdominal:     General: Bowel sounds are normal.     Palpations: Abdomen is soft.     Tenderness: There is abdominal tenderness in the epigastric area. There is no guarding or rebound.     Hernia: There is no hernia in the umbilical area.  Musculoskeletal:        General: No swelling.     Cervical back: Neck supple.  Skin:    General: Skin is warm and dry.  Capillary Refill: Capillary refill takes less than 2 seconds.  Neurological:     Mental Status: She is alert.  Psychiatric:        Mood and Affect: Mood normal.      UC Treatments / Results  Labs (all labs ordered are listed, but only abnormal results are displayed) Labs Reviewed  POCT URINALYSIS DIP (MANUAL ENTRY) - Abnormal; Notable for the following components:      Result Value   Blood, UA trace-intact (*)    All other components within normal limits  CBC  COMPREHENSIVE METABOLIC PANEL WITH GFR  LIPASE, BLOOD    EKG   Radiology No results found.  Procedures Procedures  (including critical care time)  Medications Ordered in UC Medications  ondansetron  (ZOFRAN -ODT) disintegrating tablet 8 mg (8 mg Oral Given 02/01/24 1220)    Initial Impression / Assessment and Plan / UC Course  I have reviewed the triage vital signs and the nursing notes.  Pertinent labs & imaging results that were available during my care of the patient were reviewed by me and considered in my medical decision making (see chart for details).     Abdominal pain, epigastric - Plan: POC urinalysis dipstick, POC urinalysis dipstick  Bilious vomiting with nausea - Plan: POC urinalysis dipstick, POC urinalysis dipstick  Diarrhea of presumed infectious origin - Plan: POC urinalysis dipstick, POC urinalysis dipstick  Fever in adult - Plan: POC urinalysis dipstick, POC urinalysis dipstick   Urinalysis done today only showed a trace amount of blood.  Vital signs and physical exam findings are reassuring.  Unsure the definitive cause of the nausea, vomiting and diarrhea.  We have drawn lab work today including a complete blood count, complete metabolic panel and lipase.  This will check for anemia, elevated Hurbert Duran blood cells, liver, kidney and pancreas functions.  This lab work should be available later today.  If there are any significant abnormalities we will likely have you go to the emergency room for further evaluation.  If it anytime your symptoms worsen significantly or you are unable to maintain hydration then recommend going to the emergency room.  Will temporarily increase Zofran  to 8 mg every 8 hours as needed for nausea.  Recommend trying Imodium A-D for the diarrhea.  Make sure to stay hydrated by drinking plenty of water .  If you continue to have no improvement in symptoms in the next 2 to 3 days then recommend returning to urgent care or going to your primary care provider.  Final Clinical Impressions(s) / UC Diagnoses   Final diagnoses:  Abdominal pain, epigastric  Bilious vomiting  with nausea  Diarrhea of presumed infectious origin  Fever in adult     Discharge Instructions      Urinalysis done today only showed a trace amount of blood.  Vital signs and physical exam findings are reassuring.  Unsure the definitive cause of the nausea, vomiting and diarrhea.  We have drawn lab work today including a complete blood count, complete metabolic panel and lipase.  This will check for anemia, elevated Samit Sylve blood cells, liver, kidney and pancreas functions.  This lab work should be available later today.  If there are any significant abnormalities we will likely have you go to the emergency room for further evaluation.  If it anytime your symptoms worsen significantly or you are unable to maintain hydration then recommend going to the emergency room.  Will temporarily increase Zofran  to 8 mg every 8 hours as needed for nausea.  Recommend  trying Imodium A-D for the diarrhea.  Make sure to stay hydrated by drinking plenty of water .  If you continue to have no improvement in symptoms in the next 2 to 3 days then recommend returning to urgent care or going to your primary care provider.     ED Prescriptions   None    PDMP not reviewed this encounter.   Teresa Almarie LABOR, PA-C 02/01/24 1254

## 2024-02-03 ENCOUNTER — Ambulatory Visit: Payer: Self-pay

## 2024-02-03 NOTE — Telephone Encounter (Signed)
 FYI Only or Action Required?: Action required by provider: Requesting blood work .  Patient was last seen in primary care on 11/12/2023 by Glennon Sand, NP.  Called Nurse Triage reporting Vomiting.  Symptoms began a week ago.  Interventions attempted: OTC medications: Tylenol  and cold and flu meds and Prescription medications: Nausea meds.  Symptoms are: unchanged.  Triage Disposition: See PCP When Office is Open (Within 3 Days)  Patient/caregiver understands and will follow disposition?: Yes     Copied from CRM [id number]. Topic: Clinical - Red Word Triage >> Feb 03, 2024  8:06 AM Delon HERO wrote: Red Word that prompted transfer to Nurse Triage: Patient is calling to report that she has been sick over a week with chills, vomiting, diarrhea. - Went to the [facility] Saturday morning still continuing to have diarrhea and vomitting advised that her  WBC - high  Liver Function- High  Advised to follow up with PCP. Reason for Disposition  [1] MILD vomiting with diarrhea AND [2] present > 5 days  Answer Assessment - Initial Assessment Questions Taking tylenol  and cold and flu medicine for symptoms. Also taking medication for nausea. Patient requesting additional blood work to be done due to her symptoms.    1. VOMITING SEVERITY: How many times have you vomited in the past 24 hours?      Once this morning  2. ONSET: When did the vomiting begin?      1 week ago 9/27 3. FLUIDS: What fluids or food have you vomited up today? Have you been able to keep any fluids down?     Able to keep Gatorade and water  down. Eating toast and rice 4. ABDOMEN PAIN: Are your having any abdomen pain? If Yes : How bad is it and what does it feel like? (e.g., crampy, dull, intermittent, constant)      Denies  5. DIARRHEA: Is there any diarrhea? If Yes, ask: How many times today?      Yes,  6. CONTACTS: Is there anyone else in the family with the same symptoms?      Denies sick contacts  7.  CAUSE: What do you think is causing your vomiting?     Unsure 8. HYDRATION STATUS: Any signs of dehydration? (e.g., dry mouth [not only dry lips], too weak to stand) When did you last urinate?     She states her mouth is dry  9. OTHER SYMPTOMS: Do you have any other symptoms? (e.g., fever, headache, vertigo, vomiting blood or coffee grounds, recent head injury)     Chills, fever and lack of appetite  Protocols used: Vomiting-A-AH

## 2024-02-03 NOTE — Telephone Encounter (Signed)
 Patient scheduled.

## 2024-02-05 ENCOUNTER — Encounter: Payer: Self-pay | Admitting: Internal Medicine

## 2024-02-05 ENCOUNTER — Ambulatory Visit: Admitting: Internal Medicine

## 2024-02-05 VITALS — BP 125/74 | HR 92 | Ht 60.0 in | Wt 232.2 lb

## 2024-02-05 DIAGNOSIS — E876 Hypokalemia: Secondary | ICD-10-CM | POA: Insufficient documentation

## 2024-02-05 DIAGNOSIS — D72829 Elevated white blood cell count, unspecified: Secondary | ICD-10-CM

## 2024-02-05 DIAGNOSIS — K529 Noninfective gastroenteritis and colitis, unspecified: Secondary | ICD-10-CM | POA: Insufficient documentation

## 2024-02-05 DIAGNOSIS — R748 Abnormal levels of other serum enzymes: Secondary | ICD-10-CM | POA: Diagnosis not present

## 2024-02-05 MED ORDER — ONDANSETRON 8 MG PO TBDP: 8.0000 mg | ORAL_TABLET | Freq: Three times a day (TID) | ORAL | 1 refills | Status: DC | PRN

## 2024-02-05 MED ORDER — OMEPRAZOLE 20 MG PO CPDR: 20.0000 mg | DELAYED_RELEASE_CAPSULE | Freq: Every day | ORAL | 0 refills | Status: DC

## 2024-02-05 NOTE — Patient Instructions (Signed)
 Please start taking Omeprazole  once daily for 2 weeks and then as needed for acid reflux.  Please take Zofran  as needed for nausea/vomiting.

## 2024-02-05 NOTE — Progress Notes (Unsigned)
 Acute Office Visit  Subjective:    Patient ID: [name] [name], female    DOB: 01/11/1961, 63 y.o.   MRN: [id number]  Chief Complaint  Patient presents with   Emesis    Had sx of vomitting since 09/29, went to urgent care white blood cells were elevated was told to follow up with pcp to check blood levels have normalized.     HPI Patient is in today for complaint of nausea, vomiting and diarrhea for the last 2 weeks.  She went to urgent care on 02/01/24, had CBC and CMP done, and was given Zofran  for nausea.  She had tried Pepto-Bismol, Tylenol  and Zofran  4 mg prior to urgent care visit.  She was also having fever, up to 102 F at home.  Of note, she reports that she had not eaten outside for the last 2 months, but ate in a birthday party in [place] a day prior to her symptom onset.  She reports improvement in her symptoms since then.  Denies any episode of fever in the last 2 days.  Her diarrhea has also resolved now.  She still has nausea and NBNB vomiting.  She has been able to tolerate liquids and soft diet.  Denies any episode of melena or hematochezia.  She had mild leukocytosis and elevated liver enzymes from the blood tests in the urgent care.  Past Medical History:  Diagnosis Date   Anxiety 2008   seen in ED /w chest pain but told that she was having anxiety   Arthritis    Colon polyps    History of kidney stones 1999   History of migraine    History of stress test 2010   done due to chest pain /w Dr. Ladona. Pt. told that it was wnl.   Hives of unknown origin    told by PCP [facility]) to use Zrytec BID to control hives    Hypertension    Kidney stones    Neuromuscular disorder (HCC)    back pain   Obesity, unspecified    Sleep difficulties    has had a sleep study - t old that she didn't meet criteria for machine    Syncope    due to pain    Past Surgical History:  Procedure Laterality Date   CHOLECYSTECTOMY     COLONOSCOPY  2016   DECOMPRESSIVE LUMBAR  LAMINECTOMY LEVEL 1 N/A 10/28/2013   Procedure: DECOMPRESSION L4-5;  Surgeon: Donaciano Sprang, MD;  Location: [facility];  Service: Orthopedics;  Laterality: N/A;   KNEE ARTHROPLASTY Right 12/24/2018   Procedure: COMPUTER ASSISTED TOTAL KNEE ARTHROPLASTY;  Surgeon: Fidel Rogue, MD;  Location: [facility];  Service: Orthopedics;  Laterality: Right;   KNEE ARTHROPLASTY Left 09/17/2019   Procedure: COMPUTER ASSISTED TOTAL KNEE ARTHROPLASTY;  Surgeon: Fidel Rogue, MD;  Location: [facility];  Service: Orthopedics;  Laterality: Left;   KNEE ARTHROSCOPY  12/2008   left knee   KNEE ARTHROSCOPY Right 01/27/2018   PARTIAL HYSTERECTOMY  10/1994   thumb surgery Right 03/1975   TOTAL HIP ARTHROPLASTY Right 11/28/2023   Procedure: ARTHROPLASTY, HIP, TOTAL, ANTERIOR APPROACH;  Surgeon: Fidel Rogue, MD;  Location: [facility];  Service: Orthopedics;  Laterality: Right;    Family History  Problem Relation Age of Onset   Stroke Father    Diabetes Other    Hyperlipidemia Other    Heart disease Other    Hypertension Other    Cancer Other    Breast cancer Neg Hx  Colon cancer Neg Hx    Esophageal cancer Neg Hx    Stomach cancer Neg Hx    Rectal cancer Neg Hx    Allergic rhinitis Neg Hx    Angioedema Neg Hx    Asthma Neg Hx    Eczema Neg Hx    Urticaria Neg Hx     Social History   Socioeconomic History   Marital status: Single    Spouse name: Not on file   Number of children: 0   Years of education: Not on file   Highest education level: Bachelor's degree (e.g., BA, AB, BS)  Occupational History   Occupation: Radiographer, therapeutic  Tobacco Use   Smoking status: Never    Passive exposure: Never   Smokeless tobacco: Never  Vaping Use   Vaping status: Never Used  Substance and Sexual Activity   Alcohol  use: Yes    Comment: rare   Drug use: No   Sexual activity: Not on file  Other Topics Concern   Not on file  Social History Narrative   Not on file   Social Drivers of Health    Financial Resource Strain: Low Risk  (11/08/2023)   Overall Financial Resource Strain (CARDIA)    Difficulty of Paying Living Expenses: Not hard at all  Food Insecurity: No Food Insecurity (11/28/2023)   Hunger Vital Sign    Worried About Running Out of Food in the Last Year: Never true    Ran Out of Food in the Last Year: Never true  Transportation Needs: No Transportation Needs (11/28/2023)   PRAPARE - Administrator, Civil Service (Medical): No    Lack of Transportation (Non-Medical): No  Physical Activity: Inactive (11/08/2023)   Exercise Vital Sign    Days of Exercise per Week: 0 days    Minutes of Exercise per Session: Not on file  Stress: No Stress Concern Present (11/08/2023)   [employer] of Occupational Health - Occupational Stress Questionnaire    Feeling of Stress: Not at all  Social Connections: Socially Isolated (11/28/2023)   Social Connection and Isolation Panel    Frequency of Communication with Friends and Family: More than three times a week    Frequency of Social Gatherings with Friends and Family: More than three times a week    Attends Religious Services: Never    Database administrator or Organizations: No    Attends Banker Meetings: Never    Marital Status: Never married  Intimate Partner Violence: Not At Risk (11/28/2023)   Humiliation, Afraid, Rape, and Kick questionnaire    Fear of Current or Ex-Partner: No    Emotionally Abused: No    Physically Abused: No    Sexually Abused: No    Outpatient Medications Prior to Visit  Medication Sig Dispense Refill   atorvastatin  (LIPITOR) 20 MG tablet TAKE 1 TABLET BY MOUTH EVERY DAY 90 tablet 3   cetirizine  (ZYRTEC ) 10 MG tablet Take 2 tablets (20 mg total) by mouth 2 (two) times daily as needed for allergies (hives). (Patient taking differently: Take 10 mg by mouth 2 (two) times daily.) 120 tablet 5   diltiazem  (CARDIZEM  CD) 120 MG 24 hr capsule TAKE 1 CAPSULE BY MOUTH EVERY DAY 90  capsule 2   ferrous sulfate  325 (65 FE) MG EC tablet Take 325 mg by mouth in the morning.     hydrochlorothiazide  (MICROZIDE ) 12.5 MG capsule TAKE 1 CAPSULE BY MOUTH EVERY DAY 90 capsule 1   Multiple Vitamins-Minerals (HAIR  SKIN NAILS PO) Take by mouth.     triamcinolone  ointment (KENALOG ) 0.5 % Apply 1 Application topically 2 (two) times daily. (Patient taking differently: Apply 1 Application topically 2 (two) times daily as needed (rash/irritation).) 30 g 0   ondansetron  (ZOFRAN -ODT) 8 MG disintegrating tablet Take 1 tablet (8 mg total) by mouth every 8 (eight) hours as needed for nausea or vomiting. 10 tablet 0   methocarbamol  (ROBAXIN ) 500 MG tablet Take 1 tablet (500 mg total) by mouth every 6 (six) hours as needed for muscle spasms. (Patient not taking: Reported on 02/05/2024) 20 tablet 0   Misc Natural Products (BEET ROOT PO) Take 2 tablets by mouth in the morning. Super Beets (Patient not taking: Reported on 02/05/2024)     Facility-Administered Medications Prior to Visit  Medication Dose Route Frequency Provider Last Rate Last Admin   gadopentetate dimeglumine  (MAGNEVIST ) injection 20 mL  20 mL Intravenous Once PRN Sater, Charlie LABOR, MD        Allergies  Allergen Reactions   American Cockroach Other (See Comments)    unknown   Gabapentin     Dizziness   Penicillins Hives and Rash    Tolerated Cephalosporin Date: 11/28/23.      Review of Systems  Constitutional:  Negative for chills and fever.  HENT:  Negative for congestion and sore throat.   Eyes:  Negative for pain and discharge.  Respiratory:  Negative for cough and shortness of breath.   Cardiovascular:  Negative for chest pain and palpitations.  Gastrointestinal:  Positive for abdominal pain, nausea and vomiting.  Endocrine: Negative for polydipsia and polyuria.  Genitourinary:  Negative for dysuria and hematuria.  Musculoskeletal:  Negative for neck pain and neck stiffness.  Skin:  Negative for rash.  Neurological:   Negative for dizziness and weakness.  Psychiatric/Behavioral:  Negative for agitation and behavioral problems.        Objective:    Physical Exam Vitals reviewed.  Constitutional:      General: She is not in acute distress.    Appearance: She is not diaphoretic.  HENT:     Head: Normocephalic and atraumatic.     Nose: Nose normal.     Mouth/Throat:     Mouth: Mucous membranes are moist.  Eyes:     General: No scleral icterus.    Extraocular Movements: Extraocular movements intact.  Cardiovascular:     Rate and Rhythm: Normal rate and regular rhythm.     Heart sounds: Normal heart sounds. No murmur heard. Pulmonary:     Breath sounds: Normal breath sounds. No wheezing or rales.  Abdominal:     General: Bowel sounds are normal.     Palpations: Abdomen is soft.     Tenderness: There is no abdominal tenderness.  Musculoskeletal:     Cervical back: Neck supple. No tenderness.     Right lower leg: No edema.     Left lower leg: No edema.  Skin:    General: Skin is warm.     Findings: No rash.  Neurological:     General: No focal deficit present.     Mental Status: She is alert and oriented to person, place, and time.  Psychiatric:        Mood and Affect: Mood normal.        Behavior: Behavior normal.     BP 125/74   Pulse 92   Ht 5' (1.524 m)   Wt 232 lb 3.2 oz (105.3 kg)   SpO2 94%  BMI 45.35 kg/m  Wt Readings from Last 3 Encounters:  02/05/24 232 lb 3.2 oz (105.3 kg)  11/28/23 236 lb 15.9 oz (107.5 kg)  11/19/23 237 lb (107.5 kg)        Assessment & Plan:   Problem List Items Addressed This Visit       Digestive   Acute gastroenteritis - Primary   Had nausea, vomiting, fever and diarrhea, although fever and diarrhea have resolved now Reassured that most of the acute gastroenteritis self resolving Zofran  8 mg as needed for nausea or vomiting Started omeprazole  20 mg QD for 2 weeks and then PRN Advised to maintain adequate hydration and advance diet as  tolerated      Relevant Medications   ondansetron  (ZOFRAN -ODT) 8 MG disintegrating tablet   omeprazole  (PRILOSEC) 20 MG capsule   Other Relevant Orders   CBC with Differential/Platelet   CMP14+EGFR     Other   Hypokalemia due to excessive gastrointestinal loss of potassium   Last CMP showed hypokalemia, which could be due to episodes of vomiting and diarrhea Advised to eat potassium rich food such as avocado, spinach, tomato Recheck CMP after 2 weeks      Relevant Orders   CMP14+EGFR   Elevated liver enzymes   Last CMP reviewed Elevated liver enzymes likely due to recent acute gastroenteritis Advised to maintain adequate hydration Recheck CMP after 2 weeks      Relevant Orders   CMP14+EGFR   Leukocytosis   Likely due to recent acute gastroenteritis Recheck CBC after 2 weeks        Meds ordered this encounter  Medications   ondansetron  (ZOFRAN -ODT) 8 MG disintegrating tablet    Sig: Take 1 tablet (8 mg total) by mouth every 8 (eight) hours as needed for nausea or vomiting.    Dispense:  20 tablet    Refill:  1   omeprazole  (PRILOSEC) 20 MG capsule    Sig: Take 1 capsule (20 mg total) by mouth daily.    Dispense:  30 capsule    Refill:  0     Jhordan Kinter MARLA Blanch, MD

## 2024-02-06 DIAGNOSIS — D72829 Elevated white blood cell count, unspecified: Secondary | ICD-10-CM | POA: Insufficient documentation

## 2024-02-06 NOTE — Assessment & Plan Note (Signed)
 Last CMP reviewed Elevated liver enzymes likely due to recent acute gastroenteritis Advised to maintain adequate hydration Recheck CMP after 2 weeks

## 2024-02-06 NOTE — Assessment & Plan Note (Signed)
 Last CMP showed hypokalemia, which could be due to episodes of vomiting and diarrhea Advised to eat potassium rich food such as avocado, spinach, tomato Recheck CMP after 2 weeks

## 2024-02-06 NOTE — Assessment & Plan Note (Signed)
 Likely due to recent acute gastroenteritis Recheck CBC after 2 weeks

## 2024-02-06 NOTE — Assessment & Plan Note (Signed)
 Had nausea, vomiting, fever and diarrhea, although fever and diarrhea have resolved now Reassured that most of the acute gastroenteritis self resolving Zofran  8 mg as needed for nausea or vomiting Started omeprazole  20 mg QD for 2 weeks and then PRN Advised to maintain adequate hydration and advance diet as tolerated

## 2024-02-12 ENCOUNTER — Encounter (INDEPENDENT_AMBULATORY_CARE_PROVIDER_SITE_OTHER): Payer: Self-pay | Admitting: Gastroenterology

## 2024-02-17 ENCOUNTER — Ambulatory Visit: Admitting: Internal Medicine

## 2024-02-17 ENCOUNTER — Encounter: Payer: Self-pay | Admitting: Internal Medicine

## 2024-02-17 ENCOUNTER — Other Ambulatory Visit: Payer: Self-pay

## 2024-02-17 VITALS — BP 120/74 | HR 92 | Temp 97.9°F | Resp 18 | Ht 60.63 in | Wt 232.0 lb

## 2024-02-17 DIAGNOSIS — L501 Idiopathic urticaria: Secondary | ICD-10-CM | POA: Diagnosis not present

## 2024-02-17 MED ORDER — CETIRIZINE HCL 10 MG PO TABS: 20.0000 mg | ORAL_TABLET | Freq: Two times a day (BID) | ORAL | 5 refills | Status: AC | PRN

## 2024-02-17 MED ORDER — FAMOTIDINE 20 MG PO TABS: 20.0000 mg | ORAL_TABLET | Freq: Two times a day (BID) | ORAL | 5 refills | Status: DC | PRN

## 2024-02-17 MED ORDER — HYDROXYZINE HCL 10 MG PO TABS: 10.0000 mg | ORAL_TABLET | Freq: Every evening | ORAL | 1 refills | Status: AC | PRN

## 2024-02-17 NOTE — Patient Instructions (Addendum)
 Idiopathic Urticaria (Hives): - At this time etiology of hives and swelling is unknown. Hives can be caused by a variety of different triggers including stress, illness/infection, exercise, pressure, vibrations, extremes of temperature to name a few however majority of the time there is no identifiable trigger.  - 09/2022: normal tryptase, TSH, CU index, alpha gal  - Continue Zyrtec  10mg  in daytime and 20mg  in evening. Once hive free for several weeks, decrease to Zyrtec  10mg  twice daily.   - If hives worsen, increase to Zyrtec  20mg  twice daily and add Pepcid  20mg  twice daily.   - Use hydroxyzine  10mg  nightly as needed for itching/hives. - Consider Xolair, if hives worsen.

## 2024-02-17 NOTE — Progress Notes (Signed)
   FOLLOW UP Date of Service/Encounter:  02/17/24   Subjective:  [name] [name] (DOB: 1960/10/07) is a 63 y.o. female who [facility] for follow up for idiopathic urticaria.   History obtained from: chart review and patient.Last seen by me on 08/12/2023 and at the time was doing very well on Zyrtec  BID.   Doing okay since last visit. Underwent hip surgery and still having trouble with the hip and recover.  Also was sick a few weeks ago with upper respiratory symptoms, vomiting, nausea; given antibiotics.  Now doing better.  Did have an outbreak of hives when sick requiring increase of Zyrtec  to 10mg  in AM, 20mg  in PM and took hydroxyzine .  Not on Pepcid .    Past Medical History: Past Medical History:  Diagnosis Date   Anxiety 2008   seen in ED /w chest pain but told that she was having anxiety   Arthritis    Colon polyps    History of kidney stones 1999   History of migraine    History of stress test 2010   done due to chest pain /w Dr. Ladona. Pt. told that it was wnl.   Hives of unknown origin    told by PCP [facility]) to use Zrytec BID to control hives    Hypertension    Kidney stones    Neuromuscular disorder (HCC)    back pain   Obesity, unspecified    Sleep difficulties    has had a sleep study - t old that she didn't meet criteria for machine    Syncope    due to pain    Objective:  BP 120/74 (BP Location: Left Arm, Patient Position: Sitting, Cuff Size: Normal)   Pulse 92   Temp 97.9 F (36.6 C) (Temporal)   Resp 18   Ht 5' 0.63 (1.54 m)   Wt 232 lb (105.2 kg)   SpO2 95%   BMI 44.37 kg/m  Body mass index is 44.37 kg/m. Physical Exam: GEN: alert, well developed HEENT: clear conjunctiva, nose without rhinorrhea  HEART: regular rate and rhythm, no murmur LUNGS: clear to auscultation bilaterally, no coughing, unlabored respiration SKIN: no rashes or lesions    Assessment:   1. Chronic idiopathic urticaria      Plan/Recommendations:   Idiopathic Urticaria (Hives): - At this time etiology of hives and swelling is unknown. Hives can be caused by a variety of different triggers including stress, illness/infection, exercise, pressure, vibrations, extremes of temperature to name a few however majority of the time there is no identifiable trigger.  - 09/2022: normal tryptase, TSH, CU index, alpha gal  - Continue Zyrtec  10mg  in daytime and 20mg  in evening. Once hive free for several weeks, decrease to Zyrtec  10mg  twice daily.  - If hives worsen, increase to Zyrtec  20mg  twice daily and add Pepcid  20mg  twice daily.   - Use hydroxyzine  10mg  nightly as needed for itching/hives. - Consider Xolair, if hives worsen.     Return in about 6 months (around 08/17/2024).  Arleta Blanch, MD [facility]

## 2024-02-21 ENCOUNTER — Ambulatory Visit: Payer: Self-pay | Admitting: Internal Medicine

## 2024-02-21 ENCOUNTER — Other Ambulatory Visit (HOSPITAL_COMMUNITY): Payer: Self-pay | Admitting: Family Medicine

## 2024-02-21 DIAGNOSIS — Z1231 Encounter for screening mammogram for malignant neoplasm of breast: Secondary | ICD-10-CM

## 2024-02-21 LAB — CMP14+EGFR
ALT: 19 IU/L (ref 0–32)
AST: 18 IU/L (ref 0–40)
Albumin: 3.8 g/dL — ABNORMAL LOW (ref 3.9–4.9)
Alkaline Phosphatase: 168 IU/L — ABNORMAL HIGH (ref 49–135)
BUN/Creatinine Ratio: 17 (ref 12–28)
BUN: 13 mg/dL (ref 8–27)
Bilirubin Total: 0.4 mg/dL (ref 0.0–1.2)
CO2: 22 mmol/L (ref 20–29)
Calcium: 9.3 mg/dL (ref 8.7–10.3)
Chloride: 104 mmol/L (ref 96–106)
Creatinine, Ser: 0.76 mg/dL (ref 0.57–1.00)
Globulin, Total: 2.8 g/dL (ref 1.5–4.5)
Glucose: 86 mg/dL (ref 70–99)
Potassium: 4.5 mmol/L (ref 3.5–5.2)
Sodium: 140 mmol/L (ref 134–144)
Total Protein: 6.6 g/dL (ref 6.0–8.5)
eGFR: 87 mL/min/1.73 (ref >59)

## 2024-02-21 LAB — CBC WITH DIFFERENTIAL/PLATELET
Basophils Absolute: 0 x10E3/uL (ref 0.0–0.2)
Basos: 0 %
EOS (ABSOLUTE): 0.3 x10E3/uL (ref 0.0–0.4)
Eos: 3 %
Hematocrit: 41.8 % (ref 34.0–46.6)
Hemoglobin: 13.3 g/dL (ref 11.1–15.9)
Immature Grans (Abs): 0 x10E3/uL (ref 0.0–0.1)
Immature Granulocytes: 0 %
Lymphocytes Absolute: 2.1 x10E3/uL (ref 0.7–3.1)
Lymphs: 22 %
MCH: 29.3 pg (ref 26.6–33.0)
MCHC: 31.8 g/dL (ref 31.5–35.7)
MCV: 92 fL (ref 79–97)
Monocytes Absolute: 0.8 x10E3/uL (ref 0.1–0.9)
Monocytes: 8 %
Neutrophils Absolute: 6 x10E3/uL (ref 1.4–7.0)
Neutrophils: 67 %
Platelets: 456 x10E3/uL — ABNORMAL HIGH (ref 150–450)
RBC: 4.54 x10E6/uL (ref 3.77–5.28)
RDW: 13.3 % (ref 11.7–15.4)
WBC: 9.2 x10E3/uL (ref 3.4–10.8)

## 2024-02-27 ENCOUNTER — Ambulatory Visit (HOSPITAL_COMMUNITY)
Admission: RE | Admit: 2024-02-27 | Discharge: 2024-02-27 | Disposition: A | Discharge status: Home / Self Care | Source: Ambulatory Visit | Attending: Family Medicine | Admitting: Family Medicine

## 2024-02-27 ENCOUNTER — Encounter (HOSPITAL_COMMUNITY): Payer: Self-pay

## 2024-02-27 ENCOUNTER — Other Ambulatory Visit: Payer: Self-pay | Admitting: Internal Medicine

## 2024-02-27 DIAGNOSIS — Z1231 Encounter for screening mammogram for malignant neoplasm of breast: Secondary | ICD-10-CM | POA: Diagnosis present

## 2024-02-27 DIAGNOSIS — K529 Noninfective gastroenteritis and colitis, unspecified: Secondary | ICD-10-CM

## 2024-03-19 ENCOUNTER — Ambulatory Visit

## 2024-03-19 VITALS — BP 132/63 | Ht 60.0 in | Wt 230.0 lb

## 2024-03-19 DIAGNOSIS — Z0000 Encounter for general adult medical examination without abnormal findings: Secondary | ICD-10-CM | POA: Diagnosis not present

## 2024-03-19 NOTE — Patient Instructions (Signed)
 Ms. Maragh,  Thank you for taking the time for your Medicare Wellness Visit. I appreciate your continued commitment to your health goals. Please review the care plan we discussed, and feel free to reach out if I can assist you further.  Please note that Annual Wellness Visits do not include a physical exam. Some assessments may be limited, especially if the visit was conducted virtually. If needed, we may recommend an in-person follow-up with your provider.  Ongoing Care Seeing your primary care provider every 3 to 6 months helps us  monitor your health and provide consistent, personalized care.   Referrals If a referral was made during today's visit and you haven't received any updates within two weeks, please contact the referred provider directly to check on the status.   Recommended Screenings:  Health Maintenance  Topic Date Due   Pneumococcal Vaccine for age over 72 (2 of 2 - PCV) 01/28/2014   Flu Shot  11/29/2023   COVID-19 Vaccine (7 - 2025-26 season) 12/30/2023   Pap with HPV screening  05/22/2024*   Colon Cancer Screening  12/27/2024   Breast Cancer Screening  02/26/2025   Medicare Annual Wellness Visit  03/19/2025   DTaP/Tdap/Td vaccine (3 - Td or Tdap) 07/26/2025   Hepatitis C Screening  Completed   HIV Screening  Completed   Zoster (Shingles) Vaccine  Completed   Hepatitis B Vaccine  Aged Out   HPV Vaccine  Aged Out   Meningitis B Vaccine  Aged Out  *Topic was postponed. The date shown is not the original due date.       03/19/2024    9:54 AM  Advanced Directives  Does Patient Have a Medical Advance Directive? Yes  Type of Estate Agent of [employer];Living will    Vision: Annual vision screenings are recommended for early detection of glaucoma, cataracts, and diabetic retinopathy. These exams can also reveal signs of chronic conditions such as diabetes and high blood pressure.  Dental: Annual dental screenings help detect early signs of oral  cancer, gum disease, and other conditions linked to overall health, including heart disease and diabetes.  Please see the attached documents for additional preventive care recommendations.

## 2024-03-19 NOTE — Progress Notes (Signed)
 No chief complaint on file.    Subjective:   [name] [name] is a 63 y.o. female who presents for a Medicare Annual Wellness Visit.  Allergies (verified) American cockroach, Gabapentin, and Penicillins   History: Past Medical History:  Diagnosis Date   Allergy 2007   Anxiety 2008   seen in ED /w chest pain but told that she was having anxiety   Arthritis    Colon polyps    Depression 2021   Life change   History of kidney stones 1999   History of migraine    History of stress test 2010   done due to chest pain /w Dr. Ladona. Pt. told that it was wnl.   Hives of unknown origin    told by PCP [facility] [place]) to use Zrytec BID to control hives    Hypertension    Kidney stones    Neuromuscular disorder (HCC)    back pain   Obesity, unspecified    Sleep difficulties    has had a sleep study - t old that she didn't meet criteria for machine    Syncope    due to pain   Past Surgical History:  Procedure Laterality Date   ABDOMINAL HYSTERECTOMY  10/1994   CHOLECYSTECTOMY     COLONOSCOPY  2016   DECOMPRESSIVE LUMBAR LAMINECTOMY LEVEL 1 N/A 10/28/2013   Procedure: DECOMPRESSION L4-5;  Surgeon: Donaciano Sprang, MD;  Location: [facility];  Service: Orthopedics;  Laterality: N/A;   JOINT REPLACEMENT  11/2018&08/2019   Both knee replaced   KNEE ARTHROPLASTY Right 12/24/2018   Procedure: COMPUTER ASSISTED TOTAL KNEE ARTHROPLASTY;  Surgeon: Fidel Rogue, MD;  Location: [facility];  Service: Orthopedics;  Laterality: Right;   KNEE ARTHROPLASTY Left 09/17/2019   Procedure: COMPUTER ASSISTED TOTAL KNEE ARTHROPLASTY;  Surgeon: Fidel Rogue, MD;  Location: [facility];  Service: Orthopedics;  Laterality: Left;   KNEE ARTHROSCOPY  12/2008   left knee   KNEE ARTHROSCOPY Right 01/27/2018   PARTIAL HYSTERECTOMY  10/1994   thumb surgery Right 03/1975   TOTAL HIP ARTHROPLASTY Right 11/28/2023   Procedure: ARTHROPLASTY, HIP, TOTAL, ANTERIOR APPROACH;  Surgeon: Fidel Rogue, MD;  Location: [facility];   Service: Orthopedics;  Laterality: Right;   Family History  Problem Relation Age of Onset   Breast cancer Mother    Stroke Father    Alcohol  abuse Father    Diabetes Other    Hyperlipidemia Other    Heart disease Other    Hypertension Other    Cancer Other    Alcohol  abuse Brother    Cancer Brother    Heart disease Brother    Colon cancer Neg Hx    Esophageal cancer Neg Hx    Stomach cancer Neg Hx    Rectal cancer Neg Hx    Allergic rhinitis Neg Hx    Angioedema Neg Hx    Asthma Neg Hx    Eczema Neg Hx    Urticaria Neg Hx    Social History   Occupational History   Occupation: radiographer, therapeutic  Tobacco Use   Smoking status: Never    Passive exposure: Never   Smokeless tobacco: Never  Vaping Use   Vaping status: Never Used  Substance and Sexual Activity   Alcohol  use: Yes    Comment: rare   Drug use: No   Sexual activity: Not on file   Tobacco Counseling Counseling given: Yes  SDOH Screenings   Food Insecurity: No Food Insecurity (03/19/2024)  Housing: Low Risk  (  03/19/2024)  Transportation Needs: No Transportation Needs (03/19/2024)  Utilities: Not At Risk (03/19/2024)  Alcohol  Screen: Low Risk  (03/19/2024)  Depression (PHQ2-9): Low Risk  (02/05/2024)  Financial Resource Strain: Low Risk  (03/19/2024)  Physical Activity: Inactive (03/19/2024)  Social Connections: Socially Isolated (03/19/2024)  Stress: No Stress Concern Present (03/19/2024)  Tobacco Use: Low Risk  (03/19/2024)  Health Literacy: Adequate Health Literacy (03/19/2024)   See flowsheets for full screening details  Depression Screen PHQ 2 & 9 Depression Scale- Over the past 2 weeks, how often have you been bothered by any of the following problems? Little interest or pleasure in doing things: 0 Feeling down, depressed, or hopeless (PHQ Adolescent also includes...irritable): 0 PHQ-2 Total Score: 0 Trouble falling or staying asleep, or sleeping too much: 0 Feeling tired or having  little energy: 0 Poor appetite or overeating (PHQ Adolescent also includes...weight loss): 0 Feeling bad about yourself - or that you are a failure or have let yourself or your family down: 0 Trouble concentrating on things, such as reading the newspaper or watching television (PHQ Adolescent also includes...like school work): 0 Moving or speaking so slowly that other people could have noticed. Or the opposite - being so fidgety or restless that you have been moving around a lot more than usual: 0 Thoughts that you would be better off dead, or of hurting yourself in some way: 0 PHQ-9 Total Score: 0 If you checked off any problems, how difficult have these problems made it for you to do your work, take care of things at home, or get along with other people?: Not difficult at all  Depression Treatment Depression Interventions/Treatment : EYV7-0 Score <4 Follow-up Not Indicated     Goals Addressed   None    Visit info / Clinical Intake: Medicare Wellness Visit Type:: Subsequent Annual Wellness Visit Persons participating in visit:: patient Medicare Wellness Visit Mode:: Telephone If telephone:: video declined Because this visit was a virtual/telehealth visit:: pt reported vitals If Telephone or Video please confirm:: I connected with the patient using audio enabled telemedicine application and verified that I am speaking with the correct person using two identifiers; I discussed the limitations of evaluation and management by telemedicine; The patient expressed understanding and agreed to proceed Patient Location:: home Provider Location:: office Information given by:: patient Interpreter Needed?: No Pre-visit prep was completed: yes AWV questionnaire completed by patient prior to visit?: yes Date:: 03/19/24 Living arrangements:: (!) lives alone Patient's Overall Health Status Rating: good Typical amount of pain: some Does pain affect daily life?: no Are you currently prescribed  opioids?: no  Dietary Habits and Nutritional Risks How many meals a day?: 3 Eats fruit and vegetables daily?: yes Most meals are obtained by: preparing own meals In the last 2 weeks, have you had any of the following?: none Diabetic:: no  Functional Status Activities of Daily Living (to include ambulation/medication): Independent Ambulation: Independent with device- listed below Home Assistive Devices/Equipment: Cane (s/p THA RT) Medication Administration: Independent Home Management: Needs assistance (comment) (has someone come in to clean) Manage your own finances?: yes Primary transportation is: driving Concerns about vision?: no *vision screening is required for [facility]* Concerns about hearing?: no  Fall Screening Falls in the past year?: 1 Number of falls in past year: 0 Was there an injury with Fall?: 1 Fall Risk Category Calculator: 2 Patient Fall Risk Level: Moderate Fall Risk  Fall Risk Patient at Risk for Falls Due to: History of fall(s); Impaired balance/gait; Impaired mobility; Orthopedic patient  Fall risk Follow up: Falls evaluation completed; Education provided; Falls prevention discussed  Home and Transportation Safety: All rugs have non-skid backing?: N/A, no rugs  Advance Directives (For Healthcare) Does Patient Have a Medical Advance Directive?: Yes Does patient want to make changes to medical advance directive?: No - Patient declined Type of Advance Directive: Healthcare Power of Attorney Copy of Healthcare Power of Attorney in Chart?: No - copy requested [facility]))        Objective:    Today's Vitals   03/19/24 0938  BP: 132/63  Weight: 230 lb (104.3 kg)  Height: 5' (1.524 m)   Body mass index is 44.92 kg/m.  Current Medications (verified) Outpatient Encounter Medications as of 03/19/2024  Medication Sig   atorvastatin  (LIPITOR) 20 MG tablet TAKE 1 TABLET BY MOUTH EVERY DAY   cetirizine  (ZYRTEC ) 10 MG tablet Take 2 tablets  (20 mg total) by mouth 2 (two) times daily as needed for allergies (hives).   diltiazem  (CARDIZEM  CD) 120 MG 24 hr capsule TAKE 1 CAPSULE BY MOUTH EVERY DAY   hydrochlorothiazide  (MICROZIDE ) 12.5 MG capsule TAKE 1 CAPSULE BY MOUTH EVERY DAY   hydrOXYzine  (ATARAX ) 10 MG tablet Take 1 tablet (10 mg total) by mouth at bedtime as needed for itching (or hives).   triamcinolone  ointment (KENALOG ) 0.5 % Apply 1 Application topically 2 (two) times daily.   [DISCONTINUED] famotidine  (PEPCID ) 20 MG tablet Take 1 tablet (20 mg total) by mouth 2 (two) times daily as needed (hives).   [DISCONTINUED] ferrous sulfate  325 (65 FE) MG EC tablet Take 325 mg by mouth in the morning.   [DISCONTINUED] methocarbamol  (ROBAXIN ) 500 MG tablet Take 1 tablet (500 mg total) by mouth every 6 (six) hours as needed for muscle spasms. (Patient not taking: Reported on 02/05/2024)   [DISCONTINUED] Misc Natural Products (BEET ROOT PO) Take 2 tablets by mouth in the morning. Super Beets (Patient not taking: Reported on 02/05/2024)   [DISCONTINUED] Multiple Vitamins-Minerals (HAIR SKIN NAILS PO) Take by mouth.   [DISCONTINUED] omeprazole  (PRILOSEC) 20 MG capsule TAKE 1 CAPSULE BY MOUTH EVERY DAY   [DISCONTINUED] ondansetron  (ZOFRAN -ODT) 8 MG disintegrating tablet Take 1 tablet (8 mg total) by mouth every 8 (eight) hours as needed for nausea or vomiting.   Facility-Administered Encounter Medications as of 03/19/2024  Medication   gadopentetate dimeglumine  (MAGNEVIST ) injection 20 mL   Hearing/Vision screen Hearing Screening - Comments:: Patient denies any hearing difficulties.   Vision Screening - Comments:: Wears rx glasses - up to date with routine eye exams with  [facility] in [place] in [address] [place] Immunizations and Health Maintenance Health Maintenance  Topic Date Due   Pneumococcal Vaccine: 50+ Years (2 of 2 - PCV) 01/28/2014   Influenza Vaccine  11/29/2023   COVID-19 Vaccine (7 - 2025-26 season) 12/30/2023    Medicare Annual Wellness (AWV)  03/12/2024   Cervical Cancer Screening (HPV/Pap Cotest)  05/22/2024 (Originally 06/29/1990)   Colonoscopy  12/27/2024   Mammogram  02/26/2025   DTaP/Tdap/Td (3 - Td or Tdap) 07/26/2025   Hepatitis C Screening  Completed   HIV Screening  Completed   Zoster Vaccines- Shingrix  Completed   Hepatitis B Vaccines 19-59 Average Risk  Aged Out   HPV VACCINES  Aged Out   Meningococcal B Vaccine  Aged Out        Assessment/Plan:  This is a routine wellness examination for [name] [name].  Patient Care Team: Del Wilhelmena Falter, Hilario, FNP as PCP - General (Family Medicine) Faye Domino  J, FNP (Family Medicine) Tobie Arleta SQUIBB, MD as Consulting Physician (Allergy and Immunology) Fidel Rogue, MD as Consulting Physician (Orthopedic Surgery) Burnetta Aures, MD as Consulting Physician (Orthopedic Surgery)  I have personally reviewed and noted the following in the patient's chart:   Medical and social history Use of alcohol , tobacco or illicit drugs  Current medications and supplements including opioid prescriptions. Functional ability and status Nutritional status Physical activity Advanced directives List of other physicians Hospitalizations, surgeries, and ER visits in previous 12 months Vitals Screenings to include cognitive, depression, and falls Referrals and appointments  No orders of the defined types were placed in this encounter.  In addition, I have reviewed and discussed with patient certain preventive protocols, quality metrics, and best practice recommendations. A written personalized care plan for preventive services as well as general preventive health recommendations were provided to patient.   [name] [name], CMA   03/19/2024   No follow-ups on file.  After Visit Summary: (MyChart) Due to this being a telephonic visit, the after visit summary with patients personalized plan was offered to patient via MyChart   Nurse Notes: na

## 2024-03-23 ENCOUNTER — Ambulatory Visit

## 2024-03-23 VITALS — BP 140/80 | HR 98 | Resp 16 | Ht 60.0 in | Wt 233.0 lb

## 2024-03-23 DIAGNOSIS — Z23 Encounter for immunization: Secondary | ICD-10-CM | POA: Diagnosis not present

## 2024-03-23 DIAGNOSIS — I10 Essential (primary) hypertension: Secondary | ICD-10-CM | POA: Diagnosis not present

## 2024-03-23 MED ORDER — HYDROCHLOROTHIAZIDE 12.5 MG PO CAPS: 12.5000 mg | ORAL_CAPSULE | Freq: Every day | ORAL | 3 refills | Status: AC

## 2024-03-23 NOTE — Progress Notes (Unsigned)
 Established Patient Office Visit  Subjective   Patient ID: [name] [name], female    DOB: 1960/10/17  Age: 63 y.o. MRN: [id number]  Chief Complaint  Patient presents with   Medical Management of Chronic Issues    6 MONTH FOLLOW UP     HPI Discussed the use of AI scribe software for clinical note transcription with the patient, who gave verbal consent to proceed.  History of Present Illness    [name] [name] is a 63 year old female who presents for a six-month follow-up after hip surgery.  Postoperative hip pain and dysfunction - Underwent hip surgery earlier this year following initial treatment for back pain in February - Surgical recovery was complicated by persistent symptoms in July and August, including fever, inability to eat, and ongoing malaise - Multiple urgent care visits and courses of antibiotics were required; diagnostic tests, including cultures, were negative for infection - Symptoms resolved after a prolonged course of antibiotics - In September, developed sudden inability to move her leg, associated with swelling and systemic illness, prompting additional urgent care evaluation  Laboratory abnormalities and resolution - Blood work in September revealed abnormalities - Recent laboratory results show improvement: potassium levels are normal, white blood cell count has improved, and liver enzyme levels are better  Back pain and surgical history - History of back surgery in 2015 - Initially attributed symptoms to her back, especially after increased pain during a trip to [place]  in March - Subsequent evaluation determined hip pathology as the source of her symptoms  Hypertension management - Currently taking hydrochlorothiazide  and diltiazem  120 mg - Requires a refill of hydrochlorothiazide  - Monitors blood pressure regularly; blood pressure is frequently elevated in medical settings     Patient Active Problem List   Diagnosis Date Noted   Leukocytosis  02/06/2024   Acute gastroenteritis 02/05/2024   Hypokalemia due to excessive gastrointestinal loss of potassium 02/05/2024   Elevated liver enzymes 02/05/2024   Osteoarthritis of right hip 11/28/2023   S/P total right hip arthroplasty 11/28/2023   Hip pain, chronic, right 11/12/2023   Prediabetes 09/20/2023   DDD (degenerative disc disease), cervical 07/19/2023   Sinusitis 07/19/2023   Iron  deficiency anemia 05/23/2023   Cervical radiculopathy 04/16/2023   Colon cancer screening 08/01/2022   History of colonic polyps 08/01/2022   Abnormal brain MRI 07/02/2017   Insomnia 02/26/2017   Headache disorder 01/24/2017   Vestibular migraine 12/25/2016   Sensorineural hearing loss (SNHL), bilateral 12/25/2016   Vitamin D  deficiency 08/02/2015   HTN (hypertension) 05/25/2015   Syncope 02/17/2015   DDD (degenerative disc disease), lumbar 08/29/2013    ROS    Objective:     BP (!) 140/80 (BP Location: Left Arm, Patient Position: Sitting, Cuff Size: Normal)   Pulse 98   Resp 16   Ht 5' (1.524 m)   Wt 233 lb 0.6 oz (105.7 kg)   SpO2 94%   BMI 45.51 kg/m  BP Readings from Last 3 Encounters:  03/23/24 (!) 140/80  03/19/24 132/63  02/17/24 120/74   Wt Readings from Last 3 Encounters:  03/23/24 233 lb 0.6 oz (105.7 kg)  03/19/24 230 lb (104.3 kg)  02/17/24 232 lb (105.2 kg)      Physical Exam Vitals and nursing note reviewed.  Constitutional:      Appearance: Normal appearance.  HENT:     Head: Normocephalic.     Right Ear: Tympanic membrane, ear canal and external ear normal.     Left Ear:  Tympanic membrane, ear canal and external ear normal.     Nose: Nose normal.     Mouth/Throat:     Mouth: Mucous membranes are moist.     Pharynx: Oropharynx is clear.  Eyes:     Extraocular Movements: Extraocular movements intact.     Pupils: Pupils are equal, round, and reactive to light.  Cardiovascular:     Rate and Rhythm: Normal rate and regular rhythm.  Pulmonary:      Effort: Pulmonary effort is normal.     Breath sounds: Normal breath sounds.  Musculoskeletal:        General: Normal range of motion.     Cervical back: Normal range of motion and neck supple.  Skin:    General: Skin is warm and dry.  Neurological:     Mental Status: She is alert and oriented to person, place, and time.  Psychiatric:        Mood and Affect: Mood normal.        Thought Content: Thought content normal.      No results found for any visits on 03/23/24.    The ASCVD Risk score (Arnett DK, et al., 2019) failed to calculate for the following reasons:   The valid total cholesterol range is 130 to 320 mg/dL    Assessment & Plan:   Problem List Items Addressed This Visit       Cardiovascular and Mediastinum   HTN (hypertension) - Primary   Blood pressure elevated at 136/62 mmHg. On hydrochlorothiazide  and diltiazem  120 mg. Consistent morning readings of 132/62 mmHg. No acute issues. - Refilled hydrochlorothiazide  prescription at [facility] on [address]. - Continue diltiazem  120 mg.      Relevant Medications   hydrochlorothiazide  (MICROZIDE ) 12.5 MG capsule   Other Visit Diagnoses       Encounter for immunization       Relevant Orders   Flu vaccine trivalent PF, 6mos and older(Flulaval,Afluria,Fluarix,Fluzone) (Completed)   Pneumococcal conjugate vaccine 20-valent (Completed)     Need for pneumococcal vaccine       Relevant Orders   Pneumococcal conjugate vaccine 20-valent (Prevnar 20)         Return in about 6 months (around 09/20/2024) for chronic follow-up with PCP.    Leita Longs, FNP

## 2024-03-25 NOTE — Assessment & Plan Note (Signed)
 Blood pressure elevated at 136/62 mmHg. On hydrochlorothiazide  and diltiazem  120 mg. Consistent morning readings of 132/62 mmHg. No acute issues. - Refilled hydrochlorothiazide  prescription at [facility] on [address]. - Continue diltiazem  120 mg.

## 2024-05-23 ENCOUNTER — Other Ambulatory Visit: Payer: Self-pay | Admitting: Family Medicine

## 2024-05-23 DIAGNOSIS — R001 Bradycardia, unspecified: Secondary | ICD-10-CM

## 2024-08-17 ENCOUNTER — Ambulatory Visit: Admitting: Internal Medicine

## 2024-09-22 ENCOUNTER — Ambulatory Visit

## 2025-03-24 ENCOUNTER — Ambulatory Visit
